# Patient Record
Sex: Male | Born: 1965
Health system: Southern US, Community
[De-identification: ages and names within clinical notes are randomized; demographics above are authoritative.]

## PROBLEM LIST (undated history)

## (undated) DIAGNOSIS — E119 Type 2 diabetes mellitus without complications: Secondary | ICD-10-CM

## (undated) DIAGNOSIS — Z0000 Encounter for general adult medical examination without abnormal findings: Secondary | ICD-10-CM

## (undated) DIAGNOSIS — T7840XA Allergy, unspecified, initial encounter: Secondary | ICD-10-CM

## (undated) DIAGNOSIS — Z840 Family history of diseases of the skin and subcutaneous tissue: Secondary | ICD-10-CM

## (undated) DIAGNOSIS — E785 Hyperlipidemia, unspecified: Secondary | ICD-10-CM

## (undated) DIAGNOSIS — M545 Low back pain: Secondary | ICD-10-CM

## (undated) DIAGNOSIS — I10 Essential (primary) hypertension: Secondary | ICD-10-CM

## (undated) DIAGNOSIS — K5190 Ulcerative colitis, unspecified, without complications: Secondary | ICD-10-CM

## (undated) DIAGNOSIS — J329 Chronic sinusitis, unspecified: Secondary | ICD-10-CM

## (undated) DIAGNOSIS — G4700 Insomnia, unspecified: Secondary | ICD-10-CM

## (undated) DIAGNOSIS — M25511 Pain in right shoulder: Secondary | ICD-10-CM

## (undated) DIAGNOSIS — N529 Male erectile dysfunction, unspecified: Secondary | ICD-10-CM

## (undated) DIAGNOSIS — D649 Anemia, unspecified: Secondary | ICD-10-CM

## (undated) HISTORY — DX: Essential (primary) hypertension: I10

## (undated) HISTORY — DX: Anemia, unspecified: D64.9

## (undated) HISTORY — DX: Low back pain: M54.5

## (undated) HISTORY — DX: Pain in right shoulder: M25.511

## (undated) HISTORY — DX: Ulcerative colitis, unspecified, without complications: K51.90

## (undated) HISTORY — DX: Chronic sinusitis, unspecified: J32.9

## (undated) HISTORY — DX: Insomnia, unspecified: G47.00

## (undated) HISTORY — DX: Male erectile dysfunction, unspecified: N52.9

## (undated) HISTORY — DX: Type 2 diabetes mellitus without complications: E11.9

## (undated) HISTORY — DX: Encounter for general adult medical examination without abnormal findings: Z00.00

## (undated) HISTORY — DX: Allergy, unspecified, initial encounter: T78.40XA

## (undated) HISTORY — DX: Hyperlipidemia, unspecified: E78.5

## (undated) HISTORY — DX: Family history of diseases of the skin and subcutaneous tissue: Z84.0

---

## 2006-06-21 LAB — HM COLONOSCOPY

## 2009-09-12 LAB — HM COLONOSCOPY

## 2009-10-15 HISTORY — PX: TRIGGER FINGER RELEASE: SHX641

## 2010-06-30 LAB — HM SIGMOIDOSCOPY

## 2011-11-30 ENCOUNTER — Ambulatory Visit: Payer: Self-pay | Admitting: Family Medicine

## 2012-07-25 ENCOUNTER — Encounter: Payer: Self-pay | Admitting: Family Medicine

## 2012-07-25 ENCOUNTER — Ambulatory Visit (INDEPENDENT_AMBULATORY_CARE_PROVIDER_SITE_OTHER): Payer: Managed Care, Other (non HMO) | Admitting: Family Medicine

## 2012-07-25 VITALS — BP 133/90 | HR 84 | Temp 97.6°F | Ht 66.0 in | Wt 200.8 lb

## 2012-07-25 DIAGNOSIS — E119 Type 2 diabetes mellitus without complications: Secondary | ICD-10-CM

## 2012-07-25 DIAGNOSIS — B354 Tinea corporis: Secondary | ICD-10-CM | POA: Insufficient documentation

## 2012-07-25 DIAGNOSIS — K5190 Ulcerative colitis, unspecified, without complications: Secondary | ICD-10-CM

## 2012-07-25 DIAGNOSIS — N529 Male erectile dysfunction, unspecified: Secondary | ICD-10-CM | POA: Insufficient documentation

## 2012-07-25 DIAGNOSIS — Z840 Family history of diseases of the skin and subcutaneous tissue: Secondary | ICD-10-CM

## 2012-07-25 DIAGNOSIS — E785 Hyperlipidemia, unspecified: Secondary | ICD-10-CM | POA: Insufficient documentation

## 2012-07-25 DIAGNOSIS — I10 Essential (primary) hypertension: Secondary | ICD-10-CM

## 2012-07-25 DIAGNOSIS — Z831 Family history of other infectious and parasitic diseases: Secondary | ICD-10-CM

## 2012-07-25 HISTORY — DX: Family history of diseases of the skin and subcutaneous tissue: Z84.0

## 2012-07-25 HISTORY — DX: Family history of other infectious and parasitic diseases: Z83.1

## 2012-07-25 MED ORDER — KETOCONAZOLE 2 % EX SHAM: MEDICATED_SHAMPOO | CUTANEOUS | Status: DC

## 2012-07-25 MED ORDER — MICONAZOLE NITRATE 2 % EX CREA: TOPICAL_CREAM | Freq: Two times a day (BID) | CUTANEOUS | Status: DC

## 2012-07-25 NOTE — Assessment & Plan Note (Signed)
Numbers are up slightly this week in the 130 and 140s, he is encouraged to avoid simple carbs this week and lean on proteins more til numbers start declining

## 2012-07-25 NOTE — Assessment & Plan Note (Signed)
Wash with Nizoral shampoo twice weekly, Miconazole derm cream apply bid til gone, may increase Fexofenadine to bid and use Benadryl qhs for the itching. May use Witch Hazel Astringent prn call if worse

## 2012-07-25 NOTE — Assessment & Plan Note (Signed)
Adequately controlled, no concerns

## 2012-07-25 NOTE — Progress Notes (Signed)
Patient ID: Curtis Kennedy, male   DOB: 30-May-1966, 46 y.o.   MRN: [id number] Curtis Kennedy [id number] [birth year] 07/25/2012      Progress Note-Follow Up  Subjective  Chief Complaint  Chief Complaint  Patient presents with  . Rash    on chest and shoulders X 2 weeks- slight itch    HPI  Patient is a 46 year old Caucasian male who is in today for urgent new patient appointment. He was scheduled to start with this practice next week but he has had a rash which continues to worsen. It again about 2 weeks ago. It started on his upper chest and back of his neck. Now it has spread onto his abdominal wall his arms he does note it occurred in any new products or change in medications and he notes when he gets hot or takes a shower it gets redder and more itchy. He has tried some antihistamines and it is not improving. Never had a similar rash before. No one in his home has a similar rash. He notes that his blood sugars are and typically he he runs 110-120 and running in the 130s and 140s.  No polyuria or polydipsia. He does have ulcerative colitis and is on Imuran without flare since this started  Past Medical History  Diagnosis Date  . Erectile dysfunction   . Diabetes mellitus without complication 46 yrs old    type 2  . Hypertension   . Hyperlipidemia   . Colitis, ulcerative   . Family history of tinea corporis 07/25/2012    Past Surgical History  Procedure Date  . Trigger finger release 2011    History reviewed. No pertinent family history.  History   Social History  . Marital Status: Married    Spouse Name: N/A    Number of Children: N/A  . Years of Education: N/A   Occupational History  . Not on file.   Social History Main Topics  . Smoking status: Never Smoker   . Smokeless tobacco: Never Used  . Alcohol Use: No  . Drug Use: No  . Sexually Active: Yes -- Male partner(s)   Other Topics Concern  . Not on file   Social History Narrative  . No narrative on  file    Current Outpatient Prescriptions on File Prior to Visit  Medication Sig Dispense Refill  . balsalazide (COLAZAL) 750 MG capsule Take 2,250 mg by mouth 3 (three) times daily.      . diphenhydrAMINE (BENADRYL) 12.5 MG/5ML elixir Take by mouth as needed.      . fenofibrate 160 MG tablet Take 160 mg by mouth daily.      Marland Kitchen glimepiride (AMARYL) 4 MG tablet Take 4 mg by mouth daily before breakfast. Take 1.5 tab      . losartan (COZAAR) 50 MG tablet Take 50 mg by mouth daily.      . metFORMIN (GLUCOPHAGE) 500 MG tablet Take 1,000 mg by mouth 2 (two) times daily with a meal.      . metoprolol succinate (TOPROL-XL) 50 MG 24 hr tablet Take 50 mg by mouth daily. Take with or immediately following a meal.      . rosuvastatin (CRESTOR) 10 MG tablet Take 10 mg by mouth daily.      . saxagliptin HCl (ONGLYZA) 5 MG TABS tablet Take 5 mg by mouth daily.      . tadalafil (CIALIS) 5 MG tablet Take 5 mg by mouth daily as needed.      . zaleplon (  SONATA) 10 MG capsule Take 10 mg by mouth at bedtime.        No Known Allergies  Review of Systems  Review of Systems  Constitutional: Positive for malaise/fatigue. Negative for fever.  HENT: Negative for congestion.   Eyes: Negative for discharge.  Respiratory: Negative for shortness of breath.   Cardiovascular: Negative for chest pain, palpitations and leg swelling.  Gastrointestinal: Negative for nausea, abdominal pain and diarrhea.  Genitourinary: Negative for dysuria.  Musculoskeletal: Negative for falls.  Skin: Positive for itching and rash.       Chest and back, itching and spreading  Neurological: Negative for loss of consciousness and headaches.  Endo/Heme/Allergies: Negative for polydipsia.  Psychiatric/Behavioral: Negative for depression and suicidal ideas. The patient is not nervous/anxious and does not have insomnia.     Objective  BP 133/90  Pulse 84  Temp 97.6 F (36.4 C) (Temporal)  Ht 5' 6"  (1.676 m)  Wt 200 lb 12.8 oz  (91.082 kg)  BMI 32.41 kg/m2  SpO2 99%  Physical Exam  Physical Exam  Constitutional: He is oriented to person, place, and time and well-developed, well-nourished, and in no distress. No distress.  HENT:  Head: Normocephalic and atraumatic.  Eyes: Conjunctivae normal are normal.  Neck: Neck supple. No thyromegaly present.  Cardiovascular: Normal rate, regular rhythm and normal heart sounds.   No murmur heard. Pulmonary/Chest: Effort normal and breath sounds normal. No respiratory distress.  Abdominal: He exhibits no distension and no mass. There is no tenderness.  Musculoskeletal: He exhibits no edema.  Neurological: He is alert and oriented to person, place, and time.  Skin: Skin is warm. Rash noted. There is erythema.          Scaly, raised rash on shoulders, chest and back,  Psychiatric: Memory, affect and judgment normal.      Assessment & Plan  Tinea corporis Wash with Nizoral shampoo twice weekly, Miconazole derm cream apply bid til gone, may increase Fexofenadine to bid and use Benadryl qhs for the itching. May use Witch Hazel Astringent prn call if worse  Hypertension Adequately controlled, no concerns  Diabetes mellitus without complication Numbers are up slightly this week in the 130 and 140s, he is encouraged to avoid simple carbs this week and lean on proteins more til numbers start declining  Colitis, ulcerative Is on Imuran for immune suppression

## 2012-07-25 NOTE — Patient Instructions (Addendum)
Ringworm, Body [Tinea Corporis] Ringworm is a fungal infection of the skin and hair. Another name for this problem is Tinea Corporis. It has nothing to do with worms. A fungus is an organism that lives on dead cells (the outer layer of skin). It can involve the entire body. It can spread from infected pets. Tinea corporis can be a problem in wrestlers who may get the infection form other players/opponents, equipment and mats. DIAGNOSIS  A skin scraping can be obtained from the affected area and by looking for fungus under the microscope. This is called a KOH examination.  HOME CARE INSTRUCTIONS   Ringworm may be treated with a topical antifungal cream, ointment, or oral medications.  If you are using a cream or ointment, wash infected skin. Dry it completely before application.  Scrub the skin with a buff puff or abrasive sponge using a shampoo with ketoconazole to remove dead skin and help treat the ringworm.  Have your pet treated by your veterinarian if it has the same infection. SEEK MEDICAL CARE IF:   Your ringworm patch (fungus) continues to spread after 7 days of treatment.  Your rash is not gone in 4 weeks. Fungal infections are slow to respond to treatment. Some redness (erythema) may remain for several weeks after the fungus is gone.  The area becomes red, warm, tender, and swollen beyond the patch. This may be a secondary bacterial (germ) infection.  You have a fever. Document Released: 09/28/2000 Document Revised: 12/24/2011 Document Reviewed: 03/11/2009 [facility] Patient Information 2013 [place].

## 2012-07-27 NOTE — Assessment & Plan Note (Signed)
Is on Imuran for immune suppression

## 2012-08-08 ENCOUNTER — Ambulatory Visit: Payer: Managed Care, Other (non HMO) | Admitting: Family Medicine

## 2012-08-21 ENCOUNTER — Encounter: Payer: Self-pay | Admitting: Family Medicine

## 2012-08-21 ENCOUNTER — Ambulatory Visit (INDEPENDENT_AMBULATORY_CARE_PROVIDER_SITE_OTHER): Payer: Managed Care, Other (non HMO) | Admitting: Family Medicine

## 2012-08-21 VITALS — BP 116/75 | HR 75 | Ht 66.0 in | Wt 200.0 lb

## 2012-08-21 DIAGNOSIS — E785 Hyperlipidemia, unspecified: Secondary | ICD-10-CM

## 2012-08-21 DIAGNOSIS — I10 Essential (primary) hypertension: Secondary | ICD-10-CM

## 2012-08-21 DIAGNOSIS — K5190 Ulcerative colitis, unspecified, without complications: Secondary | ICD-10-CM

## 2012-08-21 DIAGNOSIS — E782 Mixed hyperlipidemia: Secondary | ICD-10-CM

## 2012-08-21 DIAGNOSIS — Z23 Encounter for immunization: Secondary | ICD-10-CM

## 2012-08-21 DIAGNOSIS — E119 Type 2 diabetes mellitus without complications: Secondary | ICD-10-CM

## 2012-08-21 DIAGNOSIS — B354 Tinea corporis: Secondary | ICD-10-CM

## 2012-08-21 LAB — CBC WITH DIFFERENTIAL/PLATELET
Basophils Absolute: 0 10*3/uL (ref 0.0–0.1)
Eosinophils Absolute: 0.1 10*3/uL (ref 0.0–0.7)
Eosinophils Relative: 1.5 % (ref 0.0–5.0)
Lymphs Abs: 0.9 10*3/uL (ref 0.7–4.0)
MCHC: 33.8 g/dL (ref 30.0–36.0)
MCV: 95.6 fl (ref 78.0–100.0)
Monocytes Absolute: 0.3 10*3/uL (ref 0.1–1.0)
Neutrophils Relative %: 68.7 % (ref 43.0–77.0)
Platelets: 250 10*3/uL (ref 150.0–400.0)
RDW: 15.1 % — ABNORMAL HIGH (ref 11.5–14.6)
WBC: 4.2 10*3/uL — ABNORMAL LOW (ref 4.5–10.5)

## 2012-08-21 LAB — COMPREHENSIVE METABOLIC PANEL
ALT: 50 U/L (ref 0–53)
Albumin: 4.2 g/dL (ref 3.5–5.2)
CO2: 23 mEq/L (ref 19–32)
Chloride: 105 mEq/L (ref 96–112)
GFR: 94.57 mL/min (ref >60.00)
Glucose, Bld: 234 mg/dL — ABNORMAL HIGH (ref 70–99)
Potassium: 4.4 mEq/L (ref 3.5–5.1)
Sodium: 137 mEq/L (ref 135–145)
Total Protein: 6.9 g/dL (ref 6.0–8.3)

## 2012-08-21 LAB — LIPID PANEL: Total CHOL/HDL Ratio: 4

## 2012-08-21 LAB — TSH: TSH: 1.17 u[IU]/mL (ref 0.35–5.50)

## 2012-08-21 LAB — HEMOGLOBIN A1C: Hgb A1c MFr Bld: 9.4 % — ABNORMAL HIGH (ref 4.6–6.5)

## 2012-08-21 NOTE — Progress Notes (Signed)
Office Note 08/24/2012  CC:  Chief Complaint  Patient presents with  . Establish Care    HPI:  Curtis Kennedy is a 46 y.o. White male who is here to establish care.  He was seen for an acute visit by Dr. Charlett Blake for rash, so this is not exactly his initial visit. Patient's most recent primary MD: Dr. Melford Aase. Old records were not reviewed prior to or during today's visit.  Patient reports feeling well, no acute complaints.  Fasting gluc's 130s-140s, 2 H PP 160s.  He is vague on how often he checks his sugar.   BP checks outside of the office have been normal per his report.   Compliant with all meds.    Pt says last labs were about 6 mo ago.  He is fasting today.   Past Medical History  Diagnosis Date  . Erectile dysfunction   . Diabetes mellitus without complication 46 yrs old    type 2 (dx'd after long courses of prednisone for his UC)  . Hypertension   . Hyperlipidemia   . Colitis, ulcerative Dx'd 2001    Dr. Erlene Quan; [facility]--has been in remission since about 2009  . Family history of tinea corporis 07/25/2012    Past Surgical History  Procedure Date  . Trigger finger release 2011    No family history on file.  History   Social History  . Marital Status: Married    Spouse Name: N/A    Number of Children: N/A  . Years of Education: N/A   Occupational History  . Not on file.   Social History Main Topics  . Smoking status: Never Smoker   . Smokeless tobacco: Never Used  . Alcohol Use: No  . Drug Use: No  . Sexually Active: Yes -- Male partner(s)   Other Topics Concern  . Not on file   Social History Narrative   Married, 48 y/o girl.  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    Outpatient Encounter Prescriptions as of 08/21/2012  Medication Sig Dispense Refill  . azaTHIOprine (IMURAN) 50 MG tablet Take 50 mg by mouth  daily. Take 4.5 tabs daily      . balsalazide (COLAZAL) 750 MG capsule Take 2,250 mg by mouth 3 (three) times daily.      . fenofibrate 160 MG tablet Take 160 mg by mouth daily.      Marland Kitchen glimepiride (AMARYL) 4 MG tablet Take 4 mg by mouth daily before breakfast. Take 1.5 tab      . losartan (COZAAR) 50 MG tablet Take 50 mg by mouth daily.      . meloxicam (MOBIC) 15 MG tablet Take 15 mg by mouth daily.      . metFORMIN (GLUCOPHAGE) 500 MG tablet Take 1,000 mg by mouth 2 (two) times daily with a meal.      . metoprolol succinate (TOPROL-XL) 50 MG 24 hr tablet Take 50 mg by mouth daily. Take with or immediately following a meal.      . rosuvastatin (CRESTOR) 10 MG tablet Take 10 mg by mouth daily.      . saxagliptin HCl (ONGLYZA) 5 MG TABS tablet Take 5 mg by mouth daily.      . tadalafil (CIALIS) 5 MG tablet Take 5 mg by mouth daily as needed.      . zaleplon (SONATA) 10 MG capsule Take 10 mg by  mouth at bedtime.      . [DISCONTINUED] diphenhydrAMINE (BENADRYL) 12.5 MG/5ML elixir Take by mouth as needed.      . [DISCONTINUED] ketoconazole (NIZORAL) 2 % shampoo Apply topically 2 (two) times a week.  120 mL  0  . [DISCONTINUED] miconazole (MICOTIN) 2 % cream Apply topically 2 (two) times daily.  28.35 g  1    No Known Allergies  ROS Review of Systems  Constitutional: Negative for weight loss.  HENT: Negative for congestion and sore throat.   Eyes: Negative for pain.  Respiratory: Negative for cough and hemoptysis.   Cardiovascular: Negative for chest pain, palpitations and PND.  Gastrointestinal: Negative for nausea, vomiting and diarrhea.  Genitourinary: Negative for urgency and hematuria.  Musculoskeletal: Negative for myalgias and back pain.  Skin: Negative for rash.  Neurological: Negative for dizziness, tremors, seizures, weakness and headaches.  Endo/Heme/Allergies: Negative for polydipsia. Does not bruise/bleed easily.  Psychiatric/Behavioral: Negative for depression. The patient is  not nervous/anxious.     PE; Blood pressure 116/75, pulse 75, height 5' 6"  (1.676 m), weight 200 lb (90.719 kg), SpO2 98.00%. Gen: Alert, well appearing.  Patient is oriented to person, place, time, and situation. RRR, no m/r/g Lungs: CTA bilat, nonlabored. SKIN: upper chest wall with diffuse superficial desquamation/peeling.  NO significant erythema.  NO excoriations. FEET: DP and PT pulses 2+, no c/c/e.  Nails normal.  No significant calluses, no erythema or skin breakdown.  No sensory deficit with monofilament testing bilaterally.  Pertinent labs:  None today  ASSESSMENT AND PLAN:   New Pt: obtain old records.  Diabetes mellitus without complication Control not ideal, particularly fastings.  Check HbA1c today, normal diabetic foot exam today. Continue current med for now.  Hypertension Problem stable.  Continue current medications and diet appropriate for this condition.  We have reviewed our general long term plan for this problem and also reviewed symptoms and signs that should prompt the patient to call or return to the office. Lytes//Cr today.  Hyperlipidemia Problem stable.  Continue current medications and diet appropriate for this condition.  We have reviewed our general long term plan for this problem and also reviewed symptoms and signs that should prompt the patient to call or return to the office. Check FLP and AST/ALT.  Continue current meds.  Tinea corporis Improving with topical antifungal. Continue topical antifungal OTC bid until rash is gone x 3d.  Colitis, ulcerative Quiescent. Continue current meds and appropriate GI follow up.   Flu vaccine IM today.  An After Visit Summary was printed and given to the patient.  Return in about 4 months (around 12/19/2012) for f/u DM 2, HTN, Hyperlip.

## 2012-08-21 NOTE — Patient Instructions (Signed)
Buy OTC generic lamisil cream to apply to your rash twice daily until rash is gone for 3 days.

## 2012-08-24 NOTE — Assessment & Plan Note (Addendum)
Control not ideal, particularly fastings.  Check HbA1c today, normal diabetic foot exam today. Continue current med for now.

## 2012-08-24 NOTE — Assessment & Plan Note (Signed)
Quiescent. Continue current meds and appropriate GI follow up.

## 2012-08-24 NOTE — Assessment & Plan Note (Signed)
Problem stable.  Continue current medications and diet appropriate for this condition.  We have reviewed our general long term plan for this problem and also reviewed symptoms and signs that should prompt the patient to call or return to the office. Lytes//Cr today.

## 2012-08-24 NOTE — Assessment & Plan Note (Signed)
Problem stable.  Continue current medications and diet appropriate for this condition.  We have reviewed our general long term plan for this problem and also reviewed symptoms and signs that should prompt the patient to call or return to the office. Check FLP and AST/ALT.  Continue current meds.

## 2012-08-24 NOTE — Assessment & Plan Note (Signed)
Improving with topical antifungal. Continue topical antifungal OTC bid until rash is gone x 3d.

## 2012-08-26 ENCOUNTER — Other Ambulatory Visit: Payer: Self-pay | Admitting: *Deleted

## 2012-08-26 DIAGNOSIS — B354 Tinea corporis: Secondary | ICD-10-CM

## 2012-08-26 MED ORDER — MICONAZOLE NITRATE 2 % EX CREA: TOPICAL_CREAM | Freq: Two times a day (BID) | CUTANEOUS | Status: DC

## 2012-08-26 NOTE — Telephone Encounter (Signed)
Faxed refill request received from pharmacy for [place] filled by MD on 07/25/12 Last seen on 08/21/12 Tinea corporis  Improving with topical antifungal.  Continue topical antifungal OTC bid until rash is gone x 3d.  Message left with wife to return call to check status of rash.  RX sent.

## 2012-09-02 NOTE — Telephone Encounter (Signed)
No return call from patient.  Ending follow up.  I will enquire about rash when patient returns call regarding labs.

## 2012-09-16 ENCOUNTER — Encounter: Payer: Self-pay | Admitting: *Deleted

## 2012-12-03 ENCOUNTER — Ambulatory Visit (INDEPENDENT_AMBULATORY_CARE_PROVIDER_SITE_OTHER): Payer: Managed Care, Other (non HMO) | Admitting: Ophthalmology

## 2012-12-03 DIAGNOSIS — E1139 Type 2 diabetes mellitus with other diabetic ophthalmic complication: Secondary | ICD-10-CM

## 2012-12-03 DIAGNOSIS — H35039 Hypertensive retinopathy, unspecified eye: Secondary | ICD-10-CM

## 2012-12-03 DIAGNOSIS — H2510 Age-related nuclear cataract, unspecified eye: Secondary | ICD-10-CM

## 2012-12-03 DIAGNOSIS — E1165 Type 2 diabetes mellitus with hyperglycemia: Secondary | ICD-10-CM

## 2012-12-03 DIAGNOSIS — I10 Essential (primary) hypertension: Secondary | ICD-10-CM

## 2012-12-03 DIAGNOSIS — E11319 Type 2 diabetes mellitus with unspecified diabetic retinopathy without macular edema: Secondary | ICD-10-CM

## 2012-12-19 ENCOUNTER — Ambulatory Visit: Payer: Managed Care, Other (non HMO) | Admitting: Family Medicine

## 2013-12-07 ENCOUNTER — Ambulatory Visit (INDEPENDENT_AMBULATORY_CARE_PROVIDER_SITE_OTHER): Payer: Self-pay | Admitting: Ophthalmology

## 2013-12-07 ENCOUNTER — Ambulatory Visit (INDEPENDENT_AMBULATORY_CARE_PROVIDER_SITE_OTHER): Payer: Managed Care, Other (non HMO) | Admitting: Ophthalmology

## 2014-12-24 ENCOUNTER — Ambulatory Visit: Payer: Managed Care, Other (non HMO) | Admitting: Family Medicine

## 2015-04-14 ENCOUNTER — Ambulatory Visit: Payer: Managed Care, Other (non HMO) | Admitting: Family Medicine

## 2015-04-21 ENCOUNTER — Telehealth: Payer: Self-pay

## 2015-04-21 NOTE — Telephone Encounter (Signed)
Attempted pre visit call, pt unavailable. Will be at appointment tomorrow

## 2015-04-22 ENCOUNTER — Encounter: Payer: Self-pay | Admitting: Family Medicine

## 2015-04-22 ENCOUNTER — Ambulatory Visit (INDEPENDENT_AMBULATORY_CARE_PROVIDER_SITE_OTHER): Payer: Managed Care, Other (non HMO) | Admitting: Family Medicine

## 2015-04-22 ENCOUNTER — Other Ambulatory Visit: Payer: Self-pay | Admitting: Family Medicine

## 2015-04-22 VITALS — BP 110/62 | HR 83 | Temp 98.6°F | Ht 66.0 in | Wt 178.1 lb

## 2015-04-22 DIAGNOSIS — E119 Type 2 diabetes mellitus without complications: Secondary | ICD-10-CM

## 2015-04-22 DIAGNOSIS — Z1211 Encounter for screening for malignant neoplasm of colon: Secondary | ICD-10-CM

## 2015-04-22 DIAGNOSIS — E785 Hyperlipidemia, unspecified: Secondary | ICD-10-CM

## 2015-04-22 DIAGNOSIS — I10 Essential (primary) hypertension: Secondary | ICD-10-CM

## 2015-04-22 DIAGNOSIS — R350 Frequency of micturition: Secondary | ICD-10-CM

## 2015-04-22 DIAGNOSIS — G4700 Insomnia, unspecified: Secondary | ICD-10-CM | POA: Insufficient documentation

## 2015-04-22 DIAGNOSIS — E118 Type 2 diabetes mellitus with unspecified complications: Secondary | ICD-10-CM

## 2015-04-22 LAB — LIPID PANEL
CHOL/HDL RATIO: 4
CHOLESTEROL: 146 mg/dL (ref 0–200)
HDL: 38.5 mg/dL — ABNORMAL LOW (ref >39.00)
LDL Cholesterol: 82 mg/dL (ref 0–99)
NonHDL: 107.5
TRIGLYCERIDES: 128 mg/dL (ref 0.0–149.0)
VLDL: 25.6 mg/dL (ref 0.0–40.0)

## 2015-04-22 LAB — CBC
HCT: 42 % (ref 39.0–52.0)
HEMOGLOBIN: 14.4 g/dL (ref 13.0–17.0)
MCHC: 34.4 g/dL (ref 30.0–36.0)
MCV: 93.5 fl (ref 78.0–100.0)
Platelets: 288 10*3/uL (ref 150.0–400.0)
RBC: 4.5 Mil/uL (ref 4.22–5.81)
RDW: 13.3 % (ref 11.5–15.5)
WBC: 4.9 10*3/uL (ref 4.0–10.5)

## 2015-04-22 LAB — COMPREHENSIVE METABOLIC PANEL
ALK PHOS: 79 U/L (ref 39–117)
ALT: 46 U/L (ref 0–53)
AST: 39 U/L — ABNORMAL HIGH (ref 0–37)
Albumin: 4.2 g/dL (ref 3.5–5.2)
BILIRUBIN TOTAL: 0.6 mg/dL (ref 0.2–1.2)
BUN: 11 mg/dL (ref 6–23)
CO2: 24 mEq/L (ref 19–32)
Calcium: 9.7 mg/dL (ref 8.4–10.5)
Chloride: 104 mEq/L (ref 96–112)
Creatinine, Ser: 0.82 mg/dL (ref 0.40–1.50)
GFR: 105.48 mL/min (ref >60.00)
Glucose, Bld: 359 mg/dL — ABNORMAL HIGH (ref 70–99)
Potassium: 4.3 mEq/L (ref 3.5–5.1)
SODIUM: 137 meq/L (ref 135–145)
TOTAL PROTEIN: 7.2 g/dL (ref 6.0–8.3)

## 2015-04-22 LAB — HEMOGLOBIN A1C: Hgb A1c MFr Bld: 10.2 % — ABNORMAL HIGH (ref 4.6–6.5)

## 2015-04-22 LAB — MICROALBUMIN / CREATININE URINE RATIO
CREATININE, U: 47.3 mg/dL
MICROALB UR: 3.7 mg/dL — AB (ref 0.0–1.9)
MICROALB/CREAT RATIO: 7.8 mg/g (ref 0.0–30.0)

## 2015-04-22 LAB — PSA: PSA: 0.87 ng/mL (ref 0.10–4.00)

## 2015-04-22 LAB — TSH: TSH: 1.24 u[IU]/mL (ref 0.35–4.50)

## 2015-04-22 MED ORDER — GLIMEPIRIDE 4 MG PO TABS: 4.0000 mg | ORAL_TABLET | Freq: Two times a day (BID) | ORAL | Status: DC

## 2015-04-22 MED ORDER — ALPRAZOLAM 0.5 MG PO TABS: 0.5000 mg | ORAL_TABLET | Freq: Two times a day (BID) | ORAL | Status: DC | PRN

## 2015-04-22 NOTE — Assessment & Plan Note (Signed)
Tolerating statin, encouraged heart healthy diet, avoid trans fats, minimize simple carbs and saturated fats. Increase exercise as tolerated 

## 2015-04-22 NOTE — Assessment & Plan Note (Signed)
recheck hgba1c today.  Request old records. Referred to Dr Cruzita Lederer, endocrinology

## 2015-04-22 NOTE — Assessment & Plan Note (Signed)
No flares for past 4 1/2 years but has also not seen gastroenterology for years. Has previously seen [facility] in [place] but would like to switch to [facility], will place referral today.

## 2015-04-22 NOTE — Assessment & Plan Note (Signed)
Well controlled, no changes to meds. Encouraged heart healthy diet such as the DASH diet and exercise as tolerated.  °

## 2015-04-22 NOTE — Patient Instructions (Signed)

## 2015-04-22 NOTE — Assessment & Plan Note (Signed)
Uses Sonata on a typical night and Xanax only when he is overly stressed at work and his mind will not quiet down. Encouraged good sleep hygiene such as dark, quiet room. No blue/green glowing lights such as computer screens in bedroom. No alcohol or stimulants in evening. Cut down on caffeine as able. Regular exercise is helpful but not just prior to bed time.

## 2015-04-22 NOTE — Progress Notes (Signed)
Pre visit review using our clinic review tool, if applicable. No additional management support is needed unless otherwise documented below in the visit note. 

## 2015-04-25 ENCOUNTER — Encounter: Payer: Self-pay | Admitting: Family Medicine

## 2015-04-26 ENCOUNTER — Ambulatory Visit: Payer: Managed Care, Other (non HMO) | Admitting: Physician Assistant

## 2015-04-28 ENCOUNTER — Ambulatory Visit (INDEPENDENT_AMBULATORY_CARE_PROVIDER_SITE_OTHER): Payer: Managed Care, Other (non HMO) | Admitting: Medical

## 2015-04-28 ENCOUNTER — Encounter: Payer: Self-pay | Admitting: Medical

## 2015-04-28 ENCOUNTER — Other Ambulatory Visit: Payer: Self-pay | Admitting: Medical

## 2015-04-28 VITALS — BP 132/81 | HR 88 | Temp 97.8°F | Ht 66.0 in | Wt 180.8 lb

## 2015-04-28 DIAGNOSIS — R2230 Localized swelling, mass and lump, unspecified upper limb: Secondary | ICD-10-CM | POA: Insufficient documentation

## 2015-04-28 DIAGNOSIS — R2231 Localized swelling, mass and lump, right upper limb: Secondary | ICD-10-CM

## 2015-04-28 NOTE — Patient Instructions (Addendum)
Axillary mass I called our radiology service and we can't do that studies here. Will need to be at breast center. Both mammogram and rt axillary [place]. This is the protocol per scheduling.  Our referral staff will contact you on procedure date.  Cbc may be done later again in event lymph node found and mammogram negative.     Follow up here as needed before if any changing signs or symptoms.

## 2015-04-28 NOTE — Assessment & Plan Note (Addendum)
I called our radiology service and we can't do that studies here. Will need to be at breast center. Both mammogram and rt axillary [place]. This is the protocol per scheduling.  Our referral staff will contact you on procedure date.  Cbc may be done later again in event lymph node found and mammogram negative.

## 2015-04-28 NOTE — Progress Notes (Signed)
Pre visit review using our clinic review tool, if applicable. No additional management support is needed unless otherwise documented below in the visit note. 

## 2015-04-28 NOTE — Progress Notes (Signed)
Subjective:    Patient ID: Curtis Kennedy, male    DOB: [birth year], 49 y.o.   MRN: [id number]  HPI  Pt states just this past Friday he was showering and felt lump/mass under rt axillary area. Hurts just little bit. No fever, no chills, no sweats. No rash. No regional source of infection reported.  Pt states/suspects this is new since never felt before.  Purposeful weight loss about 60 pounds in 2 yrs. Last year maybe 40 lb purposeful weight loss. So pt think maybe fat in area prevented him from feeling this area in question.      Review of Systems  Constitutional: Negative for fever, chills and fatigue.  HENT: Negative for congestion, dental problem, postnasal drip and rhinorrhea.   Respiratory: Negative for cough, choking, chest tightness, shortness of breath and wheezing.   Cardiovascular: Negative for chest pain and palpitations.  Gastrointestinal: Negative for abdominal pain.  Neurological: Negative for dizziness, light-headedness and numbness.  Hematological: Negative for adenopathy. Does not bruise/bleed easily.       Maybe lymph node rt axillary area.   Past Medical History  Diagnosis Date  . Erectile dysfunction   . Diabetes mellitus without complication 49 yrs old    type 2 (dx'd after long courses of prednisone for his UC)  . Hypertension   . Hyperlipidemia   . Colitis, ulcerative Dx'd 2001    Dr. Erlene Quan; [facility]--has been in remission since about 2009  . Family history of tinea corporis 07/25/2012  . Insomnia     History   Social History  . Marital Status: Married    Spouse Name: N/A  . Number of Children: N/A  . Years of Education: N/A   Occupational History  . Not on file.   Social History Main Topics  . Smoking status: Never Smoker   . Smokeless tobacco: Never Used  . Alcohol Use: No  . Drug Use: No  . 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 Not on file    Social History Narrative   Married, 51 y/o girl.     Lives in [place], grew up in [place]--relocated to [facility] 2007.   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          Past Surgical History  Procedure Laterality Date  . Trigger finger release  2011    b/l middle fingers    Family History  Problem Relation Age of Onset  . Diabetes Father   . Hyperlipidemia Father   . Hypertension Father   . Kidney disease Father     Kidney Failure  . Heart disease Father 92    MI  . Hypertension Sister   . Hypertension Brother   . Diabetes Paternal Grandfather   . Hypertension Brother   . Hyperlipidemia Brother   . Heart disease Brother     Valve Surgery ?  . Diabetes Brother   . Hypertension Brother   . ADD / ADHD Brother     Allergies  Allergen Reactions  . Simvastatin     Myalgias    Current Outpatient Prescriptions on File Prior to Visit  Medication Sig Dispense Refill  . ALPRAZolam (XANAX) 0.5 MG tablet Take 1 tablet (0.5 mg total) by mouth 2 (two) times daily as needed for anxiety. 60 tablet 0  .  azaTHIOprine (IMURAN) 50 MG tablet Take 50 mg by mouth daily. Take 4.5 tabs daily    . balsalazide (COLAZAL) 750 MG capsule Take 2,250 mg by mouth 3 (three) times daily.    . busPIRone (BUSPAR) 5 MG tablet Take 5 mg by mouth 2 (two) times daily.    . Dulaglutide (TRULICITY) 1.5 BW/6.2MB SOPN Inject 1.5 mLs into the skin once a week.    . fenofibrate 160 MG tablet Take 160 mg by mouth daily.    Marland Kitchen glimepiride (AMARYL) 4 MG tablet Take 1 tablet (4 mg total) by mouth 2 (two) times daily. 60 tablet 3  . losartan (COZAAR) 50 MG tablet Take 50 mg by mouth daily.    . meloxicam (MOBIC) 15 MG tablet Take 15 mg by mouth daily.    . metFORMIN (GLUCOPHAGE) 500 MG tablet Take 1,000 mg by mouth 2 (two) times daily with a meal.    . metoprolol succinate (TOPROL-XL) 50 MG 24 hr tablet Take 50 mg by mouth daily. Take with or  immediately following a meal.    . Pitavastatin Calcium (LIVALO) 4 MG TABS Take by mouth daily.    . tadalafil (CIALIS) 5 MG tablet Take 5 mg by mouth daily as needed.    . traMADol (ULTRAM) 50 MG tablet Take by mouth as needed.    . zaleplon (SONATA) 10 MG capsule Take 10 mg by mouth at bedtime.     No current facility-administered medications on file prior to visit.    BP 132/81 mmHg  Pulse 88  Temp(Src) 97.8 F (36.6 C) (Oral)  Ht 5' 6"  (1.676 m)  Wt 180 lb 12.8 oz (82.01 kg)  BMI 29.20 kg/m2  SpO2 100%       Objective:   Physical Exam  General- No acute distress. Pleasant patient. Neck- Full range of motion, no jvd Lungs- Clear, even and unlabored. Heart- regular rate and rhythm. Neurologic- CNII- XII grossly intact.  Rt axillary area- 2.5 cm x 1cm approximate mass. Lipoma vs lymph node. Lymphatic exam- on palpation could not appreciate any other potential lymphadnenopathy. Skin- no rash.  Chest/breast- on palpation of his pecs/breast no lumps. No a lot of flat(typical male chest).      Assessment & Plan:

## 2015-05-01 NOTE — Progress Notes (Signed)
Curtis Kennedy  [id number] 05/29/1966 05/01/2015      Progress Note-Follow Up  Subjective  Chief Complaint  Chief Complaint  Patient presents with  . Establish Care    HPI  Patient is a 49 y.o. male in today for routine medical care. Patient is in today to establish care. He had an episode of food poisoning last week with nausea and diarrhea. He feels much better today. Denies any fevers or persistent abdominal discomfort. He notes that he previously has been seen by endocrinology but is in need of a new endocrinologist at this time. Reports his last A1c was 10.2 and his best has been and 8. Acknowledges some urinary frequency. Other than the food poisoning no recent illness. Was diagnosed with ulcerative colitis at age 33 but has been very well controlled. Besides diabetes he also struggles with hypertension and high cholesterol which she reports of also been well-controlled. Denies CP/palp/SOB/HA/congestion/fevers or GU c/o. Taking meds as prescribed  Past Medical History  Diagnosis Date  . Erectile dysfunction   . Diabetes mellitus without complication 49 yrs old    type 2 (dx'd after long courses of prednisone for his UC)  . Hypertension   . Hyperlipidemia   . Colitis, ulcerative Dx'd 2001    Dr. Erlene Quan; [facility]--has been in remission since about 2009  . Family history of tinea corporis 07/25/2012  . Insomnia     Past Surgical History  Procedure Laterality Date  . Trigger finger release  2011    b/l middle fingers    Family History  Problem Relation Age of Onset  . Diabetes Father   . Hyperlipidemia Father   . Hypertension Father   . Kidney disease Father     Kidney Failure  . Heart disease Father 71    MI  . Hypertension Sister   . Hypertension Brother   . Diabetes Paternal Grandfather   . Hypertension Brother   . Hyperlipidemia Brother   . Heart disease Brother     Valve Surgery ?  . Diabetes Brother   . Hypertension Brother   . ADD / ADHD Brother      History   Social History  . Marital Status: Married    Spouse Name: N/A  . Number of Children: N/A  . Years of Education: N/A   Occupational History  . Not on file.   Social History Main Topics  . Smoking status: Never Smoker   . Smokeless tobacco: Never Used  . Alcohol Use: No  . Drug Use: No  . 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 Not on file   Social History Narrative   Married, 51 y/o girl.     Lives in [place], grew up in [place]--relocated to [facility] 2007.   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          Current Outpatient Prescriptions on File Prior to Visit  Medication Sig Dispense Refill  . azaTHIOprine (IMURAN) 50 MG tablet Take 50 mg by mouth daily. Take 4.5 tabs daily    . balsalazide (COLAZAL) 750 MG capsule Take 2,250 mg by mouth 3 (three) times daily.    . fenofibrate 160 MG tablet Take 160 mg by mouth daily.    Marland Kitchen losartan (COZAAR) 50 MG tablet Take 50 mg by mouth daily.    Marland Kitchen  meloxicam (MOBIC) 15 MG tablet Take 15 mg by mouth daily.    . metFORMIN (GLUCOPHAGE) 500 MG tablet Take 1,000 mg by mouth 2 (two) times daily with a meal.    . metoprolol succinate (TOPROL-XL) 50 MG 24 hr tablet Take 50 mg by mouth daily. Take with or immediately following a meal.    . tadalafil (CIALIS) 5 MG tablet Take 5 mg by mouth daily as needed.    . zaleplon (SONATA) 10 MG capsule Take 10 mg by mouth at bedtime.     No current facility-administered medications on file prior to visit.    Allergies  Allergen Reactions  . Simvastatin     Myalgias    Review of Systems  Review of Systems  Constitutional: Negative for fever, chills and malaise/fatigue.  HENT: Negative for congestion, hearing loss and nosebleeds.   Eyes: Negative for discharge.  Respiratory: Negative for cough, sputum production, shortness of  breath and wheezing.   Cardiovascular: Negative for chest pain, palpitations and leg swelling.  Gastrointestinal: Positive for nausea and diarrhea. Negative for heartburn, vomiting, abdominal pain, constipation and blood in stool.       Food poisoning last week with n/v/d now resolved  Genitourinary: Positive for frequency. Negative for dysuria, urgency and hematuria.  Musculoskeletal: Negative for myalgias, back pain and falls.  Skin: Negative for rash.  Neurological: Negative for dizziness, tremors, sensory change, focal weakness, loss of consciousness, weakness and headaches.  Endo/Heme/Allergies: Negative for polydipsia. Does not bruise/bleed easily.  Psychiatric/Behavioral: Negative for depression and suicidal ideas. The patient is not nervous/anxious and does not have insomnia.     Objective  BP 110/62 mmHg  Pulse 83  Temp(Src) 98.6 F (37 C) (Oral)  Ht 5' 6"  (1.676 m)  Wt 178 lb 2 oz (80.797 kg)  BMI 28.76 kg/m2  SpO2 97%  Physical Exam  Physical Exam  Constitutional: He is oriented to person, place, and time and well-developed, well-nourished, and in no distress. No distress.  HENT:  Head: Normocephalic and atraumatic.  Eyes: Conjunctivae are normal.  Neck: Neck supple. No thyromegaly present.  Cardiovascular: Normal rate, regular rhythm and normal heart sounds.   No murmur heard. Pulmonary/Chest: Effort normal and breath sounds normal. No respiratory distress.  Abdominal: He exhibits no distension and no mass. There is no tenderness.  Musculoskeletal: He exhibits no edema.  Neurological: He is alert and oriented to person, place, and time.  Skin: Skin is warm.  Psychiatric: Memory, affect and judgment normal.    Lab Results  Component Value Date   TSH 1.24 04/22/2015   Lab Results  Component Value Date   WBC 4.9 04/22/2015   HGB 14.4 04/22/2015   HCT 42.0 04/22/2015   MCV 93.5 04/22/2015   PLT 288.0 04/22/2015   Lab Results  Component Value Date    CREATININE 0.82 04/22/2015   BUN 11 04/22/2015   NA 137 04/22/2015   K 4.3 04/22/2015   CL 104 04/22/2015   CO2 24 04/22/2015   Lab Results  Component Value Date   ALT 46 04/22/2015   AST 39* 04/22/2015   ALKPHOS 79 04/22/2015   BILITOT 0.6 04/22/2015   Lab Results  Component Value Date   CHOL 146 04/22/2015   Lab Results  Component Value Date   HDL 38.50* 04/22/2015   Lab Results  Component Value Date   LDLCALC 82 04/22/2015   Lab Results  Component Value Date   TRIG 128.0 04/22/2015   Lab Results  Component Value  Date   CHOLHDL 4 04/22/2015     Assessment & Plan  Diabetes mellitus without complication recheck [id number] today.  Request old records. Referred to Dr Cruzita Lederer, endocrinology  Insomnia Uses Sonata on a typical night and Xanax only when he is overly stressed at work and his mind will not quiet down. Encouraged good sleep hygiene such as dark, quiet room. No blue/green glowing lights such as computer screens in bedroom. No alcohol or stimulants in evening. Cut down on caffeine as able. Regular exercise is helpful but not just prior to bed time.   Colitis, ulcerative No flares for past 4 1/2 years but has also not seen gastroenterology for years. Has previously seen [facility] [place] but would like to switch to [facility], will place referral today.   Hyperlipidemia Tolerating statin, encouraged heart healthy diet, avoid trans fats, minimize simple carbs and saturated fats. Increase exercise as tolerated  Hypertension Well controlled, no changes to meds. Encouraged heart healthy diet such as the DASH diet and exercise as tolerated.

## 2015-05-02 ENCOUNTER — Ambulatory Visit
Admission: RE | Admit: 2015-05-02 | Discharge: 2015-05-02 | Disposition: A | Discharge status: Home / Self Care | Payer: Managed Care, Other (non HMO) | Source: Ambulatory Visit | Attending: Medical | Admitting: Medical

## 2015-05-02 DIAGNOSIS — R2231 Localized swelling, mass and lump, right upper limb: Secondary | ICD-10-CM

## 2015-05-10 ENCOUNTER — Ambulatory Visit: Payer: Self-pay | Admitting: Family Medicine

## 2015-05-10 ENCOUNTER — Ambulatory Visit (INDEPENDENT_AMBULATORY_CARE_PROVIDER_SITE_OTHER): Payer: Managed Care, Other (non HMO) | Admitting: Family Medicine

## 2015-05-10 ENCOUNTER — Encounter: Payer: Self-pay | Admitting: Family Medicine

## 2015-05-10 VITALS — BP 110/78 | HR 81 | Temp 98.5°F | Ht 66.0 in | Wt 179.0 lb

## 2015-05-10 DIAGNOSIS — N649 Disorder of breast, unspecified: Secondary | ICD-10-CM

## 2015-05-10 DIAGNOSIS — E119 Type 2 diabetes mellitus without complications: Secondary | ICD-10-CM | POA: Diagnosis not present

## 2015-05-10 DIAGNOSIS — G4700 Insomnia, unspecified: Secondary | ICD-10-CM

## 2015-05-10 DIAGNOSIS — R2231 Localized swelling, mass and lump, right upper limb: Secondary | ICD-10-CM

## 2015-05-10 DIAGNOSIS — E785 Hyperlipidemia, unspecified: Secondary | ICD-10-CM | POA: Diagnosis not present

## 2015-05-10 MED ORDER — INSULIN REGULAR HUMAN 100 UNIT/ML IJ SOLN: 5.0000 [IU] | Freq: Three times a day (TID) | INTRAMUSCULAR | Status: DC

## 2015-05-10 NOTE — Progress Notes (Signed)
Pre visit review using our clinic review tool, if applicable. No additional management support is needed unless otherwise documented below in the visit note. 

## 2015-05-10 NOTE — Patient Instructions (Signed)

## 2015-05-10 NOTE — Progress Notes (Signed)
Curtis Kennedy  [id number] 03-21-1966 05/10/2015      Progress Note-Follow Up  Subjective  Chief Complaint  Chief Complaint  Patient presents with  . Results    HPI  Patient is a 49 y.o. male in today for routine medical care. Patient is in today accompanied by h They acknowledge a low blood sugar and a high of 336. They have had financial difficulty, trouble getting his medication. Acknowledges polyuria and polydipsia. Follows with podiatry. No recent illness. Denies CP/palp/SOB/HA/congestion/fevers/GI or GU c/o. Taking meds as prescribed  Past Medical History  Diagnosis Date  . Erectile dysfunction   . Diabetes mellitus without complication 49 yrs old    type 2 (dx'd after long courses of prednisone for his UC)  . Hypertension   . Hyperlipidemia   . Colitis, ulcerative Dx'd 2001    Dr. Erlene Quan; [facility]--has been in remission since about 2009  . Family history of tinea corporis 07/25/2012  . Insomnia     Past Surgical History  Procedure Laterality Date  . Trigger finger release  2011    b/l middle fingers    Family History  Problem Relation Age of Onset  . Diabetes Father   . Hyperlipidemia Father   . Hypertension Father   . Kidney disease Father     Kidney Failure  . Heart disease Father 80    MI  . Hypertension Sister   . Hypertension Brother   . Diabetes Paternal Grandfather   . Hypertension Brother   . Hyperlipidemia Brother   . Heart disease Brother     Valve Surgery ?  . Diabetes Brother   . Hypertension Brother   . ADD / ADHD Brother     History   Social History  . Marital Status: Married    Spouse Name: N/A  . Number of Children: N/A  . Years of Education: N/A   Occupational History  . Not on file.   Social History Main Topics  . Smoking status: Never Smoker   . Smokeless tobacco: Never Used  . Alcohol Use: No  . Drug Use: No  . 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 Not on file   Social History Narrative   Married, 51 y/o girl.     Lives in [place], grew up in [place]--relocated to [facility] 2007.   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          Current Outpatient Prescriptions on File Prior to Visit  Medication Sig Dispense Refill  . ALPRAZolam (XANAX) 0.5 MG tablet Take 1 tablet (0.5 mg total) by mouth 2 (two) times daily as needed for anxiety. 60 tablet 0  . azaTHIOprine (IMURAN) 50 MG tablet Take 50 mg by mouth daily. Take 4.5 tabs daily    . balsalazide (COLAZAL) 750 MG capsule Take 2,250 mg by mouth 3 (three) times daily.    . busPIRone (BUSPAR) 5 MG tablet Take 5 mg by mouth 2 (two) times daily.    . Dulaglutide (TRULICITY) 1.5 HA/1.9FX SOPN Inject 1.5 mLs into the skin once a week.    . fenofibrate 160 MG tablet Take 160 mg by mouth daily.    Marland Kitchen glimepiride (AMARYL) 4 MG tablet Take 1 tablet (4 mg total) by mouth 2 (two) times daily. 60 tablet 3  .  losartan (COZAAR) 50 MG tablet Take 50 mg by mouth daily.    . meloxicam (MOBIC) 15 MG tablet Take 15 mg by mouth daily.    . metFORMIN (GLUCOPHAGE) 500 MG tablet Take 1,000 mg by mouth 2 (two) times daily with a meal.    . metoprolol succinate (TOPROL-XL) 50 MG 24 hr tablet Take 50 mg by mouth daily. Take with or immediately following a meal.    . Pitavastatin Calcium (LIVALO) 4 MG TABS Take by mouth daily.    . tadalafil (CIALIS) 5 MG tablet Take 5 mg by mouth daily as needed.    . traMADol (ULTRAM) 50 MG tablet Take by mouth as needed.    . zaleplon (SONATA) 10 MG capsule Take 10 mg by mouth at bedtime.     No current facility-administered medications on file prior to visit.    Allergies  Allergen Reactions  . Simvastatin     Myalgias    Review of Systems  Review of Systems  Constitutional: Negative for fever and malaise/fatigue.  HENT: Negative for congestion.   Eyes: Negative for  discharge.  Respiratory: Negative for shortness of breath.   Cardiovascular: Negative for chest pain, palpitations and leg swelling.  Gastrointestinal: Negative for nausea, abdominal pain and diarrhea.  Genitourinary: Positive for frequency. Negative for dysuria.  Musculoskeletal: Positive for joint pain. Negative for falls.  Skin: Negative for rash.  Neurological: Negative for loss of consciousness and headaches.  Endo/Heme/Allergies: Negative for polydipsia.  Psychiatric/Behavioral: Negative for depression and suicidal ideas. The patient is not nervous/anxious and does not have insomnia.     Objective  BP 110/78 mmHg  Pulse 81  Temp(Src) 98.5 F (36.9 C) (Oral)  Ht 5' 6"  (1.676 m)  Wt 179 lb (81.194 kg)  BMI 28.91 kg/m2  SpO2 96%  Physical Exam  Physical Exam  Constitutional: He is oriented to person, place, and time and well-developed, well-nourished, and in no distress. No distress.  HENT:  Head: Normocephalic and atraumatic.  Eyes: Conjunctivae are normal.  Neck: Neck supple. No thyromegaly present.  Cardiovascular: Normal rate, regular rhythm and normal heart sounds.   No murmur heard. Pulmonary/Chest: Effort normal and breath sounds normal. No respiratory distress.  Abdominal: He exhibits no distension and no mass. There is no tenderness.  Musculoskeletal: He exhibits no edema.  Neurological: He is alert and oriented to person, place, and time.  Skin: Skin is warm.  Psychiatric: Memory, affect and judgment normal.    Lab Results  Component Value Date   TSH 1.24 04/22/2015   Lab Results  Component Value Date   WBC 4.9 04/22/2015   HGB 14.4 04/22/2015   HCT 42.0 04/22/2015   MCV 93.5 04/22/2015   PLT 288.0 04/22/2015   Lab Results  Component Value Date   CREATININE 0.82 04/22/2015   BUN 11 04/22/2015   NA 137 04/22/2015   K 4.3 04/22/2015   CL 104 04/22/2015   CO2 24 04/22/2015   Lab Results  Component Value Date   ALT 46 04/22/2015   AST 39*  04/22/2015   ALKPHOS 79 04/22/2015   BILITOT 0.6 04/22/2015   Lab Results  Component Value Date   CHOL 146 04/22/2015   Lab Results  Component Value Date   HDL 38.50* 04/22/2015   Lab Results  Component Value Date   LDLCALC 82 04/22/2015   Lab Results  Component Value Date   TRIG 128.0 04/22/2015   Lab Results  Component Value Date   CHOLHDL 4 04/22/2015  Assessment & Plan  Hypertension Well controlled, no changes to meds. Encouraged heart healthy diet such as the DASH diet and exercise as tolerated.   Hyperlipidemia Tolerating statin, encouraged heart healthy diet, avoid trans fats, minimize simple carbs and saturated fats. Increase exercise as tolerated  Diabetes mellitus without complication [id number] unacceptable, minimize simple carbs. Increase exercise as tolerated. Continue current meds but he has been out due to financial concerns, follows with endocrinology, will try and help him find an insulin he can afford til he sees endocrinology  Insomnia Encouraged good sleep hygiene such as dark, quiet room. No blue/green glowing lights such as computer screens in bedroom. No alcohol or stimulants in evening. Cut down on caffeine as able. Regular exercise is helpful but not just prior to bed time.   Axillary mass Imaging with benign results, no discomfort will continue to monitor

## 2015-05-10 NOTE — Assessment & Plan Note (Signed)
Well controlled, no changes to meds. Encouraged heart healthy diet such as the DASH diet and exercise as tolerated.  °

## 2015-05-22 DIAGNOSIS — N649 Disorder of breast, unspecified: Secondary | ICD-10-CM | POA: Insufficient documentation

## 2015-05-22 NOTE — Assessment & Plan Note (Signed)
Imaging with benign results, no discomfort will continue to monitor

## 2015-05-22 NOTE — Assessment & Plan Note (Addendum)
hgba1c unacceptable, minimize simple carbs. Increase exercise as tolerated. Continue current meds but he has been out due to financial concerns, follows with endocrinology, will try and help him find an insulin he can afford til he sees endocrinology

## 2015-05-22 NOTE — Assessment & Plan Note (Signed)
Tolerating statin, encouraged heart healthy diet, avoid trans fats, minimize simple carbs and saturated fats. Increase exercise as tolerated 

## 2015-05-22 NOTE — Assessment & Plan Note (Signed)
Encouraged good sleep hygiene such as dark, quiet room. No blue/green glowing lights such as computer screens in bedroom. No alcohol or stimulants in evening. Cut down on caffeine as able. Regular exercise is helpful but not just prior to bed time.  

## 2015-05-23 ENCOUNTER — Telehealth: Payer: Self-pay | Admitting: Family Medicine

## 2015-05-23 NOTE — Telephone Encounter (Signed)
I did not call them please see if you can sort out who called themr

## 2015-05-23 NOTE — Telephone Encounter (Signed)
Caller name: Orlando Art Wolden  Relationship to patient: Spouse  Can be reached: [phone]  Pharmacy:  Reason for call: pt's wife is returning your call.

## 2015-05-24 NOTE — Telephone Encounter (Signed)
Received fax from [facility] in [place], stating that Relion insulin does not come as a pen.  Stated Humulin does, and does patient prefer a relion vial or humulin pen? PCP responded to check with the patient his preference as both insulin good. The patient(spoke to his wife Curtis Kennedy) preferred a pen, but when I called the pharmacy to inform that stated humulin R does not come in a pen and patient would have to use a vial. I then called the patient back to inform, had to leave a detailed message.

## 2015-06-27 ENCOUNTER — Encounter: Payer: Self-pay | Admitting: Internal Medicine

## 2015-06-27 ENCOUNTER — Ambulatory Visit: Payer: Managed Care, Other (non HMO) | Admitting: Internal Medicine

## 2015-07-18 ENCOUNTER — Other Ambulatory Visit: Payer: Managed Care, Other (non HMO)

## 2015-07-25 ENCOUNTER — Ambulatory Visit: Payer: Managed Care, Other (non HMO) | Admitting: Family Medicine

## 2015-08-16 ENCOUNTER — Other Ambulatory Visit: Payer: Managed Care, Other (non HMO)

## 2015-08-18 ENCOUNTER — Other Ambulatory Visit (INDEPENDENT_AMBULATORY_CARE_PROVIDER_SITE_OTHER): Payer: Managed Care, Other (non HMO)

## 2015-08-18 DIAGNOSIS — E785 Hyperlipidemia, unspecified: Secondary | ICD-10-CM

## 2015-08-18 DIAGNOSIS — I10 Essential (primary) hypertension: Secondary | ICD-10-CM

## 2015-08-18 DIAGNOSIS — E118 Type 2 diabetes mellitus with unspecified complications: Secondary | ICD-10-CM | POA: Diagnosis not present

## 2015-08-18 LAB — COMPREHENSIVE METABOLIC PANEL
ALT: 38 U/L (ref 0–53)
AST: 23 U/L (ref 0–37)
Albumin: 4.2 g/dL (ref 3.5–5.2)
Alkaline Phosphatase: 72 U/L (ref 39–117)
BUN: 9 mg/dL (ref 6–23)
CO2: 27 meq/L (ref 19–32)
Calcium: 9.6 mg/dL (ref 8.4–10.5)
Chloride: 101 mEq/L (ref 96–112)
Creatinine, Ser: 0.89 mg/dL (ref 0.40–1.50)
GFR: 95.84 mL/min (ref >60.00)
Glucose, Bld: 416 mg/dL — ABNORMAL HIGH (ref 70–99)
Potassium: 4.2 mEq/L (ref 3.5–5.1)
Sodium: 137 mEq/L (ref 135–145)
Total Bilirubin: 1.3 mg/dL — ABNORMAL HIGH (ref 0.2–1.2)
Total Protein: 7.3 g/dL (ref 6.0–8.3)

## 2015-08-18 LAB — LIPID PANEL
CHOL/HDL RATIO: 4
Cholesterol: 146 mg/dL (ref 0–200)
HDL: 40.3 mg/dL (ref >39.00)
LDL Cholesterol: 87 mg/dL (ref 0–99)
NONHDL: 105.83
Triglycerides: 95 mg/dL (ref 0.0–149.0)
VLDL: 19 mg/dL (ref 0.0–40.0)

## 2015-08-18 LAB — CBC
HCT: 41 % (ref 39.0–52.0)
Hemoglobin: 14.1 g/dL (ref 13.0–17.0)
MCHC: 34.3 g/dL (ref 30.0–36.0)
MCV: 94.9 fl (ref 78.0–100.0)
PLATELETS: 236 10*3/uL (ref 150.0–400.0)
RBC: 4.32 Mil/uL (ref 4.22–5.81)
RDW: 15.6 % — AB (ref 11.5–15.5)
WBC: 9.4 10*3/uL (ref 4.0–10.5)

## 2015-08-18 LAB — TSH: TSH: 2.17 u[IU]/mL (ref 0.35–4.50)

## 2015-08-18 LAB — HEMOGLOBIN A1C: Hgb A1c MFr Bld: 10.3 % — ABNORMAL HIGH (ref 4.6–6.5)

## 2015-08-22 ENCOUNTER — Ambulatory Visit (INDEPENDENT_AMBULATORY_CARE_PROVIDER_SITE_OTHER): Payer: Managed Care, Other (non HMO) | Admitting: Family Medicine

## 2015-08-22 ENCOUNTER — Encounter: Payer: Self-pay | Admitting: Family Medicine

## 2015-08-22 VITALS — BP 122/84 | HR 90 | Temp 98.4°F | Ht 66.0 in | Wt 179.5 lb

## 2015-08-22 DIAGNOSIS — K5180 Other ulcerative colitis without complications: Secondary | ICD-10-CM

## 2015-08-22 DIAGNOSIS — E119 Type 2 diabetes mellitus without complications: Secondary | ICD-10-CM

## 2015-08-22 DIAGNOSIS — I10 Essential (primary) hypertension: Secondary | ICD-10-CM

## 2015-08-22 DIAGNOSIS — B001 Herpesviral vesicular dermatitis: Secondary | ICD-10-CM

## 2015-08-22 DIAGNOSIS — J329 Chronic sinusitis, unspecified: Secondary | ICD-10-CM

## 2015-08-22 DIAGNOSIS — E785 Hyperlipidemia, unspecified: Secondary | ICD-10-CM

## 2015-08-22 MED ORDER — VALACYCLOVIR HCL 1 G PO TABS: 1000.0000 mg | ORAL_TABLET | Freq: Two times a day (BID) | ORAL | Status: DC | PRN

## 2015-08-22 MED ORDER — CEFDINIR 300 MG PO CAPS: 300.0000 mg | ORAL_CAPSULE | Freq: Two times a day (BID) | ORAL | Status: AC

## 2015-08-22 MED ORDER — HYDROCODONE-HOMATROPINE 5-1.5 MG/5ML PO SYRP
5.0000 mL | ORAL_SOLUTION | Freq: Three times a day (TID) | ORAL | Status: DC | PRN
Start: 2015-08-22 — End: 2016-06-28

## 2015-08-22 NOTE — Progress Notes (Signed)
Pre visit review using our clinic review tool, if applicable. No additional management support is needed unless otherwise documented below in the visit note. 

## 2015-08-22 NOTE — Patient Instructions (Signed)
[place]es, 10 strain probiotic 1 cap daily or OTC Digestive [employer] or [employer] (online Luckyvitamins.com)   Sinusitis, Adult Sinusitis is redness, soreness, and inflammation of the paranasal sinuses. Paranasal sinuses are air pockets within the bones of your face. They are located beneath your eyes, in the middle of your forehead, and above your eyes. In healthy paranasal sinuses, mucus is able to drain out, and air is able to circulate through them by way of your nose. However, when your paranasal sinuses are inflamed, mucus and air can become trapped. This can allow bacteria and other germs to grow and cause infection. Sinusitis can develop quickly and last only a short time (acute) or continue over a long period (chronic). Sinusitis that lasts for more than 12 weeks is considered chronic. CAUSES Causes of sinusitis include:  Allergies.  Structural abnormalities, such as displacement of the cartilage that separates your nostrils (deviated septum), which can decrease the air flow through your nose and sinuses and affect sinus drainage.  Functional abnormalities, such as when the small hairs (cilia) that line your sinuses and help remove mucus do not work properly or are not present. SIGNS AND SYMPTOMS Symptoms of acute and chronic sinusitis are the same. The primary symptoms are pain and pressure around the affected sinuses. Other symptoms include:  Upper toothache.  Earache.  Headache.  Bad breath.  Decreased sense of smell and taste.  A cough, which worsens when you are lying flat.  Fatigue.  Fever.  Thick drainage from your nose, which often is green and may contain pus (purulent).  Swelling and warmth over the affected sinuses. DIAGNOSIS Your health care provider will perform a physical exam. During your exam, your health care provider may perform any of the following to help determine if you have acute sinusitis or chronic sinusitis:  Look in your nose  for signs of abnormal growths in your nostrils (nasal polyps).  Tap over the affected sinus to check for signs of infection.  View the inside of your sinuses using an imaging device that has a light attached (endoscope). If your health care provider suspects that you have chronic sinusitis, one or more of the following tests may be recommended:  Allergy tests.  Nasal culture. A sample of mucus is taken from your nose, sent to a lab, and screened for bacteria.  Nasal cytology. A sample of mucus is taken from your nose and examined by your health care provider to determine if your sinusitis is related to an allergy. TREATMENT Most cases of acute sinusitis are related to a viral infection and will resolve on their own within 10 days. Sometimes, medicines are prescribed to help relieve symptoms of both acute and chronic sinusitis. These may include pain medicines, decongestants, nasal steroid sprays, or saline sprays. However, for sinusitis related to a bacterial infection, your health care provider will prescribe antibiotic medicines. These are medicines that will help kill the bacteria causing the infection. Rarely, sinusitis is caused by a fungal infection. In these cases, your health care provider will prescribe antifungal medicine. For some cases of chronic sinusitis, surgery is needed. Generally, these are cases in which sinusitis recurs more than 3 times per year, despite other treatments. HOME CARE INSTRUCTIONS  Drink plenty of water. Water helps thin the mucus so your sinuses can drain more easily.  Use a humidifier.  Inhale steam 3-4 times a day (for example, sit in the bathroom with the shower running).  Apply a warm, moist washcloth to your  face 3-4 times a day, or as directed by your health care provider.  Use saline nasal sprays to help moisten and clean your sinuses.  Take medicines only as directed by your health care provider.  If you were prescribed either an antibiotic  or antifungal medicine, finish it all even if you start to feel better. SEEK IMMEDIATE MEDICAL CARE IF:  You have increasing pain or severe headaches.  You have nausea, vomiting, or drowsiness.  You have swelling around your face.  You have vision problems.  You have a stiff neck.  You have difficulty breathing.   This information is not intended to replace advice given to you by your health care provider. Make sure you discuss any questions you have with your health care provider.   Document Released: 10/01/2005 Document Revised: 10/22/2014 Document Reviewed: 10/16/2011 Elsevier Interactive Patient Education 2016 [place].  Mucinex twice Encouraged increased rest and hydration 64 oz, robiotics, zinc such as Coldeze or Xicam. Treat fevers as needed. Vitamin C 500 to 1000 mg daily Aged or black Garlic

## 2015-08-28 ENCOUNTER — Encounter: Payer: Self-pay | Admitting: Family Medicine

## 2015-08-28 DIAGNOSIS — J329 Chronic sinusitis, unspecified: Secondary | ICD-10-CM | POA: Insufficient documentation

## 2015-08-28 HISTORY — DX: Chronic sinusitis, unspecified: J32.9

## 2015-08-28 NOTE — Assessment & Plan Note (Signed)
Well controlled, no changes to meds. Encouraged heart healthy diet such as the DASH diet and exercise as tolerated.  °

## 2015-08-28 NOTE — Assessment & Plan Note (Addendum)
hgba1c unacceptable, minimize simple carbs. Increase exercise as tolerated. Continue current meds

## 2015-08-28 NOTE — Assessment & Plan Note (Signed)
Stable on current meds. No changes

## 2015-08-28 NOTE — Assessment & Plan Note (Signed)
Tolerating statin, encouraged heart healthy diet, avoid trans fats, minimize simple carbs and saturated fats. Increase exercise as tolerated 

## 2015-08-28 NOTE — Assessment & Plan Note (Signed)
Started on Cefdinir, Mucinex and probiotics

## 2015-08-28 NOTE — Progress Notes (Signed)
Subjective:    Patient ID: Curtis Kennedy, male    DOB: 02-25-1966, 49 y.o.   MRN: [id number]  Chief Complaint  Patient presents with  . Follow-up    HPI Patient is in today for follow-up. Is struggling with sinus symptoms for about 2 weeks now. Reports headache and facial pressure.  Reports postnasal drip and green phlegm. Has some ear pressure and pain as well as malaise and myalgias. Symptoms are worse at night. Denies polyuria or polydipsia. Is requesting a refill on Valtrex in case this viral lesions flare. Denies CP/palp/SOB/GI or GU c/o. Taking meds as prescribed  Past Medical History  Diagnosis Date  . Erectile dysfunction   . Diabetes mellitus without complication ([place]) 49 yrs old    type 2 (dx'd after long courses of prednisone for his UC)  . Hypertension   . Hyperlipidemia   . Colitis, ulcerative ([place]) Dx'd 2001    Dr. Erlene Quan; [facility]--has been in remission since about 2009  . Family history of tinea corporis 07/25/2012  . Insomnia   . Sinusitis 08/28/2015    Past Surgical History  Procedure Laterality Date  . Trigger finger release  2011    b/l middle fingers    Family History  Problem Relation Age of Onset  . Diabetes Father   . Hyperlipidemia Father   . Hypertension Father   . Kidney disease Father     Kidney Failure  . Heart disease Father 51    MI  . Hypertension Sister   . Hypertension Brother   . Diabetes Paternal Grandfather   . Hypertension Brother   . Hyperlipidemia Brother   . Heart disease Brother     Valve Surgery ?  . Diabetes Brother   . Hypertension Brother   . ADD / ADHD Brother     Social History   Social History  . Marital Status: Married    Spouse Name: N/A  . Number of Children: N/A  . Years of Education: N/A   Occupational History  . Not on file.   Social History Main Topics  . Smoking status: Never Smoker   . Smokeless tobacco: Never Used  . Alcohol Use: No  . Drug Use: No  . Sexual Activity:    Partners:  Female     Comment: lives with wife, does office work follows with   Other Topics Concern  . Not on file   Social History Narrative   Married, 51 y/o girl.     Lives in [place], grew up in [place]--relocated to [facility] 2007.   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          Outpatient Prescriptions Prior to Visit  Medication Sig Dispense Refill  . ALPRAZolam (XANAX) 0.5 MG tablet Take 1 tablet (0.5 mg total) by mouth 2 (two) times daily as needed for anxiety. 60 tablet 0  . azaTHIOprine (IMURAN) 50 MG tablet Take 50 mg by mouth daily. Take 4.5 tabs daily    . balsalazide (COLAZAL) 750 MG capsule Take 2,250 mg by mouth 3 (three) times daily.    . busPIRone (BUSPAR) 5 MG tablet Take 5 mg by mouth 2 (two) times daily.    . Dulaglutide (TRULICITY) 1.5 FX/9.0WI SOPN Inject 1.5 mLs into the skin once a week.    . fenofibrate 160 MG tablet Take 160 mg by mouth daily.    Marland Kitchen glimepiride (AMARYL) 4 MG tablet Take  1 tablet (4 mg total) by mouth 2 (two) times daily. 60 tablet 3  . insulin regular (NOVOLIN R,HUMULIN R) 100 units/mL injection Inject 0.05-0.1 mLs (5-10 Units total) into the skin 3 (three) times daily before meals. 10 mL 11  . losartan (COZAAR) 50 MG tablet Take 50 mg by mouth daily.    . meloxicam (MOBIC) 15 MG tablet Take 15 mg by mouth daily.    . metFORMIN (GLUCOPHAGE) 500 MG tablet Take 1,000 mg by mouth 2 (two) times daily with a meal.    . metoprolol succinate (TOPROL-XL) 50 MG 24 hr tablet Take 50 mg by mouth daily. Take with or immediately following a meal.    . Pitavastatin Calcium (LIVALO) 4 MG TABS Take by mouth daily.    . tadalafil (CIALIS) 5 MG tablet Take 5 mg by mouth daily as needed.    . traMADol (ULTRAM) 50 MG tablet Take by mouth as needed.    . zaleplon (SONATA) 10 MG capsule Take 10 mg by mouth at bedtime.     No facility-administered medications prior to visit.    Allergies  Allergen  Reactions  . Simvastatin     Myalgias    Review of Systems  Constitutional: Positive for malaise/fatigue. Negative for fever.  HENT: Positive for congestion and ear pain.   Eyes: Negative for photophobia and discharge.  Respiratory: Negative for shortness of breath.   Cardiovascular: Positive for chest pain. Negative for palpitations and leg swelling.  Gastrointestinal: Negative for nausea and abdominal pain.  Genitourinary: Negative for dysuria.  Musculoskeletal: Negative for falls.  Skin: Negative for rash.  Neurological: Positive for headaches. Negative for loss of consciousness.  Endo/Heme/Allergies: Negative for environmental allergies.  Psychiatric/Behavioral: Negative for depression. The patient is not nervous/anxious.        Objective:    Physical Exam  Constitutional: He is oriented to person, place, and time. He appears well-developed and well-nourished. No distress.  HENT:  Head: Normocephalic and atraumatic.  Nose: Nose normal.  Eyes: Right eye exhibits no discharge. Left eye exhibits no discharge.  Neck: Normal range of motion. Neck supple.  Cardiovascular: Normal rate and regular rhythm.   No murmur heard. Pulmonary/Chest: Effort normal and breath sounds normal.  Abdominal: Soft. Bowel sounds are normal. There is no tenderness.  Musculoskeletal: He exhibits no edema.  Neurological: He is alert and oriented to person, place, and time.  Skin: Skin is warm and dry.  Psychiatric: He has a normal mood and affect.  Nursing note and vitals reviewed.   BP 122/84 mmHg  Pulse 90  Temp(Src) 98.4 F (36.9 C) (Oral)  Ht 5' 6"  (1.676 m)  Wt 179 lb 8 oz (81.421 kg)  BMI 28.99 kg/m2  SpO2 98% Wt Readings from Last 3 Encounters:  08/22/15 179 lb 8 oz (81.421 kg)  05/10/15 179 lb (81.194 kg)  04/28/15 180 lb 12.8 oz (82.01 kg)     Lab Results  Component Value Date   WBC 9.4 08/18/2015   HGB 14.1 08/18/2015   HCT 41.0 08/18/2015   PLT 236.0 08/18/2015    GLUCOSE 416* 08/18/2015   CHOL 146 08/18/2015   TRIG 95.0 08/18/2015   HDL 40.30 08/18/2015   LDLCALC 87 08/18/2015   ALT 38 08/18/2015   AST 23 08/18/2015   NA 137 08/18/2015   K 4.2 08/18/2015   CL 101 08/18/2015   CREATININE 0.89 08/18/2015   BUN 9 08/18/2015   CO2 27 08/18/2015   TSH 2.17 08/18/2015  PSA 0.87 04/22/2015   HGBA1C 10.3* 08/18/2015   MICROALBUR 3.7* 04/22/2015    Lab Results  Component Value Date   TSH 2.17 08/18/2015   Lab Results  Component Value Date   WBC 9.4 08/18/2015   HGB 14.1 08/18/2015   HCT 41.0 08/18/2015   MCV 94.9 08/18/2015   PLT 236.0 08/18/2015   Lab Results  Component Value Date   NA 137 08/18/2015   K 4.2 08/18/2015   CO2 27 08/18/2015   GLUCOSE 416* 08/18/2015   BUN 9 08/18/2015   CREATININE 0.89 08/18/2015   BILITOT 1.3* 08/18/2015   ALKPHOS 72 08/18/2015   AST 23 08/18/2015   ALT 38 08/18/2015   PROT 7.3 08/18/2015   ALBUMIN 4.2 08/18/2015   CALCIUM 9.6 08/18/2015   GFR 95.84 08/18/2015   Lab Results  Component Value Date   CHOL 146 08/18/2015   Lab Results  Component Value Date   HDL 40.30 08/18/2015   Lab Results  Component Value Date   LDLCALC 87 08/18/2015   Lab Results  Component Value Date   TRIG 95.0 08/18/2015   Lab Results  Component Value Date   CHOLHDL 4 08/18/2015   Lab Results  Component Value Date   HGBA1C 10.3* 08/18/2015       Assessment & Plan:   Problem List Items Addressed This Visit    Colitis, ulcerative ([place])    Stable on current meds. No changes      Diabetes mellitus without complication ([facility])    FUXN2T unacceptable, minimize simple carbs. Increase exercise as tolerated. Continue current meds      Hyperlipidemia    Tolerating statin, encouraged heart healthy diet, avoid trans fats, minimize simple carbs and saturated fats. Increase exercise as tolerated      Hypertension    Well controlled, no changes to meds. Encouraged heart healthy diet such as the DASH diet  and exercise as tolerated.       Sinusitis    Started on Cefdinir, Mucinex and probiotics      Relevant Medications   HYDROcodone-homatropine (HYCODAN) 5-1.5 MG/5ML syrup   cefdinir (OMNICEF) 300 MG capsule   valACYclovir (VALTREX) 1000 MG tablet    Other Visit Diagnoses    Cold sore    -  Primary    Relevant Medications    HYDROcodone-homatropine (HYCODAN) 5-1.5 MG/5ML syrup    cefdinir (OMNICEF) 300 MG capsule    valACYclovir (VALTREX) 1000 MG tablet       I am having Mr. Gaxiola start on HYDROcodone-homatropine, cefdinir, and valACYclovir. I am also having him maintain his azaTHIOprine, balsalazide, fenofibrate, losartan, meloxicam, metFORMIN, metoprolol succinate, tadalafil, zaleplon, Pitavastatin Calcium, busPIRone, Dulaglutide, traMADol, ALPRAZolam, glimepiride, and insulin regular.  Meds ordered this encounter  Medications  . HYDROcodone-homatropine (HYCODAN) 5-1.5 MG/5ML syrup    Sig: Take 5 mLs by mouth every 8 (eight) hours as needed for cough.    Dispense:  120 mL    Refill:  0  . cefdinir (OMNICEF) 300 MG capsule    Sig: Take 1 capsule (300 mg total) by mouth 2 (two) times daily.    Dispense:  20 capsule    Refill:  0  . valACYclovir (VALTREX) 1000 MG tablet    Sig: Take 1 tablet (1,000 mg total) by mouth 2 (two) times daily as needed.    Dispense:  30 tablet    Refill:  1     Penni Homans, MD

## 2015-08-31 ENCOUNTER — Encounter: Payer: Self-pay | Admitting: Internal Medicine

## 2015-08-31 ENCOUNTER — Ambulatory Visit (INDEPENDENT_AMBULATORY_CARE_PROVIDER_SITE_OTHER): Payer: Managed Care, Other (non HMO) | Admitting: Internal Medicine

## 2015-08-31 VITALS — BP 118/62 | HR 85 | Temp 98.1°F | Resp 12 | Ht 66.0 in | Wt 179.6 lb

## 2015-08-31 DIAGNOSIS — E1165 Type 2 diabetes mellitus with hyperglycemia: Secondary | ICD-10-CM

## 2015-08-31 DIAGNOSIS — IMO0001 Reserved for inherently not codable concepts without codable children: Secondary | ICD-10-CM

## 2015-08-31 MED ORDER — INSULIN PEN NEEDLE 32G X 4 MM MISC: Status: DC

## 2015-08-31 MED ORDER — INSULIN DETEMIR 100 UNIT/ML FLEXPEN: 15.0000 [IU] | PEN_INJECTOR | Freq: Every day | SUBCUTANEOUS | Status: DC

## 2015-08-31 MED ORDER — DULAGLUTIDE 1.5 MG/0.5ML ~~LOC~~ SOAJ: 1.5000 mL | SUBCUTANEOUS | Status: DC

## 2015-08-31 NOTE — Progress Notes (Signed)
Patient ID: Curtis Kennedy, male   DOB: April 07, 1966, 49 y.o.   MRN: [id number]  HPI: Curtis Kennedy is a 49 y.o.-year-old male, referred by his PCP, Dr. Charlett Blake, for management of DM2, dx in 2001, non-insulin-dependent, uncontrolled, without complications. Previous endocrinologist: Dr Susette Racer.  Last hemoglobin A1c was: Lab Results  Component Value Date   HGBA1C 10.3* 08/18/2015   HGBA1C 10.2* 04/22/2015   HGBA1C 9.4* 08/21/2012  5 years ago: 6.5%. He used steroids for ulcerative colitis in the past, which she believes triggered his diabetes.  Pt is on a regimen of: - Metformin 1000 mg 2x a day, with meals - Amaryl 4 x2 mg in am - Trulicity 1.5 mg weekly >> started 09/2014 - wife injects it. He has needle phobia. He tried Invokana >> burning with urination >> could not tolerate it.   Pt is not checking sugars, only rarely: - am: 280-300s - 2h after b'fast: n/c - before lunch: n/c - 2h after lunch: 175-210 - before dinner: n/c - 2h after dinner: n/c - bedtime: n/c - nighttime: n/c No lows. Lowest sugar was 150; he has hypoglycemia awareness at 60.  Highest sugar was 416.  Glucometer: ? No pbs with fingersticks  Pt's meals are: - Breakfast: Oatmeal, English muffin, cup of coffee - Lunch: Sandwich, chips, diet drink or water - Dinner: Tacos, casseroles, salad, chicken - Snacks: 1-3 a day  No regular exercise.  - no CKD, last BUN/creatinine:  Lab Results  Component Value Date   BUN 9 08/18/2015   CREATININE 0.89 08/18/2015  On losartan. - last set of lipids: Lab Results  Component Value Date   CHOL 146 08/18/2015   HDL 40.30 08/18/2015   LDLCALC 87 08/18/2015   TRIG 95.0 08/18/2015   CHOLHDL 4 08/18/2015  On Pitavastatin. - last eye exam was in 2015. No DR.  - no numbness and tingling in his feet. Foot exam: 08/22/2015. Has a podiatrist.  Pt has FH of DM in brother, father, PGF.  Has HL, HTN, UC - no recent steroids.  ROS: Constitutional: no weight  gain/loss, no fatigue, no subjective hyperthermia/hypothermia, + nocturia Eyes: + blurry vision, no xerophthalmia ENT: no sore throat, no nodules palpated in throat, no dysphagia/odynophagia, no hoarseness Cardiovascular: no CP/SOB/palpitations/leg swelling Respiratory: no cough/SOB Gastrointestinal: no N/V/D/C Musculoskeletal: no muscle/joint aches Skin: no rashes Neurological: no tremors/numbness/tingling/dizziness Psychiatric: no depression/anxiety  Past Medical History  Diagnosis Date  . Erectile dysfunction   . Diabetes mellitus without complication ([place]) 49 yrs old    type 2 (dx'd after long courses of prednisone for his UC)  . Hypertension   . Hyperlipidemia   . Colitis, ulcerative ([place]) Dx'd 2001    Dr. Erlene Quan; [facility]--has been in remission since about 2009  . Family history of tinea corporis 07/25/2012  . Insomnia   . Sinusitis 08/28/2015   Past Surgical History  Procedure Laterality Date  . Trigger finger release  2011    b/l middle fingers   Social History   Social History  . Marital Status: Married    Spouse Name: N/A  . Number of Children: 1   Occupational History  . 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 Smoking status: Never Smoker   . Smokeless tobacco: Never Used  . Alcohol Use: No  . Drug Use: No   Social History Narrative   Married, 1 girl     Lives in [place], grew up in [place]--relocated to [facility] 2007.   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    Current Outpatient Prescriptions on File Prior to Visit  Medication Sig Dispense Refill  . ALPRAZolam (XANAX) 0.5 MG tablet Take 1 tablet (0.5 mg total) by mouth 2 (two) times daily as needed for anxiety. 60 tablet 0  . azaTHIOprine (IMURAN) 50 MG tablet Take 50 mg by mouth daily. Take 4.5 tabs daily    . balsalazide (COLAZAL) 750 MG capsule Take 2,250 mg by mouth 3 (three) times daily.    . busPIRone (BUSPAR) 5 MG tablet  Take 5 mg by mouth 2 (two) times daily.    . cefdinir (OMNICEF) 300 MG capsule Take 1 capsule (300 mg total) by mouth 2 (two) times daily. 20 capsule 0  . Dulaglutide (TRULICITY) 1.5 QJ/1.9ER SOPN Inject 1.5 mLs into the skin once a week.    . fenofibrate 160 MG tablet Take 160 mg by mouth daily.    Marland Kitchen glimepiride (AMARYL) 4 MG tablet Take 1 tablet (4 mg total) by mouth 2 (two) times daily. 60 tablet 3  . HYDROcodone-homatropine (HYCODAN) 5-1.5 MG/5ML syrup Take 5 mLs by mouth every 8 (eight) hours as needed for cough. 120 mL 0  . insulin regular (NOVOLIN R,HUMULIN R) 100 units/mL injection Inject 0.05-0.1 mLs (5-10 Units total) into the skin 3 (three) times daily before meals. 10 mL 11  . losartan (COZAAR) 50 MG tablet Take 50 mg by mouth daily.    . meloxicam (MOBIC) 15 MG tablet Take 15 mg by mouth daily.    . metFORMIN (GLUCOPHAGE) 500 MG tablet Take 1,000 mg by mouth 2 (two) times daily with a meal.    . metoprolol succinate (TOPROL-XL) 50 MG 24 hr tablet Take 50 mg by mouth daily. Take with or immediately following a meal.    . Pitavastatin Calcium (LIVALO) 4 MG TABS Take by mouth daily.    . tadalafil (CIALIS) 5 MG tablet Take 5 mg by mouth daily as needed.    . traMADol (ULTRAM) 50 MG tablet Take by mouth as needed.    . valACYclovir (VALTREX) 1000 MG tablet Take 1 tablet (1,000 mg total) by mouth 2 (two) times daily as needed. 30 tablet 1  . zaleplon (SONATA) 10 MG capsule Take 10 mg by mouth at bedtime.     No current facility-administered medications on file prior to visit.   Allergies  Allergen Reactions  . Simvastatin     Myalgias   Family History  Problem Relation Age of Onset  . Diabetes Father   . Hyperlipidemia Father   . Hypertension Father   . Kidney disease Father     Kidney Failure  . Heart disease Father 80    MI  . Hypertension Sister   . Hypertension Brother   . Diabetes Paternal Grandfather   . Hypertension Brother   . Hyperlipidemia Brother   . Heart  disease Brother     Valve Surgery ?  . Diabetes Brother   . Hypertension Brother   . ADD / ADHD Brother     PE: BP 118/62 mmHg  Pulse 85  Temp(Src) 98.1 F (36.7 C) (Oral)  Resp 12  Ht 5' 6"  (1.676 m)  Wt 179 lb 9.6 oz (81.466 kg)  BMI 29.00 kg/m2  SpO2 97% Wt Readings from Last 3 Encounters:  08/31/15 179 lb 9.6 oz (81.466 kg)  08/22/15 179 lb 8 oz (81.421 kg)  05/10/15 179 lb (81.194 kg)  Constitutional: overweight, in NAD Eyes: PERRLA, EOMI, no exophthalmos ENT: moist mucous membranes, no thyromegaly, no cervical lymphadenopathy Cardiovascular: RRR, No MRG Respiratory: CTA B Gastrointestinal: abdomen soft, NT, ND, BS+ Musculoskeletal: no deformities, strength intact in all 4 Skin: moist, warm, no rashes Neurological: no tremor with outstretched hands, DTR normal in all 4  ASSESSMENT: 1. DM2, insulin-dependent, uncontrolled, without complications  PLAN:  1. Patient with long-standing, uncontrolled diabetes, on oral antidiabetic regimen + GLP R agonist, which became insufficient. Sugars in the 300s. We discussed about splitting Amaryl in 2 doses: before b'fast and before dinner, but this will not be enough >> We discussed to add long acting insulin. He has a needle phobia but thinks he can manage the inj's if he has insulin pens and if his wife gives him the inj's. Will use Levemir as his insurance approved this. We discussed proper injection techniques:  Preference for the abdominal sq tissue  Rotation of sites  Change needle for each injection  Keep needle in for 10 sec after last unit of insulin in - we also discussed about proper diet and gave him specific examples about how he can improve his meals. - I suggested to:  Patient Instructions  Please continue: - Metformin 1000 mg 2x a day with meals. - Trulicity 1.5 mg once a week   Split Amaryl in 4 mg 2x a day, before b'fast and before dinner.  Start Levemir 15 units at bedtime. If sugars in am are not <150  in 3 days, please increase the dose to 18 units.  When injecting insulin:  Inject in the abdomen  Rotate the injection sites around the belly button  Change needle for each injection  Keep needle in for 10 sec after last unit of insulin in  Keep the insulin in use out of the fridge  Please return in 1.5 months with your sugar log.   - Strongly advised him to start checking sugars at different times of the day - check 2 times a day, rotating checks - given sugar log and advised how to fill it and to bring it at next appt  - given foot care handout and explained the principles  - given instructions for hypoglycemia management "15-15 rule"  - advised for yearly eye exams  - Return to clinic in 1.5 mo with sugar log

## 2015-08-31 NOTE — Patient Instructions (Addendum)
Please continue: - Metformin 1000 mg 2x a day with meals. - Trulicity 1.5 mg once a week   Split Amaryl in 4 mg 2x a day, before b'fast and before dinner.  Start Levemir 15 units at bedtime. If sugars in am are not <150 in 3 days, please increase the dose to 18 units.  When injecting insulin:  Inject in the abdomen  Rotate the injection sites around the belly button  Change needle for each injection  Keep needle in for 10 sec after last unit of insulin in  Keep the insulin in use out of the fridge  Please return in 1.5 months with your sugar log.   PATIENT INSTRUCTIONS FOR TYPE 2 DIABETES:  **Please join MyChart!** - see attached instructions about how to join if you have not done so already.  DIET AND EXERCISE Diet and exercise is an important part of diabetic treatment.  We recommended aerobic exercise in the form of brisk walking (working between 40-60% of maximal aerobic capacity, similar to brisk walking) for 150 minutes per week (such as 30 minutes five days per week) along with 3 times per week performing 'resistance' training (using various gauge rubber tubes with handles) 5-10 exercises involving the major muscle groups (upper body, lower body and core) performing 10-15 repetitions (or near fatigue) each exercise. Start at half the above goal but build slowly to reach the above goals. If limited by weight, joint pain, or disability, we recommend daily walking in a swimming pool with water up to waist to reduce pressure from joints while allow for adequate exercise.    BLOOD GLUCOSES Monitoring your blood glucoses is important for continued management of your diabetes. Please check your blood glucoses 2-4 times a day: fasting, before meals and at bedtime (you can rotate these measurements - e.g. one day check before the 3 meals, the next day check before 2 of the meals and before bedtime, etc.).   HYPOGLYCEMIA (low blood sugar) Hypoglycemia is usually a reaction to not  eating, exercising, or taking too much insulin/ other diabetes drugs.  Symptoms include tremors, sweating, hunger, confusion, headache, etc. Treat IMMEDIATELY with 15 grams of Carbs: . 4 glucose tablets .  cup regular juice/soda . 2 tablespoons raisins . 4 teaspoons sugar . 1 tablespoon honey Recheck blood glucose in 15 mins and repeat above if still symptomatic/blood glucose <100.  RECOMMENDATIONS TO REDUCE YOUR RISK OF DIABETIC COMPLICATIONS: * Take your prescribed MEDICATION(S) * Follow a DIABETIC diet: Complex carbs, fiber rich foods, (monounsaturated and polyunsaturated) fats * AVOID saturated/trans fats, high fat foods, >2,300 mg salt per day. * EXERCISE at least 5 times a week for 30 minutes or preferably daily.  * DO NOT SMOKE OR DRINK more than 1 drink a day. * Check your FEET every day. Do not wear tightfitting shoes. Contact us if you develop an ulcer * See your EYE doctor once a year or more if needed * Get a FLU shot once a year * Get a PNEUMONIA vaccine once before and once after age 63 years  GOALS:  * Your Hemoglobin A1c of <7%  * fasting sugars need to be <130 * after meals sugars need to be <180 (2h after you start eating) * Your Systolic BP should be 629 or lower  * Your Diastolic BP should be 80 or lower  * Your HDL (Good Cholesterol) should be 40 or higher  * Your LDL (Bad Cholesterol) should be 100 or lower. * Your Triglycerides should be  150 or lower  * Your Urine microalbumin (kidney function) should be <30 * Your Body Mass Index should be 25 or lower    Please consider the following ways to cut down carbs and fat and increase fiber and micronutrients in your diet: - substitute whole grain for white bread or pasta - substitute brown rice for white rice - substitute 90-calorie flat bread pieces for slices of bread when possible - substitute sweet potatoes or yams for white potatoes - substitute humus for margarine - substitute tofu for cheese when  possible - substitute almond or rice milk for regular milk (would not drink soy milk daily due to concern for soy estrogen influence on breast cancer risk) - substitute dark chocolate for other sweets when possible - substitute water - can add lemon or orange slices for taste - for diet sodas (artificial sweeteners will trick your body that you can eat sweets without getting calories and will lead you to overeating and weight gain in the long run) - do not skip breakfast or other meals (this will slow down the metabolism and will result in more weight gain over time)  - can try smoothies made from fruit and almond/rice milk in am instead of regular breakfast - can also try old-fashioned (not instant) oatmeal made with almond/rice milk in am - order the dressing on the side when eating salad at a restaurant (pour less than half of the dressing on the salad) - eat as little meat as possible - can try juicing, but should not forget that juicing will get rid of the fiber, so would alternate with eating raw veg./fruits or drinking smoothies - use as little oil as possible, even when using olive oil - can dress a salad with a mix of balsamic vinegar and lemon juice, for e.g. - use agave nectar, stevia sugar, or regular sugar rather than artificial sweateners - steam or broil/roast veggies  - snack on veggies/fruit/nuts (unsalted, preferably) when possible, rather than processed foods - reduce or eliminate aspartame in diet (it is in diet sodas, chewing gum, etc) Read the labels!  Try to read Dr. Janene Harvey book: "Program for Reversing Diabetes" for other ideas for healthy eating.

## 2015-09-18 ENCOUNTER — Encounter: Payer: Self-pay | Admitting: Internal Medicine

## 2015-09-19 ENCOUNTER — Other Ambulatory Visit: Payer: Self-pay | Admitting: *Deleted

## 2015-09-19 MED ORDER — DULAGLUTIDE 1.5 MG/0.5ML ~~LOC~~ SOAJ
1.5000 mL | SUBCUTANEOUS | Status: DC
Start: 2015-09-19 — End: 2016-06-28

## 2015-09-19 NOTE — Telephone Encounter (Signed)
Refill request via MyChart.

## 2015-11-01 ENCOUNTER — Telehealth: Payer: Self-pay | Admitting: Family Medicine

## 2015-11-01 MED ORDER — GLIMEPIRIDE 4 MG PO TABS
4.0000 mg | ORAL_TABLET | Freq: Two times a day (BID) | ORAL | Status: DC
Start: 2015-11-01 — End: 2015-11-24

## 2015-11-01 NOTE — Telephone Encounter (Signed)
Relation to PE:LGKB Call back number:[phone] Pharmacy: [id number] - [place], [place] [phone] ([phone] (Fax)         Reason for call:  Patient requesting a refill glimepiride (AMARYL) 4 MG tablet

## 2015-11-02 ENCOUNTER — Ambulatory Visit: Payer: Managed Care, Other (non HMO) | Admitting: Internal Medicine

## 2015-11-24 ENCOUNTER — Ambulatory Visit (INDEPENDENT_AMBULATORY_CARE_PROVIDER_SITE_OTHER): Payer: Managed Care, Other (non HMO) | Admitting: Family Medicine

## 2015-11-24 ENCOUNTER — Encounter: Payer: Self-pay | Admitting: Family Medicine

## 2015-11-24 VITALS — BP 120/82 | HR 85 | Temp 97.9°F | Ht 66.0 in | Wt 181.2 lb

## 2015-11-24 DIAGNOSIS — I10 Essential (primary) hypertension: Secondary | ICD-10-CM

## 2015-11-24 DIAGNOSIS — E785 Hyperlipidemia, unspecified: Secondary | ICD-10-CM

## 2015-11-24 DIAGNOSIS — J329 Chronic sinusitis, unspecified: Secondary | ICD-10-CM

## 2015-11-24 DIAGNOSIS — E1165 Type 2 diabetes mellitus with hyperglycemia: Secondary | ICD-10-CM | POA: Diagnosis not present

## 2015-11-24 DIAGNOSIS — IMO0001 Reserved for inherently not codable concepts without codable children: Secondary | ICD-10-CM

## 2015-11-24 LAB — COMPREHENSIVE METABOLIC PANEL
ALK PHOS: 60 U/L (ref 39–117)
ALT: 56 U/L — AB (ref 0–53)
AST: 47 U/L — ABNORMAL HIGH (ref 0–37)
Albumin: 4.3 g/dL (ref 3.5–5.2)
BILIRUBIN TOTAL: 1 mg/dL (ref 0.2–1.2)
BUN: 10 mg/dL (ref 6–23)
CALCIUM: 9.7 mg/dL (ref 8.4–10.5)
CO2: 27 meq/L (ref 19–32)
Chloride: 102 mEq/L (ref 96–112)
Creatinine, Ser: 0.82 mg/dL (ref 0.40–1.50)
GFR: 105.23 mL/min (ref >60.00)
GLUCOSE: 364 mg/dL — AB (ref 70–99)
Potassium: 4.6 mEq/L (ref 3.5–5.1)
Sodium: 135 mEq/L (ref 135–145)
Total Protein: 7.4 g/dL (ref 6.0–8.3)

## 2015-11-24 LAB — TSH: TSH: 1.53 u[IU]/mL (ref 0.35–4.50)

## 2015-11-24 LAB — LIPID PANEL
CHOLESTEROL: 130 mg/dL (ref 0–200)
HDL: 41.1 mg/dL (ref >39.00)
LDL CALC: 69 mg/dL (ref 0–99)
NonHDL: 89.13
Total CHOL/HDL Ratio: 3
Triglycerides: 103 mg/dL (ref 0.0–149.0)
VLDL: 20.6 mg/dL (ref 0.0–40.0)

## 2015-11-24 LAB — CBC
HEMATOCRIT: 41.6 % (ref 39.0–52.0)
HEMOGLOBIN: 14.3 g/dL (ref 13.0–17.0)
MCHC: 34.5 g/dL (ref 30.0–36.0)
MCV: 96.6 fl (ref 78.0–100.0)
PLATELETS: 218 10*3/uL (ref 150.0–400.0)
RBC: 4.31 Mil/uL (ref 4.22–5.81)
RDW: 15.2 % (ref 11.5–15.5)
WBC: 4.3 10*3/uL (ref 4.0–10.5)

## 2015-11-24 LAB — HEMOGLOBIN A1C: Hgb A1c MFr Bld: 10.4 % — ABNORMAL HIGH (ref 4.6–6.5)

## 2015-11-24 LAB — HIV ANTIBODY (ROUTINE TESTING W REFLEX): HIV: NONREACTIVE

## 2015-11-24 LAB — MICROALBUMIN / CREATININE URINE RATIO
CREATININE, U: 50.6 mg/dL
MICROALB/CREAT RATIO: 14.4 mg/g (ref 0.0–30.0)
Microalb, Ur: 7.3 mg/dL — ABNORMAL HIGH (ref 0.0–1.9)

## 2015-11-24 LAB — HEPATITIS C ANTIBODY: HCV Ab: NEGATIVE

## 2015-11-24 MED ORDER — GLIMEPIRIDE 4 MG PO TABS: 4.0000 mg | ORAL_TABLET | Freq: Two times a day (BID) | ORAL | Status: DC

## 2015-11-24 MED ORDER — LOSARTAN POTASSIUM 50 MG PO TABS: 50.0000 mg | ORAL_TABLET | Freq: Every day | ORAL | Status: DC

## 2015-11-24 MED ORDER — BALSALAZIDE DISODIUM 750 MG PO CAPS: 2250.0000 mg | ORAL_CAPSULE | Freq: Three times a day (TID) | ORAL | Status: DC

## 2015-11-24 MED ORDER — FENOFIBRATE 160 MG PO TABS: 160.0000 mg | ORAL_TABLET | Freq: Every day | ORAL | Status: DC

## 2015-11-24 MED ORDER — AZATHIOPRINE 50 MG PO TABS: 50.0000 mg | ORAL_TABLET | Freq: Every day | ORAL | Status: DC

## 2015-11-24 MED ORDER — METFORMIN HCL 500 MG PO TABS: 1000.0000 mg | ORAL_TABLET | Freq: Two times a day (BID) | ORAL | Status: DC

## 2015-11-24 MED ORDER — PITAVASTATIN CALCIUM 4 MG PO TABS: 1.0000 | ORAL_TABLET | Freq: Every day | ORAL | Status: DC

## 2015-11-24 MED ORDER — CEFDINIR 300 MG PO CAPS: 300.0000 mg | ORAL_CAPSULE | Freq: Two times a day (BID) | ORAL | Status: DC

## 2015-11-24 MED ORDER — METOPROLOL SUCCINATE ER 50 MG PO TB24: 50.0000 mg | ORAL_TABLET | Freq: Every day | ORAL | Status: DC

## 2015-11-24 MED ORDER — ALPRAZOLAM 0.5 MG PO TABS: 0.5000 mg | ORAL_TABLET | Freq: Two times a day (BID) | ORAL | Status: DC | PRN

## 2015-11-24 NOTE — Patient Instructions (Signed)
Encouraged increased rest and hydration, add probiotics NOW probiotics 10 strain, zinc such as Coldeze or Xicam, Cap. Treat fevers as needed. Plain Mucinex twice daily x 10 days. Nasal saline flushes twice daily. Vit c 500 mg daily, aged or black garlic caps daily, elderberry daily.   Sinusitis, Adult Sinusitis is redness, soreness, and inflammation of the paranasal sinuses. Paranasal sinuses are air pockets within the bones of your face. They are located beneath your eyes, in the middle of your forehead, and above your eyes. In healthy paranasal sinuses, mucus is able to drain out, and air is able to circulate through them by way of your nose. However, when your paranasal sinuses are inflamed, mucus and air can become trapped. This can allow bacteria and other germs to grow and cause infection. Sinusitis can develop quickly and last only a short time (acute) or continue over a long period (chronic). Sinusitis that lasts for more than 12 weeks is considered chronic. CAUSES Causes of sinusitis include:  Allergies.  Structural abnormalities, such as displacement of the cartilage that separates your nostrils (deviated septum), which can decrease the air flow through your nose and sinuses and affect sinus drainage.  Functional abnormalities, such as when the small hairs (cilia) that line your sinuses and help remove mucus do not work properly or are not present. SIGNS AND SYMPTOMS Symptoms of acute and chronic sinusitis are the same. The primary symptoms are pain and pressure around the affected sinuses. Other symptoms include:  Upper toothache.  Earache.  Headache.  Bad breath.  Decreased sense of smell and taste.  A cough, which worsens when you are lying flat.  Fatigue.  Fever.  Thick drainage from your nose, which often is green and may contain pus (purulent).  Swelling and warmth over the affected sinuses. DIAGNOSIS Your health care provider will perform a physical exam. During  your exam, your health care provider may perform any of the following to help determine if you have acute sinusitis or chronic sinusitis:  Look in your nose for signs of abnormal growths in your nostrils (nasal polyps).  Tap over the affected sinus to check for signs of infection.  View the inside of your sinuses using an imaging device that has a light attached (endoscope). If your health care provider suspects that you have chronic sinusitis, one or more of the following tests may be recommended:  Allergy tests.  Nasal culture. A sample of mucus is taken from your nose, sent to a lab, and screened for bacteria.  Nasal cytology. A sample of mucus is taken from your nose and examined by your health care provider to determine if your sinusitis is related to an allergy. TREATMENT Most cases of acute sinusitis are related to a viral infection and will resolve on their own within 10 days. Sometimes, medicines are prescribed to help relieve symptoms of both acute and chronic sinusitis. These may include pain medicines, decongestants, nasal steroid sprays, or saline sprays. However, for sinusitis related to a bacterial infection, your health care provider will prescribe antibiotic medicines. These are medicines that will help kill the bacteria causing the infection. Rarely, sinusitis is caused by a fungal infection. In these cases, your health care provider will prescribe antifungal medicine. For some cases of chronic sinusitis, surgery is needed. Generally, these are cases in which sinusitis recurs more than 3 times per year, despite other treatments. HOME CARE INSTRUCTIONS  Drink plenty of water. Water helps thin the mucus so your sinuses can drain more easily.  Use a humidifier.  Inhale steam 3-4 times a day (for example, sit in the bathroom with the shower running).  Apply a warm, moist washcloth to your face 3-4 times a day, or as directed by your health care provider.  Use saline nasal  sprays to help moisten and clean your sinuses.  Take medicines only as directed by your health care provider.  If you were prescribed either an antibiotic or antifungal medicine, finish it all even if you start to feel better. SEEK IMMEDIATE MEDICAL CARE IF:  You have increasing pain or severe headaches.  You have nausea, vomiting, or drowsiness.  You have swelling around your face.  You have vision problems.  You have a stiff neck.  You have difficulty breathing.   This information is not intended to replace advice given to you by your health care provider. Make sure you discuss any questions you have with your health care provider.   Document Released: 10/01/2005 Document Revised: 10/22/2014 Document Reviewed: 10/16/2011 Elsevier Interactive Patient Education [employer].

## 2015-11-24 NOTE — Progress Notes (Signed)
Patient ID: Curtis Kennedy, male   DOB: Feb 21, 1966, 50 y.o.   MRN: [id number]    Subjective:    Patient ID: Curtis Kennedy, male    DOB: 1966-07-10, 50 y.o.   MRN: [id number]  Chief Complaint  Patient presents with  . Follow-up    HPI Patient is in today for follow-up. He acknowledges he has not started his Levemir. They have had significant family crises and he has been helping to care for his in-laws as well as working full-time and more. He agrees to start the Levemir now. Has been struggling with some head congestion the last few days. Has a cough that is nonproductive. No fevers or chills. Has not tried any over-the-counter medications so far. Denies CP/palp/SOB/HA/fevers/GI or GU c/o. Taking meds as prescribed  Past Medical History  Diagnosis Date  . Erectile dysfunction   . Diabetes mellitus without complication ([place]) 50 yrs old    type 2 (dx'd after long courses of prednisone for his UC)  . Hypertension   . Hyperlipidemia   . 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 Erlene Quan; [facility]--has been in remission since about 2009  . Family history of tinea corporis 07/25/2012  . Insomnia   . Sinusitis 08/28/2015    Past Surgical History  Procedure Laterality Date  . Trigger finger release  2011    b/l middle fingers    Family History  Problem Relation Age of Onset  . Diabetes Father   . Hyperlipidemia Father   . Hypertension Father   . Kidney disease Father     Kidney Failure  . Heart disease Father 65    MI  . Hypertension Sister   . Hypertension Brother   . Diabetes Paternal Grandfather   . Hypertension Brother   . Hyperlipidemia Brother   . Heart disease Brother     Valve Surgery ?  . Diabetes Brother   . Hypertension Brother   . ADD / ADHD Brother     Social History   Social History  . Marital Status: Married    Spouse Name: N/A  . Number of Children: N/A  . Years of Education: N/A   Occupational History  . Not on file.   Social History Main  Topics  . Smoking status: Never Smoker   . Smokeless tobacco: Never Used  . Alcohol Use: No  . Drug Use: No  . 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 Not on file   Social History Narrative   Married, 52 y/o girl.     Lives in [place], grew up in [place]--relocated to [facility] 2007.   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          Outpatient Prescriptions Prior to Visit  Medication Sig Dispense Refill  . busPIRone (BUSPAR) 5 MG tablet Take 5 mg by mouth 2 (two) times daily.    . Dulaglutide (TRULICITY) 1.5 GU/4.4IH SOPN Inject 1.5 mLs into the skin once a week. 12 pen 1  . HYDROcodone-homatropine (HYCODAN) 5-1.5 MG/5ML syrup Take 5 mLs by mouth every 8 (eight) hours as needed for cough. 120 mL 0  . Insulin Detemir (LEVEMIR) 100 UNIT/ML Pen Inject 15 Units into the skin daily at 10 pm. 15 mL 2  . Insulin Pen Needle (DROPLET PEN NEEDLES) 32G X 4  MM MISC Use 1x a day 100 each 11  . insulin regular (NOVOLIN R,HUMULIN R) 100 units/mL injection Inject 0.05-0.1 mLs (5-10 Units total) into the skin 3 (three) times daily before meals. 10 mL 11  . meloxicam (MOBIC) 15 MG tablet Take 15 mg by mouth daily.    . tadalafil (CIALIS) 5 MG tablet Take 5 mg by mouth daily as needed.    . traMADol (ULTRAM) 50 MG tablet Take by mouth as needed.    . valACYclovir (VALTREX) 1000 MG tablet Take 1 tablet (1,000 mg total) by mouth 2 (two) times daily as needed. 30 tablet 1  . zaleplon (SONATA) 10 MG capsule Take 10 mg by mouth at bedtime.    . ALPRAZolam (XANAX) 0.5 MG tablet Take 1 tablet (0.5 mg total) by mouth 2 (two) times daily as needed for anxiety. 60 tablet 0  . azaTHIOprine (IMURAN) 50 MG tablet Take 50 mg by mouth daily. Take 4.5 tabs daily    . balsalazide (COLAZAL) 750 MG capsule Take 2,250 mg by mouth 3 (three) times daily.    . fenofibrate  160 MG tablet Take 160 mg by mouth daily.    Marland Kitchen glimepiride (AMARYL) 4 MG tablet Take 1 tablet (4 mg total) by mouth 2 (two) times daily. 60 tablet 3  . losartan (COZAAR) 50 MG tablet Take 50 mg by mouth daily.    . metFORMIN (GLUCOPHAGE) 500 MG tablet Take 1,000 mg by mouth 2 (two) times daily with a meal.    . metoprolol succinate (TOPROL-XL) 50 MG 24 hr tablet Take 50 mg by mouth daily. Take with or immediately following a meal.    . Pitavastatin Calcium (LIVALO) 4 MG TABS Take by mouth daily.     No facility-administered medications prior to visit.    Allergies  Allergen Reactions  . Simvastatin     Myalgias    Review of Systems  Constitutional: Positive for malaise/fatigue. Negative for fever.  HENT: Positive for congestion.   Eyes: Negative for discharge.  Respiratory: Negative for shortness of breath.   Cardiovascular: Negative for chest pain, palpitations and leg swelling.  Gastrointestinal: Negative for nausea and abdominal pain.  Genitourinary: Negative for dysuria.  Musculoskeletal: Negative for falls.  Skin: Negative for rash.  Neurological: Negative for loss of consciousness and headaches.  Endo/Heme/Allergies: Negative for environmental allergies.  Psychiatric/Behavioral: Negative for depression. The patient is not nervous/anxious.        Objective:    Physical Exam  Constitutional: He is oriented to person, place, and time. He appears well-developed and well-nourished. No distress.  HENT:  Head: Normocephalic and atraumatic.  Eyes: Conjunctivae are normal.  Neck: Neck supple. No thyromegaly present.  Cardiovascular: Normal rate, regular rhythm and normal heart sounds.   No murmur heard. Pulmonary/Chest: Effort normal and breath sounds normal. No respiratory distress. He has no wheezes.  Abdominal: Soft. Bowel sounds are normal. He exhibits no mass. There is no tenderness.  Musculoskeletal: He exhibits no edema.  Lymphadenopathy:    He has no cervical  adenopathy.  Neurological: He is alert and oriented to person, place, and time.  Skin: Skin is warm and dry.  Psychiatric: He has a normal mood and affect. His behavior is normal.    BP 120/82 mmHg  Pulse 85  Temp(Src) 97.9 F (36.6 C) (Oral)  Ht 5' 6"  (1.676 m)  Wt 181 lb 4 oz (82.214 kg)  BMI 29.27 kg/m2  SpO2 97% Wt Readings from Last 3 Encounters:  11/24/15 181  lb 4 oz (82.214 kg)  08/31/15 179 lb 9.6 oz (81.466 kg)  08/22/15 179 lb 8 oz (81.421 kg)     Lab Results  Component Value Date   WBC 4.3 11/24/2015   HGB 14.3 11/24/2015   HCT 41.6 11/24/2015   PLT 218.0 11/24/2015   GLUCOSE 364* 11/24/2015   CHOL 130 11/24/2015   TRIG 103.0 11/24/2015   HDL 41.10 11/24/2015   LDLCALC 69 11/24/2015   ALT 56* 11/24/2015   AST 47* 11/24/2015   NA 135 11/24/2015   K 4.6 11/24/2015   CL 102 11/24/2015   CREATININE 0.82 11/24/2015   BUN 10 11/24/2015   CO2 27 11/24/2015   TSH 1.53 11/24/2015   PSA 0.87 04/22/2015   HGBA1C 10.4* 11/24/2015   MICROALBUR 7.3* 11/24/2015    Lab Results  Component Value Date   TSH 1.53 11/24/2015   Lab Results  Component Value Date   WBC 4.3 11/24/2015   HGB 14.3 11/24/2015   HCT 41.6 11/24/2015   MCV 96.6 11/24/2015   PLT 218.0 11/24/2015   Lab Results  Component Value Date   NA 135 11/24/2015   K 4.6 11/24/2015   CO2 27 11/24/2015   GLUCOSE 364* 11/24/2015   BUN 10 11/24/2015   CREATININE 0.82 11/24/2015   BILITOT 1.0 11/24/2015   ALKPHOS 60 11/24/2015   AST 47* 11/24/2015   ALT 56* 11/24/2015   PROT 7.4 11/24/2015   ALBUMIN 4.3 11/24/2015   CALCIUM 9.7 11/24/2015   GFR 105.23 11/24/2015   Lab Results  Component Value Date   CHOL 130 11/24/2015   Lab Results  Component Value Date   HDL 41.10 11/24/2015   Lab Results  Component Value Date   LDLCALC 69 11/24/2015   Lab Results  Component Value Date   TRIG 103.0 11/24/2015   Lab Results  Component Value Date   CHOLHDL 3 11/24/2015   Lab Results    Component Value Date   HGBA1C 10.4* 11/24/2015       Assessment & Plan:   Problem List Items Addressed This Visit    Hyperlipidemia    Tolerating statin, encouraged heart healthy diet, avoid trans fats, minimize simple carbs and saturated fats. Increase exercise as tolerated      Relevant Medications   Pitavastatin Calcium (LIVALO) 4 MG TABS   metoprolol succinate (TOPROL-XL) 50 MG 24 hr tablet   metFORMIN (GLUCOPHAGE) 500 MG tablet   losartan (COZAAR) 50 MG tablet   glimepiride (AMARYL) 4 MG tablet   fenofibrate 160 MG tablet   balsalazide (COLAZAL) 750 MG capsule   ALPRAZolam (XANAX) 0.5 MG tablet   cefdinir (OMNICEF) 300 MG capsule   azaTHIOprine (IMURAN) 50 MG tablet   Other Relevant Orders   TSH (Completed)   CBC (Completed)   Comprehensive metabolic panel (Completed)   Hemoglobin A1c (Completed)   Lipid panel (Completed)   Microalbumin / creatinine urine ratio (Completed)   Hepatitis C antibody (Completed)   HIV antibody (with reflex) (Completed)   Hypertension - Primary    Well controlled, no changes to meds. Encouraged heart healthy diet such as the DASH diet and exercise as tolerated.       Relevant Medications   Pitavastatin Calcium (LIVALO) 4 MG TABS   metoprolol succinate (TOPROL-XL) 50 MG 24 hr tablet   metFORMIN (GLUCOPHAGE) 500 MG tablet   losartan (COZAAR) 50 MG tablet   glimepiride (AMARYL) 4 MG tablet   fenofibrate 160 MG tablet   balsalazide (COLAZAL) 750 MG  capsule   ALPRAZolam (XANAX) 0.5 MG tablet   cefdinir (OMNICEF) 300 MG capsule   azaTHIOprine (IMURAN) 50 MG tablet   Other Relevant Orders   TSH (Completed)   CBC (Completed)   Comprehensive metabolic panel (Completed)   Hemoglobin A1c (Completed)   Lipid panel (Completed)   Microalbumin / creatinine urine ratio (Completed)   Hepatitis C antibody (Completed)   HIV antibody (with reflex) (Completed)   Sinusitis    Encouraged increased rest and hydration, add probiotics, zinc such as  Coldeze or Xicam. Treat fevers as needed. Add vitamin C and elderberry, as well as mucinex call if symptoms worsen for further treatment      Relevant Medications   cefdinir (OMNICEF) 300 MG capsule   Uncontrolled type 2 diabetes mellitus without complication, without long-term current use of insulin (HCC)    Agrees to return to endocrinology for further treatment. Minimize simple carbohydrates. He has had numerous family crises so he has not started his Levemir but agrees to do so now.       Relevant Medications   Pitavastatin Calcium (LIVALO) 4 MG TABS   metoprolol succinate (TOPROL-XL) 50 MG 24 hr tablet   metFORMIN (GLUCOPHAGE) 500 MG tablet   losartan (COZAAR) 50 MG tablet   glimepiride (AMARYL) 4 MG tablet   fenofibrate 160 MG tablet   balsalazide (COLAZAL) 750 MG capsule   ALPRAZolam (XANAX) 0.5 MG tablet   cefdinir (OMNICEF) 300 MG capsule   azaTHIOprine (IMURAN) 50 MG tablet   Other Relevant Orders   TSH (Completed)   CBC (Completed)   Comprehensive metabolic panel (Completed)   Hemoglobin A1c (Completed)   Lipid panel (Completed)   Microalbumin / creatinine urine ratio (Completed)   Hepatitis C antibody (Completed)   HIV antibody (with reflex) (Completed)      I have changed Mr. Yarbro's Pitavastatin Calcium, metoprolol succinate, metFORMIN, losartan, fenofibrate, and balsalazide. I am also having him start on cefdinir. Additionally, I am having him maintain his meloxicam, tadalafil, zaleplon, busPIRone, traMADol, insulin regular, HYDROcodone-homatropine, valACYclovir, Insulin Detemir, Insulin Pen Needle, Dulaglutide, glimepiride, ALPRAZolam, and azaTHIOprine.  Meds ordered this encounter  Medications  . Pitavastatin Calcium (LIVALO) 4 MG TABS    Sig: Take 1 tablet (4 mg total) by mouth daily.    Dispense:  30 tablet    Refill:  6  . metoprolol succinate (TOPROL-XL) 50 MG 24 hr tablet    Sig: Take 1 tablet (50 mg total) by mouth daily. Take with or immediately  following a meal.    Dispense:  30 tablet    Refill:  6  . metFORMIN (GLUCOPHAGE) 500 MG tablet    Sig: Take 2 tablets (1,000 mg total) by mouth 2 (two) times daily with a meal.    Dispense:  60 tablet    Refill:  6  . losartan (COZAAR) 50 MG tablet    Sig: Take 1 tablet (50 mg total) by mouth daily.    Dispense:  30 tablet    Refill:  6  . glimepiride (AMARYL) 4 MG tablet    Sig: Take 1 tablet (4 mg total) by mouth 2 (two) times daily.    Dispense:  60 tablet    Refill:  6  . fenofibrate 160 MG tablet    Sig: Take 1 tablet (160 mg total) by mouth daily.    Dispense:  30 tablet    Refill:  6  . balsalazide (COLAZAL) 750 MG capsule    Sig: Take 3 capsules (2,250 mg  total) by mouth 3 (three) times daily.    Dispense:  90 capsule    Refill:  5  . DISCONTD: azaTHIOprine (IMURAN) 50 MG tablet    Sig: Take 1 tablet (50 mg total) by mouth daily. Take 4.5 tabs daily    Dispense:  30 tablet    Refill:  5  . ALPRAZolam (XANAX) 0.5 MG tablet    Sig: Take 1 tablet (0.5 mg total) by mouth 2 (two) times daily as needed for anxiety.    Dispense:  60 tablet    Refill:  0  . cefdinir (OMNICEF) 300 MG capsule    Sig: Take 1 capsule (300 mg total) by mouth 2 (two) times daily.    Dispense:  28 capsule    Refill:  0  . azaTHIOprine (IMURAN) 50 MG tablet    Sig: Take 1 tablet (50 mg total) by mouth daily. Take 4.5 tabs daily    Dispense:  135 tablet    Refill:  5     Penni Homans, MD

## 2015-11-24 NOTE — Progress Notes (Signed)
Pre visit review using our clinic review tool, if applicable. No additional management support is needed unless otherwise documented below in the visit note. 

## 2015-11-28 ENCOUNTER — Encounter: Payer: Self-pay | Admitting: Family Medicine

## 2015-11-28 NOTE — Assessment & Plan Note (Signed)
Well controlled, no changes to meds. Encouraged heart healthy diet such as the DASH diet and exercise as tolerated.  °

## 2015-11-28 NOTE — Assessment & Plan Note (Signed)
Tolerating statin, encouraged heart healthy diet, avoid trans fats, minimize simple carbs and saturated fats. Increase exercise as tolerated 

## 2015-11-28 NOTE — Assessment & Plan Note (Addendum)
Agrees to return to endocrinology for further treatment. Minimize simple carbohydrates. He has had numerous family crises so he has not started his Levemir but agrees to do so now.

## 2015-11-28 NOTE — Assessment & Plan Note (Signed)
Encouraged increased rest and hydration, add probiotics, zinc such as Coldeze or Xicam. Treat fevers as needed. Add vitamin C and elderberry, as well as mucinex call if symptoms worsen for further treatment

## 2016-01-09 ENCOUNTER — Ambulatory Visit: Payer: Managed Care, Other (non HMO) | Admitting: Internal Medicine

## 2016-01-26 ENCOUNTER — Ambulatory Visit (INDEPENDENT_AMBULATORY_CARE_PROVIDER_SITE_OTHER): Payer: Managed Care, Other (non HMO) | Admitting: Medical

## 2016-01-26 ENCOUNTER — Encounter: Payer: Self-pay | Admitting: Medical

## 2016-01-26 VITALS — BP 110/72 | HR 74 | Temp 98.0°F | Ht 66.0 in | Wt 177.4 lb

## 2016-01-26 DIAGNOSIS — R05 Cough: Secondary | ICD-10-CM | POA: Diagnosis not present

## 2016-01-26 DIAGNOSIS — R059 Cough, unspecified: Secondary | ICD-10-CM

## 2016-01-26 DIAGNOSIS — J0100 Acute maxillary sinusitis, unspecified: Secondary | ICD-10-CM | POA: Diagnosis not present

## 2016-01-26 DIAGNOSIS — J209 Acute bronchitis, unspecified: Secondary | ICD-10-CM | POA: Diagnosis not present

## 2016-01-26 DIAGNOSIS — R062 Wheezing: Secondary | ICD-10-CM | POA: Diagnosis not present

## 2016-01-26 MED ORDER — ALBUTEROL SULFATE HFA 108 (90 BASE) MCG/ACT IN AERS: 2.0000 | INHALATION_SPRAY | Freq: Four times a day (QID) | RESPIRATORY_TRACT | Status: DC | PRN

## 2016-01-26 MED ORDER — FLUTICASONE PROPIONATE 50 MCG/ACT NA SUSP: 2.0000 | Freq: Every day | NASAL | Status: DC

## 2016-01-26 MED ORDER — HYDROCODONE-HOMATROPINE 5-1.5 MG/5ML PO SYRP: 5.0000 mL | ORAL_SOLUTION | Freq: Three times a day (TID) | ORAL | Status: DC | PRN

## 2016-01-26 MED ORDER — AZITHROMYCIN 250 MG PO TABS: ORAL_TABLET | ORAL | Status: DC

## 2016-01-26 NOTE — Progress Notes (Signed)
Subjective:    Patient ID: Curtis Kennedy, male    DOB: 1966-02-29, 50 y.o.   MRN: [id number]  HPI   Pt in states sick for about 10 days. Pt states taking robitussin for illness. Wife has bronchitis. Daughter has sinus infection.  Pt has nasal congestion, cough at night and in am. Some faint wheezing in the evening. Pt is coughing up some mucous. Pt was sneezing early on.   No fever, no chills or sweats. Then states maybe first day. No bodyaches reported.  Pt is on allegra.   Review of Systems  Constitutional: Negative for fever, chills and fatigue.  HENT: Positive for congestion, sinus pressure and sneezing. Negative for drooling, facial swelling and postnasal drip.   Respiratory: Positive for cough. Negative for chest tightness, shortness of breath and wheezing.   Cardiovascular: Negative for chest pain and palpitations.  Gastrointestinal: Negative for abdominal pain.  Musculoskeletal: Negative for back pain, joint swelling and neck pain.  Neurological: Negative for dizziness and light-headedness.  Hematological: Negative for adenopathy. Does not bruise/bleed easily.  Psychiatric/Behavioral: Negative for behavioral problems, confusion and dysphoric mood. The patient is not nervous/anxious.     Past Medical History  Diagnosis Date  . Erectile dysfunction   . Diabetes mellitus without complication ([place]) 50 yrs old    type 2 (dx'd after long courses of prednisone for his UC)  . Hypertension   . Hyperlipidemia   . Colitis, ulcerative ([place]) Dx'd 2001    Dr. Erlene Quan; [facility]--has been in remission since about 2009  . Family history of tinea corporis 07/25/2012  . Insomnia   . Sinusitis 08/28/2015    Social History   Social History  . Marital Status: Married    Spouse Name: N/A  . Number of Children: N/A  . Years of Education: N/A   Occupational History  . Not on file.   Social History Main Topics  . Smoking status: Never Smoker   . Smokeless tobacco: Never Used    . Alcohol Use: No  . Drug Use: No  . 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 Not on file   Social History Narrative   Married, 52 y/o girl.     Lives in [place], grew up in [place]--relocated to [facility] 2007.   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          Past Surgical History  Procedure Laterality Date  . Trigger finger release  2011    b/l middle fingers    Family History  Problem Relation Age of Onset  . Diabetes Father   . Hyperlipidemia Father   . Hypertension Father   . Kidney disease Father     Kidney Failure  . Heart disease Father 47    MI  . Hypertension Sister   . Hypertension Brother   . Diabetes Paternal Grandfather   . Hypertension Brother   . Hyperlipidemia Brother   . Heart disease Brother     Valve Surgery ?  . Diabetes Brother   . Hypertension Brother   . ADD / ADHD Brother     Allergies  Allergen Reactions  . Simvastatin     Myalgias    Current Outpatient Prescriptions on File Prior to Visit  Medication Sig Dispense Refill  . ALPRAZolam (XANAX) 0.5 MG tablet  Take 1 tablet (0.5 mg total) by mouth 2 (two) times daily as needed for anxiety. 60 tablet 0  . azaTHIOprine (IMURAN) 50 MG tablet Take 1 tablet (50 mg total) by mouth daily. Take 4.5 tabs daily 135 tablet 5  . balsalazide (COLAZAL) 750 MG capsule Take 3 capsules (2,250 mg total) by mouth 3 (three) times daily. 90 capsule 5  . busPIRone (BUSPAR) 5 MG tablet Take 5 mg by mouth 2 (two) times daily.    . cefdinir (OMNICEF) 300 MG capsule Take 1 capsule (300 mg total) by mouth 2 (two) times daily. 28 capsule 0  . Dulaglutide (TRULICITY) 1.5 DX/8.3JA SOPN Inject 1.5 mLs into the skin once a week. 12 pen 1  . fenofibrate 160 MG tablet Take 1 tablet (160 mg total) by mouth daily. 30 tablet 6  . glimepiride (AMARYL) 4 MG tablet Take 1  tablet (4 mg total) by mouth 2 (two) times daily. 60 tablet 6  . HYDROcodone-homatropine (HYCODAN) 5-1.5 MG/5ML syrup Take 5 mLs by mouth every 8 (eight) hours as needed for cough. 120 mL 0  . Insulin Detemir (LEVEMIR) 100 UNIT/ML Pen Inject 15 Units into the skin daily at 10 pm. 15 mL 2  . Insulin Pen Needle (DROPLET PEN NEEDLES) 32G X 4 MM MISC Use 1x a day 100 each 11  . insulin regular (NOVOLIN R,HUMULIN R) 100 units/mL injection Inject 0.05-0.1 mLs (5-10 Units total) into the skin 3 (three) times daily before meals. 10 mL 11  . losartan (COZAAR) 50 MG tablet Take 1 tablet (50 mg total) by mouth daily. 30 tablet 6  . meloxicam (MOBIC) 15 MG tablet Take 15 mg by mouth daily.    . metFORMIN (GLUCOPHAGE) 500 MG tablet Take 2 tablets (1,000 mg total) by mouth 2 (two) times daily with a meal. 60 tablet 6  . metoprolol succinate (TOPROL-XL) 50 MG 24 hr tablet Take 1 tablet (50 mg total) by mouth daily. Take with or immediately following a meal. 30 tablet 6  . Pitavastatin Calcium (LIVALO) 4 MG TABS Take 1 tablet (4 mg total) by mouth daily. 30 tablet 6  . tadalafil (CIALIS) 5 MG tablet Take 5 mg by mouth daily as needed.    . traMADol (ULTRAM) 50 MG tablet Take by mouth as needed.    . valACYclovir (VALTREX) 1000 MG tablet Take 1 tablet (1,000 mg total) by mouth 2 (two) times daily as needed. 30 tablet 1  . zaleplon (SONATA) 10 MG capsule Take 10 mg by mouth at bedtime.     No current facility-administered medications on file prior to visit.    BP 110/72 mmHg  Pulse 74  Temp(Src) 98 F (36.7 C) (Oral)  Ht 5' 6"  (1.676 m)  Wt 177 lb 6.4 oz (80.468 kg)  BMI 28.65 kg/m2  SpO2 98%       Objective:   Physical Exam  General  Mental Status - Alert. General Appearance - Well groomed. Not in acute distress.  Skin Rashes- No Rashes.  HEENT Head- Normal. Ear Auditory Canal - Left- Normal. Right - Normal.Tympanic Membrane- Left- Normal. Right- Normal. Eye Sclera/Conjunctiva- Left-  Normal. Right- Normal. Nose & Sinuses Nasal Mucosa- Left-  Boggy and Congested. Right-  Boggy and  Congested.Bilateral maxillary and frontal sinus pressure. Mouth & Throat Lips: Upper Lip- Normal: no dryness, cracking, pallor, cyanosis, or vesicular eruption. Lower Lip-Normal: no dryness, cracking, pallor, cyanosis or vesicular eruption. Buccal Mucosa- Bilateral- No Aphthous ulcers. Oropharynx- No Discharge or Erythema. +pnd Tonsils: Characteristics-  Bilateral- No Erythema or Congestion. Size/Enlargement- Bilateral- No enlargement. Discharge- bilateral-None.  Neck Neck- Supple. No Masses.   Chest and Lung Exam Auscultation: Breath Sounds:-even and unlabored. Faint rhonchi and expiratory wheezing mild.  Cardiovascular Auscultation:Rythm- Regular, rate and rhythm. Murmurs & Other Heart Sounds:Ausculatation of the heart reveal- No Murmurs.  Lymphatic Head & Neck General Head & Neck Lymphatics: Bilateral: Description- No Localized lymphadenopathy.       Assessment & Plan:   You appear to have bronchitis and sinusitis. Rest hydrate and tylenol for fever. I am prescribing cough medicine hycodan, and azithromycin antibiotic. For your nasal congestion you flonase.  For wheezing rx albuterol  Some allergy flair as well. So continue allegra  You should gradually get better. If not then notify us and would recommend a chest xray.  Follow up in 7-10 days or as needed

## 2016-01-26 NOTE — Patient Instructions (Addendum)
You appear to have bronchitis and sinusitis. Rest hydrate and tylenol for fever. I am prescribing cough medicine hycodan, and azithromycin antibiotic. For your nasal congestion you flonase.  Some allergy flair as well. So continue allegra  For wheezing rx albuterol  You should gradually get better. If not then notify us and would recommend a chest xray.  Follow up in 7-10 days or as needed

## 2016-01-26 NOTE — Progress Notes (Signed)
Pre visit review using our clinic review tool, if applicable. No additional management support is needed unless otherwise documented below in the visit note. 

## 2016-02-20 LAB — HM DIABETES EYE EXAM

## 2016-02-21 ENCOUNTER — Ambulatory Visit: Payer: Managed Care, Other (non HMO) | Admitting: Family Medicine

## 2016-02-23 ENCOUNTER — Encounter: Payer: Self-pay | Admitting: Family Medicine

## 2016-02-28 ENCOUNTER — Ambulatory Visit: Payer: Managed Care, Other (non HMO) | Admitting: Family Medicine

## 2016-04-09 ENCOUNTER — Ambulatory Visit: Payer: Self-pay | Admitting: Family Medicine

## 2016-04-11 ENCOUNTER — Encounter: Payer: Self-pay | Admitting: Family Medicine

## 2016-05-07 ENCOUNTER — Other Ambulatory Visit: Payer: Self-pay | Admitting: Family Medicine

## 2016-05-07 DIAGNOSIS — E785 Hyperlipidemia, unspecified: Secondary | ICD-10-CM

## 2016-05-07 DIAGNOSIS — E1165 Type 2 diabetes mellitus with hyperglycemia: Secondary | ICD-10-CM

## 2016-05-07 DIAGNOSIS — I10 Essential (primary) hypertension: Secondary | ICD-10-CM

## 2016-05-07 DIAGNOSIS — IMO0001 Reserved for inherently not codable concepts without codable children: Secondary | ICD-10-CM

## 2016-05-07 MED ORDER — METFORMIN HCL 500 MG PO TABS: 1000.0000 mg | ORAL_TABLET | Freq: Two times a day (BID) | ORAL | 1 refills | Status: DC

## 2016-05-08 ENCOUNTER — Telehealth: Payer: Self-pay

## 2016-05-08 NOTE — Telephone Encounter (Signed)
PA initiated for Livalo 4 mg tabs. KEY: [id number]. Awaiting determination.

## 2016-05-11 ENCOUNTER — Telehealth: Payer: Self-pay

## 2016-05-11 NOTE — Telephone Encounter (Signed)
[place] Form filled out and faxed back.

## 2016-05-11 NOTE — Telephone Encounter (Signed)
Your request has been approved  Effective from 05/08/2016 through 10/14/2038.

## 2016-05-18 ENCOUNTER — Other Ambulatory Visit: Payer: Self-pay

## 2016-05-18 MED ORDER — EXENATIDE ER 2 MG ~~LOC~~ PEN: PEN_INJECTOR | SUBCUTANEOUS | 3 refills | Status: DC

## 2016-05-18 NOTE — Telephone Encounter (Signed)
Left message for patient to notify him of medication change.

## 2016-05-24 ENCOUNTER — Telehealth: Payer: Self-pay | Admitting: Internal Medicine

## 2016-05-24 NOTE — Telephone Encounter (Signed)
There is no exact substitute. We could switch him to an SSRI such as lexapro to take daily but will take awhile to start working. Disp #30 with 2 rf and needs an appt for follow up in 8-12 weeks. If he is feeling well he coulod also try just dropping to daily for a week and then stopping and see how he does

## 2016-05-24 NOTE — Telephone Encounter (Signed)
Will route to Dr. Charlett Blake.

## 2016-05-24 NOTE — Telephone Encounter (Signed)
To Clarify this request. Trulicity is not covered by his insurance (not buspar), but they will cover Bydureon.  He is almost out of insulin and need the Bydureon sent in asap.  Advise please,

## 2016-05-24 NOTE — Telephone Encounter (Signed)
Patient stated that his insurance will not cover busPIRone (BUSPAR) 5 MG tablet, please advise

## 2016-05-24 NOTE — Telephone Encounter (Signed)
Wife Eulice Art) ask for a call back prior to refilling medication. States that patient is taking Trulicity and not Buspar.  She does not know who she spoke with last. Please call wife @ [phone]

## 2016-05-24 NOTE — Telephone Encounter (Signed)
Can try Bydureon 2 mg sq weekly, disp 30 day supply with 3 rf.

## 2016-05-24 NOTE — Telephone Encounter (Signed)
Patient wife informed of change and ok to try. Need to know what strength lexapro to send in ([facility] [place])

## 2016-05-25 ENCOUNTER — Telehealth: Payer: Self-pay | Admitting: Family Medicine

## 2016-05-25 ENCOUNTER — Other Ambulatory Visit: Payer: Self-pay

## 2016-05-25 MED ORDER — EXENATIDE ER 2 MG ~~LOC~~ PEN: PEN_INJECTOR | SUBCUTANEOUS | 3 refills | Status: DC

## 2016-05-25 MED ORDER — ALBIGLUTIDE 30 MG ~~LOC~~ PEN: PEN_INJECTOR | SUBCUTANEOUS | 3 refills | Status: DC

## 2016-05-25 NOTE — Telephone Encounter (Signed)
Pt's spouse Raif Art called in to speak with CMA.   CB: [phone]

## 2016-05-25 NOTE — Telephone Encounter (Signed)
Patient's wife wanted you to know there is no rx at the pharmacy for the Trulicity  She did not know if you needed the rx to be at the pharmacy in order to get the authorization

## 2016-05-25 NOTE — Telephone Encounter (Signed)
Patient needs a PA on Trulicity.  He is coming to the office today to update his insurance card in his chart, because as of 04/14/2016 it changed to [facility].  [place] office if to notify us once its updated so PA can be generated for Trulicity. Thanks,

## 2016-05-25 NOTE — Telephone Encounter (Signed)
PA initiated on [email or web address].   [id number] Awaiting response.//AB/CMA

## 2016-05-25 NOTE — Telephone Encounter (Signed)
Refill done and patient informed

## 2016-05-30 NOTE — Telephone Encounter (Signed)
Pt called back in to follow up. Per CMA she will call and check status and call pt back.    CB: [phone]

## 2016-05-31 NOTE — Telephone Encounter (Signed)
Received denial of trulicity.  Called the patient left a message to call back.

## 2016-06-01 MED ORDER — LIRAGLUTIDE 18 MG/3ML ~~LOC~~ SOPN: PEN_INJECTOR | SUBCUTANEOUS | 2 refills | Status: DC

## 2016-06-01 NOTE — Addendum Note (Signed)
Addended by: Sharon Seller B on: 06/01/2016 01:27 PM   Modules accepted: Orders

## 2016-06-01 NOTE — Telephone Encounter (Signed)
OK to d/c Trulicity and start Victoza. Have to titrate it up weekly. Start at 0.6 mg [place] daily x 7 days then 1.2 mg [place] daily then 1.8 mg [place] daily disp 30 day supply with 2 rf or 90 day supply with no refills per patient preference and make sure he is in for labs and visit before that runs out. Also make sure it has been a full seven days since his last Trulicity dose before he takes a Victoza dose

## 2016-06-01 NOTE — Telephone Encounter (Signed)
Sent in [place] as instructed. Patient informed. Patients next appt. Is 06/28/2016, if you want him to do labs before let me know what to order and I will coordinate with him

## 2016-06-01 NOTE — Telephone Encounter (Signed)
I spoke to the patient this morning to inform of denial. The letter of denial states Victoza if the only alternative medication that must be tried.  He is ok with trying Victoza.  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  Ok to send in [place] for him to try to [facility] in [place] as soon as possible, sugars are beginning to spike as he has been without anything for a week. Cell number to call to the patient please today [phone]

## 2016-06-03 NOTE — Telephone Encounter (Signed)
Hgba1c, lipid, cmp, cbc, tsh. For Diabetes 2, htn, hyperlipidemia

## 2016-06-04 ENCOUNTER — Other Ambulatory Visit: Payer: Self-pay | Admitting: Family Medicine

## 2016-06-04 DIAGNOSIS — E119 Type 2 diabetes mellitus without complications: Secondary | ICD-10-CM

## 2016-06-04 DIAGNOSIS — E785 Hyperlipidemia, unspecified: Secondary | ICD-10-CM

## 2016-06-04 DIAGNOSIS — I10 Essential (primary) hypertension: Secondary | ICD-10-CM

## 2016-06-04 NOTE — Telephone Encounter (Signed)
appt done

## 2016-06-04 NOTE — Telephone Encounter (Signed)
Placed lab orders. Called cell/home number to have pt. Call to schedule lab appt.

## 2016-06-21 ENCOUNTER — Other Ambulatory Visit (INDEPENDENT_AMBULATORY_CARE_PROVIDER_SITE_OTHER): Payer: BLUE CROSS/BLUE SHIELD

## 2016-06-21 DIAGNOSIS — E785 Hyperlipidemia, unspecified: Secondary | ICD-10-CM | POA: Diagnosis not present

## 2016-06-21 DIAGNOSIS — E119 Type 2 diabetes mellitus without complications: Secondary | ICD-10-CM

## 2016-06-21 DIAGNOSIS — I10 Essential (primary) hypertension: Secondary | ICD-10-CM | POA: Diagnosis not present

## 2016-06-21 LAB — COMPREHENSIVE METABOLIC PANEL
ALBUMIN: 4.4 g/dL (ref 3.5–5.2)
ALK PHOS: 55 U/L (ref 39–117)
ALT: 24 U/L (ref 0–53)
AST: 21 U/L (ref 0–37)
BUN: 15 mg/dL (ref 6–23)
CALCIUM: 9.2 mg/dL (ref 8.4–10.5)
CO2: 27 mEq/L (ref 19–32)
Chloride: 100 mEq/L (ref 96–112)
Creatinine, Ser: 0.9 mg/dL (ref 0.40–1.50)
GFR: 94.3 mL/min (ref >60.00)
Glucose, Bld: 427 mg/dL — ABNORMAL HIGH (ref 70–99)
POTASSIUM: 4.1 meq/L (ref 3.5–5.1)
Sodium: 134 mEq/L — ABNORMAL LOW (ref 135–145)
TOTAL PROTEIN: 7.4 g/dL (ref 6.0–8.3)
Total Bilirubin: 1 mg/dL (ref 0.2–1.2)

## 2016-06-21 LAB — LIPID PANEL
CHOLESTEROL: 157 mg/dL (ref 0–200)
HDL: 41.9 mg/dL (ref >39.00)
LDL Cholesterol: 84 mg/dL (ref 0–99)
NONHDL: 115.59
TRIGLYCERIDES: 159 mg/dL — AB (ref 0.0–149.0)
Total CHOL/HDL Ratio: 4
VLDL: 31.8 mg/dL (ref 0.0–40.0)

## 2016-06-21 LAB — CBC WITH DIFFERENTIAL/PLATELET
Basophils Absolute: 0 10*3/uL (ref 0.0–0.1)
Basophils Relative: 0.4 % (ref 0.0–3.0)
EOS PCT: 1.7 % (ref 0.0–5.0)
Eosinophils Absolute: 0.1 10*3/uL (ref 0.0–0.7)
HCT: 41.9 % (ref 39.0–52.0)
HEMOGLOBIN: 14.6 g/dL (ref 13.0–17.0)
LYMPHS PCT: 21.9 % (ref 12.0–46.0)
Lymphs Abs: 1.1 10*3/uL (ref 0.7–4.0)
MCHC: 34.9 g/dL (ref 30.0–36.0)
MCV: 92.9 fl (ref 78.0–100.0)
MONOS PCT: 6.1 % (ref 3.0–12.0)
Monocytes Absolute: 0.3 10*3/uL (ref 0.1–1.0)
Neutro Abs: 3.4 10*3/uL (ref 1.4–7.7)
Neutrophils Relative %: 69.9 % (ref 43.0–77.0)
Platelets: 225 10*3/uL (ref 150.0–400.0)
RBC: 4.51 Mil/uL (ref 4.22–5.81)
RDW: 14.2 % (ref 11.5–15.5)
WBC: 4.8 10*3/uL (ref 4.0–10.5)

## 2016-06-21 LAB — TSH: TSH: 2.59 u[IU]/mL (ref 0.35–4.50)

## 2016-06-21 LAB — HEMOGLOBIN A1C: HEMOGLOBIN A1C: 10.7 % — AB (ref 4.6–6.5)

## 2016-06-25 ENCOUNTER — Encounter: Payer: Self-pay | Admitting: Family Medicine

## 2016-06-25 ENCOUNTER — Other Ambulatory Visit: Payer: Self-pay | Admitting: Family Medicine

## 2016-06-25 ENCOUNTER — Telehealth: Payer: Self-pay

## 2016-06-25 DIAGNOSIS — E1165 Type 2 diabetes mellitus with hyperglycemia: Secondary | ICD-10-CM

## 2016-06-25 DIAGNOSIS — IMO0001 Reserved for inherently not codable concepts without codable children: Secondary | ICD-10-CM

## 2016-06-25 DIAGNOSIS — I10 Essential (primary) hypertension: Secondary | ICD-10-CM

## 2016-06-25 DIAGNOSIS — E785 Hyperlipidemia, unspecified: Secondary | ICD-10-CM

## 2016-06-25 MED ORDER — AZATHIOPRINE 50 MG PO TABS: ORAL_TABLET | ORAL | 5 refills | Status: DC

## 2016-06-25 NOTE — Telephone Encounter (Signed)
Called and left message, advising patient that medication [place] was denied, advised patient we could try Victozia, and advised it was an everyday injection and not weekly. Gave call back number when he wanted to discuss changing.

## 2016-06-28 ENCOUNTER — Encounter: Payer: Self-pay | Admitting: Family Medicine

## 2016-06-28 ENCOUNTER — Ambulatory Visit (INDEPENDENT_AMBULATORY_CARE_PROVIDER_SITE_OTHER): Payer: BLUE CROSS/BLUE SHIELD | Admitting: Family Medicine

## 2016-06-28 DIAGNOSIS — G4700 Insomnia, unspecified: Secondary | ICD-10-CM

## 2016-06-28 DIAGNOSIS — I10 Essential (primary) hypertension: Secondary | ICD-10-CM | POA: Diagnosis not present

## 2016-06-28 DIAGNOSIS — IMO0001 Reserved for inherently not codable concepts without codable children: Secondary | ICD-10-CM

## 2016-06-28 DIAGNOSIS — E785 Hyperlipidemia, unspecified: Secondary | ICD-10-CM | POA: Diagnosis not present

## 2016-06-28 DIAGNOSIS — E1165 Type 2 diabetes mellitus with hyperglycemia: Secondary | ICD-10-CM

## 2016-06-28 MED ORDER — TRAMADOL HCL 50 MG PO TABS: 50.0000 mg | ORAL_TABLET | Freq: Three times a day (TID) | ORAL | 0 refills | Status: DC | PRN

## 2016-06-28 MED ORDER — DULAGLUTIDE 1.5 MG/0.5ML ~~LOC~~ SOAJ: 1.5000 mg | Freq: Every day | SUBCUTANEOUS | 5 refills | Status: DC

## 2016-06-28 MED ORDER — ALPRAZOLAM 0.5 MG PO TABS: 0.5000 mg | ORAL_TABLET | Freq: Two times a day (BID) | ORAL | 1 refills | Status: DC | PRN

## 2016-06-28 MED ORDER — INSULIN DETEMIR 100 UNIT/ML FLEXPEN: 15.0000 [IU] | PEN_INJECTOR | Freq: Every day | SUBCUTANEOUS | 2 refills | Status: DC

## 2016-06-28 MED FILL — LEVEMIR FLEXTOUCH 100 UNITS: 100 | 80 days supply | Qty: 12 | Fill #0

## 2016-06-28 NOTE — Patient Instructions (Signed)

## 2016-06-28 NOTE — Progress Notes (Signed)
Pre visit review using our clinic review tool, if applicable. No additional management support is needed unless otherwise documented below in the visit note. 

## 2016-07-03 ENCOUNTER — Telehealth: Payer: Self-pay

## 2016-07-03 ENCOUNTER — Ambulatory Visit (INDEPENDENT_AMBULATORY_CARE_PROVIDER_SITE_OTHER): Payer: BLUE CROSS/BLUE SHIELD | Admitting: Medical

## 2016-07-03 ENCOUNTER — Other Ambulatory Visit: Payer: Self-pay

## 2016-07-03 ENCOUNTER — Encounter: Payer: Self-pay | Admitting: Medical

## 2016-07-03 VITALS — BP 132/80 | HR 79 | Temp 98.0°F | Ht 66.0 in | Wt 179.4 lb

## 2016-07-03 DIAGNOSIS — N529 Male erectile dysfunction, unspecified: Secondary | ICD-10-CM

## 2016-07-03 DIAGNOSIS — M545 Low back pain, unspecified: Secondary | ICD-10-CM

## 2016-07-03 DIAGNOSIS — E1169 Type 2 diabetes mellitus with other specified complication: Secondary | ICD-10-CM

## 2016-07-03 LAB — POCT URINALYSIS DIPSTICK
BILIRUBIN UA: NEGATIVE
Ketones, UA: NEGATIVE
Leukocytes, UA: NEGATIVE
NITRITE UA: NEGATIVE
Protein, UA: NEGATIVE
RBC UA: NEGATIVE
Spec Grav, UA: 1.015
Urobilinogen, UA: 4
pH, UA: 6

## 2016-07-03 MED ORDER — HYDROCODONE-ACETAMINOPHEN 5-325 MG PO TABS: 1.0000 | ORAL_TABLET | Freq: Four times a day (QID) | ORAL | 0 refills | Status: DC | PRN

## 2016-07-03 MED ORDER — DULAGLUTIDE 1.5 MG/0.5ML ~~LOC~~ SOAJ: 1.5000 mg | Freq: Every day | SUBCUTANEOUS | 5 refills | Status: DC

## 2016-07-03 MED ORDER — SILDENAFIL CITRATE 20 MG PO TABS: ORAL_TABLET | ORAL | 0 refills | Status: DC

## 2016-07-03 MED FILL — HYDROCODON-APAP 5-325: 5-325 | 5 days supply | Qty: 20 | Fill #0

## 2016-07-03 MED FILL — TRULICITY 1.5 MG/0.5 ML PEN: 1.5 | 28 days supply | Qty: 2 | Fill #0

## 2016-07-03 NOTE — Progress Notes (Addendum)
Subjective:    Patient ID: Curtis Kennedy, male    DOB: 11/28/1965, 50 y.o.   MRN: [id number]  HPI  Pt in for actual ED. Not prostate issues. He had only mentioned prostate issues to front staff since they were male. This has been going on for at least 3-4 years.  Pt also has some low back pain. Pt states he hurt back lifting something  initially. Then hurt again messing with his new bed. Now in total pain for 3-4wks. Chiropracter treated him various times. Pt had print prescription of tramadol. He did not fill that rx has paper copy at home. Pt had old rx of tramadol which was left over tabs(years ago). He only took 1 tablet. He states real dizzy. No know reaction to hydrocodone. He thinks has used cough meds with hydrocodone.  No radicular pain, no foot drop and no saddle anesthesia.    Review of Systems  Constitutional: Negative for chills, fatigue and fever.  Respiratory: Negative for cough, shortness of breath and wheezing.   Gastrointestinal: Negative for abdominal pain, constipation, diarrhea and nausea.  Genitourinary: Negative for difficulty urinating, flank pain, frequency and testicular pain.       Erectile dysfunction.  Musculoskeletal: Positive for back pain.  Skin: Negative for rash.  Neurological: Negative for dizziness, seizures, syncope, weakness and light-headedness.  Hematological: Negative for adenopathy. Does not bruise/bleed easily.  Psychiatric/Behavioral: Negative for behavioral problems and confusion. The patient is not nervous/anxious.     Past Medical History:  Diagnosis Date  . Colitis, ulcerative ([place]) Dx'd 2001   Dr. Erlene Quan; [facility]--has been in remission since about 2009  . Diabetes mellitus without complication ([place]) 50 yrs old   type 2 (dx'd after long courses of prednisone for his UC)  . Erectile dysfunction   . Family history of tinea corporis 07/25/2012  . Hyperlipidemia   . Hypertension   . Insomnia   . Sinusitis 08/28/2015       Social History   Social History  . Marital status: Married    Spouse name: N/A  . Number of children: N/A  . Years of education: N/A   Occupational History  . Not on file.   Social History Main Topics  . Smoking status: Never Smoker  . Smokeless tobacco: Never Used  . Alcohol use No  . Drug use: No  . 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 Not on file   Social History Narrative   Married, 52 y/o girl.     Lives in [place], grew up in [place]--relocated to [facility] [place].   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          Past Surgical History:  Procedure Laterality Date  . TRIGGER FINGER RELEASE  2011   b/l middle fingers    Family History  Problem Relation Age of Onset  . Diabetes Father   . Hyperlipidemia Father   . Hypertension Father   . Kidney disease Father     Kidney Failure  . Heart disease Father 67    MI  . Hypertension Sister   . Hypertension Brother   . Diabetes Paternal Grandfather   . Hypertension Brother   . Hyperlipidemia Brother   . Heart disease Brother     Valve Surgery ?  Marland Kitchen  Diabetes Brother   . Hypertension Brother   . ADD / ADHD Brother     Allergies  Allergen Reactions  . Simvastatin     Myalgias    Current Outpatient Prescriptions on File Prior to Visit  Medication Sig Dispense Refill  . albuterol (PROVENTIL HFA;VENTOLIN HFA) 108 (90 Base) MCG/ACT inhaler Inhale 2 puffs into the lungs every 6 (six) hours as needed for wheezing or shortness of breath. 1 Inhaler 0  . ALPRAZolam (XANAX) 0.5 MG tablet Take 1 tablet (0.5 mg total) by mouth 2 (two) times daily as needed for anxiety. 60 tablet 1  . azaTHIOprine (IMURAN) 50 MG tablet Take 4.5 tabs daily 135 tablet 5  . balsalazide (COLAZAL) 750 MG capsule Take 3 capsules (2,250 mg total) by mouth 3 (three) times daily. 90 capsule  5  . busPIRone (BUSPAR) 5 MG tablet Take 5 mg by mouth 2 (two) times daily.    . Dulaglutide (TRULICITY) 1.5 BB/0.4UG SOPN Inject 1.5 mg into the skin daily. Failed Victoza 30 pen 5  . fenofibrate 160 MG tablet Take 1 tablet (160 mg total) by mouth daily. 30 tablet 6  . fluticasone (FLONASE) 50 MCG/ACT nasal spray Place 2 sprays into both nostrils daily. 16 g 1  . glimepiride (AMARYL) 4 MG tablet Take 1 tablet (4 mg total) by mouth 2 (two) times daily. 60 tablet 6  . Insulin Detemir (LEVEMIR) 100 UNIT/ML Pen Inject 15 Units into the skin daily at 10 pm. 15 mL 2  . Insulin Pen Needle (DROPLET PEN NEEDLES) 32G X 4 MM MISC Use 1x a day 100 each 11  . insulin regular (NOVOLIN R,HUMULIN R) 100 units/mL injection Inject 0.05-0.1 mLs (5-10 Units total) into the skin 3 (three) times daily before meals. 10 mL 11  . losartan (COZAAR) 50 MG tablet Take 1 tablet (50 mg total) by mouth daily. 30 tablet 6  . meloxicam (MOBIC) 15 MG tablet Take 15 mg by mouth daily.    . metFORMIN (GLUCOPHAGE) 500 MG tablet Take 2 tablets (1,000 mg total) by mouth 2 (two) times daily with a meal. 180 tablet 1  . metoprolol succinate (TOPROL-XL) 50 MG 24 hr tablet Take 1 tablet (50 mg total) by mouth daily. Take with or immediately following a meal. 30 tablet 6  . Pitavastatin Calcium (LIVALO) 4 MG TABS Take 1 tablet (4 mg total) by mouth daily. 30 tablet 6  . tadalafil (CIALIS) 5 MG tablet Take 5 mg by mouth daily as needed.    . traMADol (ULTRAM) 50 MG tablet Take 1 tablet (50 mg total) by mouth every 8 (eight) hours as needed. 40 tablet 0  . valACYclovir (VALTREX) 1000 MG tablet Take 1 tablet (1,000 mg total) by mouth 2 (two) times daily as needed. 30 tablet 1  . zaleplon (SONATA) 10 MG capsule Take 10 mg by mouth at bedtime.     No current facility-administered medications on file prior to visit.     BP 132/80   Pulse 79   Temp 98 F (36.7 C) (Oral)   Ht 5' 6"  (1.676 m)   Wt 179 lb 6.4 oz (81.4 kg)   SpO2 99%   BMI  28.96 kg/m      Objective:   Physical Exam  General Appearance- Not in acute distress.    Chest and Lung Exam Auscultation: Breath sounds:-Normal. Clear even and unlabored. Adventitious sounds:- No Adventitious sounds.  Cardiovascular Auscultation:Rythm - Regular, rate and rythm. Heart Sounds -Normal heart sounds.  Abdomen  Inspection:-Inspection Normal.  Palpation/Perucssion: Palpation and Percussion of the abdomen reveal- Non Tender, No Rebound tenderness, No rigidity(Guarding) and No Palpable abdominal masses.  Liver:-Normal.  Spleen:- Normal.   Back Mid lumbar spine tenderness to palpation. Pain on straight leg lift. Pain on lateral movements and flexion/extension of the spine.  Lower ext neurologic  L5-S1 sensation intact bilaterally. Normal patellar reflexes bilaterally. No foot drop bilaterally.        Assessment & Plan:  For your erectile dysfunction, I wrote your for sildanefil generic. I would investigate [place] price.  For your low back pain. I have talked with Dr. Charlett Blake and we need to get the print prescription she gave you the other day before giving any norco.  LPN did urine dip. Pt was hesitant to admit erectile dysfunction. Thus he told her prostate issue which was not the case.  Follow up in 10 days or as needed  Pt did bring back the tramadol. I shredded and place in shred bin. Rx norco.

## 2016-07-03 NOTE — Addendum Note (Signed)
Addended by: Anabel Halon on: 07/03/2016 10:34 AM   Modules accepted: Orders

## 2016-07-03 NOTE — Telephone Encounter (Signed)
There was a Prior Auth. For the pt. However I found out that the reason may have been that the quantity of pens may have had an effect of why Trulicity was not approved. I changed the quantity from #30 pens/ per 28 days...Marland KitchenMarland KitchenMarland Kitchen to #4 pens/ per 28 days and 1 refill. This information came from the pharmacy. Please advise----PC

## 2016-07-03 NOTE — Patient Instructions (Addendum)
For your erectile dysfunction, I wrote your for sildanefil generic. I would investigate [place] price.  For your low back pain.  I have talked with Dr. Charlett Blake and we need to get the print prescription she gave you the other day before giving any norco. We will then shred the norco rx.  Follow up in 10 days or as needed.  If pain persists/chronic possible pain management per Dr. Charlett Blake. Also would recommend lumbar spine xray.  Pt did bring back the tramadol. I shredded and place in shred bin. Rx norco.

## 2016-07-04 NOTE — Telephone Encounter (Signed)
thanks

## 2016-07-04 NOTE — Telephone Encounter (Signed)
Spoke with patient, he stated that the pharmacy got in touch with him to let him know that the medication was ready for pick up. It was approved. PC

## 2016-07-08 NOTE — Assessment & Plan Note (Signed)
Encouraged heart healthy diet, increase exercise, avoid trans fats, consider a krill oil cap daily 

## 2016-07-08 NOTE — Assessment & Plan Note (Signed)
Encouraged good sleep hygiene such as dark, quiet room. No blue/green glowing lights such as computer screens in bedroom. No alcohol or stimulants in evening. Cut down on caffeine as able. Regular exercise is helpful but not just prior to bed time.  

## 2016-07-08 NOTE — Assessment & Plan Note (Signed)
hgba1c still elevated, minimize simple carbs. Increase exercise as tolerated. Trulicity weekly

## 2016-07-08 NOTE — Assessment & Plan Note (Signed)
Well controlled, no changes to meds. Encouraged heart healthy diet such as the DASH diet and exercise as tolerated.  °

## 2016-07-08 NOTE — Progress Notes (Signed)
Patient ID: Curtis Kennedy, male   DOB: Dec 23, 1965, 50 y.o.   MRN: [id number]   Subjective:    Patient ID: Curtis Kennedy, male    DOB: Jun 12, 1966, 50 y.o.   MRN: [id number]  Chief Complaint  Patient presents with  . Medication Problem    HPI Patient is in today for follow up. No recent illness but continues to note polyuria. No hospitalizations. Has seen Dr Steffanie Dunn of endocrinology. Denies CP/palp/SOB/HA/congestion/fevers/GI or GU c/o. Taking meds as prescribed  Past Medical History:  Diagnosis Date  . Colitis, ulcerative ([place]) Dx'd 2001   Dr. Erlene Quan; [facility]--has been in remission since about 2009  . Diabetes mellitus without complication ([place]) 50 yrs old   type 2 (dx'd after long courses of prednisone for his UC)  . Erectile dysfunction   . Family history of tinea corporis 07/25/2012  . Hyperlipidemia   . Hypertension   . Insomnia   . Sinusitis 08/28/2015    Past Surgical History:  Procedure Laterality Date  . TRIGGER FINGER RELEASE  2011   b/l middle fingers    Family History  Problem Relation Age of Onset  . Diabetes Father   . Hyperlipidemia Father   . Hypertension Father   . Kidney disease Father     Kidney Failure  . Heart disease Father 66    MI  . Hypertension Sister   . Hypertension Brother   . Diabetes Paternal Grandfather   . Hypertension Brother   . Hyperlipidemia Brother   . Heart disease Brother     Valve Surgery ?  . Diabetes Brother   . Hypertension Brother   . ADD / ADHD Brother     Social History   Social History  . Marital status: Married    Spouse name: N/A  . Number of children: N/A  . Years of education: N/A   Occupational History  . Not on file.   Social History Main Topics  . Smoking status: Never Smoker  . Smokeless tobacco: Never Used  . Alcohol use No  . Drug use: No  . 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 Not on file    Social History Narrative   Married, 52 y/o girl.     Lives in [place], grew up in [place]--relocated to [facility] 2007.   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          Outpatient Medications Prior to Visit  Medication Sig Dispense Refill  . albuterol (PROVENTIL HFA;VENTOLIN HFA) 108 (90 Base) MCG/ACT inhaler Inhale 2 puffs into the lungs every 6 (six) hours as needed for wheezing or shortness of breath. 1 Inhaler 0  . azaTHIOprine (IMURAN) 50 MG tablet Take 4.5 tabs daily 135 tablet 5  . balsalazide (COLAZAL) 750 MG capsule Take 3 capsules (2,250 mg total) by mouth 3 (three) times daily. 90 capsule 5  . busPIRone (BUSPAR) 5 MG tablet Take 5 mg by mouth 2 (two) times daily.    . fenofibrate 160 MG tablet Take 1 tablet (160 mg total) by mouth daily. 30 tablet 6  . fluticasone (FLONASE) 50 MCG/ACT nasal spray Place 2 sprays into both nostrils daily. 16 g 1  . glimepiride (AMARYL) 4 MG tablet Take 1 tablet (4 mg total) by mouth 2 (two) times daily.  60 tablet 6  . Insulin Pen Needle (DROPLET PEN NEEDLES) 32G X 4 MM MISC Use 1x a day 100 each 11  . insulin regular (NOVOLIN R,HUMULIN R) 100 units/mL injection Inject 0.05-0.1 mLs (5-10 Units total) into the skin 3 (three) times daily before meals. 10 mL 11  . losartan (COZAAR) 50 MG tablet Take 1 tablet (50 mg total) by mouth daily. 30 tablet 6  . meloxicam (MOBIC) 15 MG tablet Take 15 mg by mouth daily.    . metFORMIN (GLUCOPHAGE) 500 MG tablet Take 2 tablets (1,000 mg total) by mouth 2 (two) times daily with a meal. 180 tablet 1  . metoprolol succinate (TOPROL-XL) 50 MG 24 hr tablet Take 1 tablet (50 mg total) by mouth daily. Take with or immediately following a meal. 30 tablet 6  . Pitavastatin Calcium (LIVALO) 4 MG TABS Take 1 tablet (4 mg total) by mouth daily. 30 tablet 6  . tadalafil (CIALIS) 5 MG tablet Take 5 mg by mouth daily as needed.    . valACYclovir (VALTREX)  1000 MG tablet Take 1 tablet (1,000 mg total) by mouth 2 (two) times daily as needed. 30 tablet 1  . zaleplon (SONATA) 10 MG capsule Take 10 mg by mouth at bedtime.    . Albiglutide (TANZEUM) 30 MG PEN inject 30 mg once a week. 4 each 3  . ALPRAZolam (XANAX) 0.5 MG tablet Take 1 tablet (0.5 mg total) by mouth 2 (two) times daily as needed for anxiety. 60 tablet 0  . azithromycin (ZITHROMAX) 250 MG tablet Take 2 tablets by mouth on day 1, followed by 1 tablet by mouth daily for 4 days. 6 tablet 0  . cefdinir (OMNICEF) 300 MG capsule Take 1 capsule (300 mg total) by mouth 2 (two) times daily. 28 capsule 0  . Dulaglutide (TRULICITY) 1.5 SE/8.3TD SOPN Inject 1.5 mLs into the skin once a week. 12 pen 1  . Exenatide ER (BYDUREON) 2 MG PEN Inject 2 mg weekly 4 each 3  . HYDROcodone-homatropine (HYCODAN) 5-1.5 MG/5ML syrup Take 5 mLs by mouth every 8 (eight) hours as needed for cough. 120 mL 0  . HYDROcodone-homatropine (HYCODAN) 5-1.5 MG/5ML syrup Take 5 mLs by mouth every 8 (eight) hours as needed for cough. 120 mL 0  . Insulin Detemir (LEVEMIR) 100 UNIT/ML Pen Inject 15 Units into the skin daily at 10 pm. 15 mL 2  . Liraglutide (VICTOZA) 18 MG/3ML SOPN Inject 0.6 mg subcutaneous for 7 days, then 1.2 mg subcutaneous for 7 days, then 1.8 mg daily. 6 mL 2  . traMADol (ULTRAM) 50 MG tablet Take by mouth as needed.     No facility-administered medications prior to visit.     Allergies  Allergen Reactions  . Simvastatin     Myalgias    Review of Systems  Constitutional: Positive for malaise/fatigue. Negative for fever.  HENT: Negative for congestion.   Eyes: Negative for blurred vision.  Respiratory: Negative for shortness of breath.   Cardiovascular: Negative for chest pain, palpitations and leg swelling.  Gastrointestinal: Negative for abdominal pain, blood in stool and nausea.  Genitourinary: Positive for frequency. Negative for dysuria.  Musculoskeletal: Negative for falls.  Skin: Negative  for rash.  Neurological: Negative for dizziness, loss of consciousness and headaches.  Endo/Heme/Allergies: Negative for environmental allergies.  Psychiatric/Behavioral: Negative for depression. The patient is not nervous/anxious.        Objective:    Physical Exam  Constitutional: He is oriented to person, place, and time. He  appears well-developed and well-nourished. No distress.  HENT:  Head: Normocephalic and atraumatic.  Nose: Nose normal.  Eyes: Right eye exhibits no discharge. Left eye exhibits no discharge.  Neck: Normal range of motion. Neck supple.  Cardiovascular: Normal rate and regular rhythm.   No murmur heard. Pulmonary/Chest: Effort normal and breath sounds normal.  Abdominal: Soft. Bowel sounds are normal. There is no tenderness.  Musculoskeletal: He exhibits no edema.  Neurological: He is alert and oriented to person, place, and time.  Skin: Skin is warm and dry.  Psychiatric: He has a normal mood and affect.  Nursing note and vitals reviewed.   BP 124/83 (BP Location: Left Arm, Patient Position: Sitting, Cuff Size: Normal)   Pulse 96   Temp 97.9 F (36.6 C) (Oral)   Wt 176 lb 6.4 oz (80 kg)   SpO2 99%   BMI 28.47 kg/m  Wt Readings from Last 3 Encounters:  07/03/16 179 lb 6.4 oz (81.4 kg)  06/28/16 176 lb 6.4 oz (80 kg)  01/26/16 177 lb 6.4 oz (80.5 kg)     Lab Results  Component Value Date   WBC 4.8 06/21/2016   HGB 14.6 06/21/2016   HCT 41.9 06/21/2016   PLT 225.0 06/21/2016   GLUCOSE 427 (H) 06/21/2016   CHOL 157 06/21/2016   TRIG 159.0 (H) 06/21/2016   HDL 41.90 06/21/2016   LDLCALC 84 06/21/2016   ALT 24 06/21/2016   AST 21 06/21/2016   NA 134 (L) 06/21/2016   K 4.1 06/21/2016   CL 100 06/21/2016   CREATININE 0.90 06/21/2016   BUN 15 06/21/2016   CO2 27 06/21/2016   TSH 2.59 06/21/2016   PSA 0.87 04/22/2015   HGBA1C 10.7 (H) 06/21/2016   MICROALBUR 7.3 (H) 11/24/2015    Lab Results  Component Value Date   TSH 2.59 06/21/2016    Lab Results  Component Value Date   WBC 4.8 06/21/2016   HGB 14.6 06/21/2016   HCT 41.9 06/21/2016   MCV 92.9 06/21/2016   PLT 225.0 06/21/2016   Lab Results  Component Value Date   NA 134 (L) 06/21/2016   K 4.1 06/21/2016   CO2 27 06/21/2016   GLUCOSE 427 (H) 06/21/2016   BUN 15 06/21/2016   CREATININE 0.90 06/21/2016   BILITOT 1.0 06/21/2016   ALKPHOS 55 06/21/2016   AST 21 06/21/2016   ALT 24 06/21/2016   PROT 7.4 06/21/2016   ALBUMIN 4.4 06/21/2016   CALCIUM 9.2 06/21/2016   GFR 94.30 06/21/2016   Lab Results  Component Value Date   CHOL 157 06/21/2016   Lab Results  Component Value Date   HDL 41.90 06/21/2016   Lab Results  Component Value Date   LDLCALC 84 06/21/2016   Lab Results  Component Value Date   TRIG 159.0 (H) 06/21/2016   Lab Results  Component Value Date   CHOLHDL 4 06/21/2016   Lab Results  Component Value Date   HGBA1C 10.7 (H) 06/21/2016       Assessment & Plan:   Problem List Items Addressed This Visit    Hypertension    Well controlled, no changes to meds. Encouraged heart healthy diet such as the DASH diet and exercise as tolerated.       Relevant Medications   ALPRAZolam (XANAX) 0.5 MG tablet   Hyperlipidemia    Encouraged heart healthy diet, increase exercise, avoid trans fats, consider a krill oil cap daily      Relevant Medications   ALPRAZolam Duanne Moron)  0.5 MG tablet   Insomnia    Encouraged good sleep hygiene such as dark, quiet room. No blue/green glowing lights such as computer screens in bedroom. No alcohol or stimulants in evening. Cut down on caffeine as able. Regular exercise is helpful but not just prior to bed time.       Uncontrolled type 2 diabetes mellitus without complication, without long-term current use of insulin ([place])    hgba1c still elevated, minimize simple carbs. Increase exercise as tolerated. Trulicity weekly      Relevant Medications   Insulin Detemir (LEVEMIR) 100 UNIT/ML Pen    ALPRAZolam (XANAX) 0.5 MG tablet   Other Relevant Orders   Ambulatory referral to Podiatry    Other Visit Diagnoses   None.     I have discontinued Mr. Tennis Must Kennedy's traMADol, HYDROcodone-homatropine, Dulaglutide, cefdinir, HYDROcodone-homatropine, azithromycin, Exenatide ER, Albiglutide, and Liraglutide. I am also having him maintain his meloxicam, tadalafil, zaleplon, busPIRone, insulin regular, valACYclovir, Insulin Pen Needle, Pitavastatin Calcium, metoprolol succinate, losartan, glimepiride, fenofibrate, balsalazide, fluticasone, albuterol, metFORMIN, azaTHIOprine, Insulin Detemir, and ALPRAZolam.  Meds ordered this encounter  Medications  . DISCONTD: Dulaglutide (TRULICITY) 1.5 BO/4.7QS SOPN    Sig: Inject 1.5 mg into the skin daily. Failed Victoza    Dispense:  30 pen    Refill:  5  . Insulin Detemir (LEVEMIR) 100 UNIT/ML Pen    Sig: Inject 15 Units into the skin daily at 10 pm.    Dispense:  15 mL    Refill:  2  . ALPRAZolam (XANAX) 0.5 MG tablet    Sig: Take 1 tablet (0.5 mg total) by mouth 2 (two) times daily as needed for anxiety.    Dispense:  60 tablet    Refill:  1  . DISCONTD: traMADol (ULTRAM) 50 MG tablet    Sig: Take 1 tablet (50 mg total) by mouth every 8 (eight) hours as needed.    Dispense:  40 tablet    Refill:  0     Penni Homans, MD

## 2016-07-11 ENCOUNTER — Telehealth: Payer: Self-pay

## 2016-07-11 NOTE — Telephone Encounter (Signed)
Called and left message advising patient to return call regard switching to Trulicity. Left call back number.

## 2016-07-17 ENCOUNTER — Ambulatory Visit (INDEPENDENT_AMBULATORY_CARE_PROVIDER_SITE_OTHER): Payer: BLUE CROSS/BLUE SHIELD | Admitting: Podiatry

## 2016-07-17 ENCOUNTER — Encounter: Payer: Self-pay | Admitting: Podiatry

## 2016-07-17 DIAGNOSIS — Z0189 Encounter for other specified special examinations: Secondary | ICD-10-CM

## 2016-07-17 DIAGNOSIS — E119 Type 2 diabetes mellitus without complications: Secondary | ICD-10-CM | POA: Diagnosis not present

## 2016-07-17 DIAGNOSIS — E1165 Type 2 diabetes mellitus with hyperglycemia: Secondary | ICD-10-CM | POA: Diagnosis not present

## 2016-07-17 DIAGNOSIS — IMO0001 Reserved for inherently not codable concepts without codable children: Secondary | ICD-10-CM

## 2016-07-17 NOTE — Progress Notes (Signed)
Subjective:     Patient ID: Curtis Kennedy, male   DOB: April 06, 1966, 50 y.o.   MRN: [id number]  HPI 50 year old male presents the office today for diabetic risk evaluation. He denies any problems to his feet. His last  A1c was 10.7. He denies any claudication symptoms and denies any numbness or tingling to his feet. He has history of a cyst of right big toe but is not not had any issues with this recently.  Review of Systems  All other systems reviewed and are negative.      Objective:   Physical Exam General: AAO x3, NAD  Dermatological: Skin is warm, dry and supple bilateral. Nails x 10 are well manicured; remaining integument appears unremarkable at this time. There are no open sores, no preulcerative lesions, no rash or signs of infection present.  Vascular: Dorsalis Pedis artery and Posterior Tibial artery pedal pulses are 2/4 bilateral with immedate capillary fill time. Pedal hair growth present. There is no pain with calf compression, swelling, warmth, erythema.   Neruologic: Grossly intact via light touch bilateral. Vibratory intact via tuning fork bilateral. Protective threshold with Semmes Wienstein monofilament intact to all pedal sites bilateral. Patellar and Achilles deep tendon reflexes 2+ bilateral.   Musculoskeletal: No gross boney pedal deformities bilateral. No pain, crepitus, or limitation noted with foot and ankle range of motion bilateral. Muscular strength 5/5 in all groups tested bilateral.  Gait: Unassisted, Nonantalgic.      Assessment:     Uncontrolled diabetic presents for diabetic risk assessment    Plan:     -Treatment options discussed including all alternatives, risks, and complications -Etiology of symptoms were discussed -Declined nail trim today -Discussed daily foot inspection. Also discuss appropriate shoegear. -Follow-up in 1 year. Encouraged to call sooner if any issues arise.   Celesta Gentile, DPM

## 2016-07-17 NOTE — Patient Instructions (Signed)
Diabetes and Foot Care Diabetes may cause you to have problems because of poor blood supply (circulation) to your feet and legs. This may cause the skin on your feet to become thinner, break easier, and heal more slowly. Your skin may become dry, and the skin may peel and crack. You may also have nerve damage in your legs and feet causing decreased feeling in them. You may not notice minor injuries to your feet that could lead to infections or more serious problems. Taking care of your feet is one of the most important things you can do for yourself.  HOME CARE INSTRUCTIONS  Wear shoes at all times, even in the house. Do not go barefoot. Bare feet are easily injured.  Check your feet daily for blisters, cuts, and redness. If you cannot see the bottom of your feet, use a mirror or ask someone for help.  Wash your feet with warm water (do not use hot water) and mild soap. Then pat your feet and the areas between your toes until they are completely dry. Do not soak your feet as this can dry your skin.  Apply a moisturizing lotion or petroleum jelly (that does not contain alcohol and is unscented) to the skin on your feet and to dry, brittle toenails. Do not apply lotion between your toes.  Trim your toenails straight across. Do not dig under them or around the cuticle. File the edges of your nails with an emery board or nail file.  Do not cut corns or calluses or try to remove them with medicine.  Wear clean socks or stockings every day. Make sure they are not too tight. Do not wear knee-high stockings since they may decrease blood flow to your legs.  Wear shoes that fit properly and have enough cushioning. To break in new shoes, wear them for just a few hours a day. This prevents you from injuring your feet. Always look in your shoes before you put them on to be sure there are no objects inside.  Do not cross your legs. This may decrease the blood flow to your feet.  If you find a minor scrape,  cut, or break in the skin on your feet, keep it and the skin around it clean and dry. These areas may be cleansed with mild soap and water. Do not cleanse the area with peroxide, alcohol, or iodine.  When you remove an adhesive bandage, be sure not to damage the skin around it.  If you have a wound, look at it several times a day to make sure it is healing.  Do not use heating pads or hot water bottles. They may burn your skin. If you have lost feeling in your feet or legs, you may not know it is happening until it is too late.  Make sure your health care provider performs a complete foot exam at least annually or more often if you have foot problems. Report any cuts, sores, or bruises to your health care provider immediately. SEEK MEDICAL CARE IF:   You have an injury that is not healing.  You have cuts or breaks in the skin.  You have an ingrown nail.  You notice redness on your legs or feet.  You feel burning or tingling in your legs or feet.  You have pain or cramps in your legs and feet.  Your legs or feet are numb.  Your feet always feel cold. SEEK IMMEDIATE MEDICAL CARE IF:   There is increasing redness,   swelling, or pain in or around a wound.  There is a red line that goes up your leg.  Pus is coming from a wound.  You develop a fever or as directed by your health care provider.  You notice a bad smell coming from an ulcer or wound.   This information is not intended to replace advice given to you by your health care provider. Make sure you discuss any questions you have with your health care provider.   Document Released: 09/28/2000 Document Revised: 06/03/2013 Document Reviewed: 03/10/2013 Elsevier Interactive Patient Education 2016 Elsevier Inc.  

## 2016-07-30 MED FILL — TRULICITY 1.5 MG/0.5 ML PEN: 1.5 | 28 days supply | Qty: 2 | Fill #1

## 2016-08-07 ENCOUNTER — Encounter: Payer: Self-pay | Admitting: Family Medicine

## 2016-08-07 ENCOUNTER — Ambulatory Visit (INDEPENDENT_AMBULATORY_CARE_PROVIDER_SITE_OTHER): Payer: BLUE CROSS/BLUE SHIELD | Admitting: Family Medicine

## 2016-08-07 DIAGNOSIS — I10 Essential (primary) hypertension: Secondary | ICD-10-CM | POA: Diagnosis not present

## 2016-08-07 DIAGNOSIS — M545 Low back pain, unspecified: Secondary | ICD-10-CM | POA: Insufficient documentation

## 2016-08-07 DIAGNOSIS — E1165 Type 2 diabetes mellitus with hyperglycemia: Secondary | ICD-10-CM | POA: Diagnosis not present

## 2016-08-07 DIAGNOSIS — IMO0001 Reserved for inherently not codable concepts without codable children: Secondary | ICD-10-CM

## 2016-08-07 DIAGNOSIS — Z23 Encounter for immunization: Secondary | ICD-10-CM | POA: Diagnosis not present

## 2016-08-07 DIAGNOSIS — E782 Mixed hyperlipidemia: Secondary | ICD-10-CM | POA: Diagnosis not present

## 2016-08-07 DIAGNOSIS — G8929 Other chronic pain: Secondary | ICD-10-CM

## 2016-08-07 HISTORY — DX: Low back pain, unspecified: M54.50

## 2016-08-07 MED ORDER — TRAMADOL HCL 50 MG PO TABS: 50.0000 mg | ORAL_TABLET | Freq: Three times a day (TID) | ORAL | 0 refills | Status: DC | PRN

## 2016-08-07 NOTE — Patient Instructions (Signed)
Shoot for blood sugar 90 -140  Basic Carbohydrate Counting for Diabetes Mellitus Carbohydrate counting is a method for keeping track of the amount of carbohydrates you eat. Eating carbohydrates naturally increases the level of sugar (glucose) in your blood, so it is important for you to know the amount that is okay for you to have in every meal. Carbohydrate counting helps keep the level of glucose in your blood within normal limits. The amount of carbohydrates allowed is different for every person. A dietitian can help you calculate the amount that is right for you. Once you know the amount of carbohydrates you can have, you can count the carbohydrates in the foods you want to eat. Carbohydrates are found in the following foods:  Grains, such as breads and cereals.  Dried beans and soy products.  Starchy vegetables, such as potatoes, peas, and corn.  Fruit and fruit juices.  Milk and yogurt.  Sweets and snack foods, such as cake, cookies, candy, chips, soft drinks, and fruit drinks. CARBOHYDRATE COUNTING There are two ways to count the carbohydrates in your food. You can use either of the methods or a combination of both. Reading the "Nutrition Facts" on [place] The "Nutrition Facts" is an area that is included on the labels of almost all packaged food and beverages in the [place]. It includes the serving size of that food or beverage and information about the nutrients in each serving of the food, including the grams (g) of carbohydrate per serving.  Decide the number of servings of this food or beverage that you will be able to eat or drink. Multiply that number of servings by the number of grams of carbohydrate that is listed on the label for that serving. The total will be the amount of carbohydrates you will be having when you eat or drink this food or beverage. Learning Standard Serving Sizes of Food When you eat food that is not packaged or does not include "Nutrition  Facts" on the label, you need to measure the servings in order to count the amount of carbohydrates.A serving of most carbohydrate-rich foods contains about 15 g of carbohydrates. The following list includes serving sizes of carbohydrate-rich foods that provide 15 g ofcarbohydrate per serving:   1 slice of bread (1 oz) or 1 six-inch tortilla.    of a hamburger bun or English muffin.  4-6 crackers.   cup unsweetened dry cereal.    cup hot cereal.   cup rice or pasta.    cup mashed potatoes or  of a large baked potato.  1 cup fresh fruit or one small piece of fruit.    cup canned or frozen fruit or fruit juice.  1 cup milk.   cup plain fat-free yogurt or yogurt sweetened with artificial sweeteners.   cup cooked dried beans or starchy vegetable, such as peas, corn, or potatoes.  Decide the number of standard-size servings that you will eat. Multiply that number of servings by 15 (the grams of carbohydrates in that serving). For example, if you eat 2 cups of strawberries, you will have eaten 2 servings and 30 g of carbohydrates (2 servings x 15 g = 30 g). For foods such as soups and casseroles, in which more than one food is mixed in, you will need to count the carbohydrates in each food that is included. EXAMPLE OF CARBOHYDRATE COUNTING Sample Dinner  3 oz chicken breast.   cup of brown rice.   cup of corn.  1 cup  milk.   1 cup strawberries with sugar-free whipped topping.  Carbohydrate Calculation Step 1: Identify the foods that contain carbohydrates:   Rice.   Corn.   Milk.   Strawberries. Step 2:Calculate the number of servings eaten of each:   2 servings of rice.   1 serving of corn.   1 serving of milk.   1 serving of strawberries. Step 3: Multiply each of those number of servings by 15 g:   2 servings of rice x 15 g = 30 g.   1 serving of corn x 15 g = 15 g.   1 serving of milk x 15 g = 15 g.   1 serving of  strawberries x 15 g = 15 g. Step 4: Add together all of the amounts to find the total grams of carbohydrates eaten: 30 g + 15 g + 15 g + 15 g = 75 g.   This information is not intended to replace advice given to you by your health care provider. Make sure you discuss any questions you have with your health care provider.   Document Released: 10/01/2005 Document Revised: 10/22/2014 Document Reviewed: 08/28/2013 Elsevier Interactive Patient Education [employer].

## 2016-08-07 NOTE — Progress Notes (Signed)
Pre visit review using our clinic review tool, if applicable. No additional management support is needed unless otherwise documented below in the visit note. 

## 2016-08-12 NOTE — Assessment & Plan Note (Signed)
Encouraged heart healthy diet, increase exercise, avoid trans fats, consider a krill oil cap daily 

## 2016-08-12 NOTE — Assessment & Plan Note (Signed)
Well controlled, no changes to meds. Encouraged heart healthy diet such as the DASH diet and exercise as tolerated.  °

## 2016-08-12 NOTE — Progress Notes (Signed)
Patient ID: Curtis Kennedy, male   DOB: 1966/09/15, 50 y.o.   MRN: [id number]   Subjective:    Patient ID: Curtis Kennedy, male    DOB: 1966/08/22, 50 y.o.   MRN: [id number]  Chief Complaint  Patient presents with  . Follow-up    HPI Patient is in today for follow up. He notes he is feeling much better since starting insulin. He denies polyuria or polydipsia. He is having a great improvement in his blood sugars. His fatigue and blurry vistion have also improved. Denies CP/palp/SOB/HA/congestion/fevers/GI or GU c/o. Taking meds as prescribed  Past Medical History:  Diagnosis Date  . Colitis, ulcerative ([place]) Dx'd 2001   Dr. Erlene Quan; [facility]--has been in remission since about 2009  . Diabetes mellitus without complication ([place]) 50 yrs old   type 2 (dx'd after long courses of prednisone for his UC)  . Erectile dysfunction   . Family history of tinea corporis 07/25/2012  . Hyperlipidemia   . Hypertension   . Insomnia   . Low back pain 08/07/2016  . Sinusitis 08/28/2015    Past Surgical History:  Procedure Laterality Date  . TRIGGER FINGER RELEASE  2011   b/l middle fingers    Family History  Problem Relation Age of Onset  . Diabetes Father   . Hyperlipidemia Father   . Hypertension Father   . Kidney disease Father     Kidney Failure  . Heart disease Father 4    MI  . Hypertension Sister   . Hypertension Brother   . Diabetes Paternal Grandfather   . Hypertension Brother   . Hyperlipidemia Brother   . Heart disease Brother     Valve Surgery ?  . Diabetes Brother   . Hypertension Brother   . ADD / ADHD Brother     Social History   Social History  . Marital status: Married    Spouse name: N/A  . Number of children: N/A  . Years of education: N/A   Occupational History  . Not on file.   Social History Main Topics  . Smoking status: Never Smoker  . Smokeless tobacco: Never Used  . Alcohol use No  . Drug use: No  . 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 Not on file   Social History Narrative   Married, 52 y/o girl.     Lives in [place], grew up in [place]--relocated to [facility] 2007.   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          Outpatient Medications Prior to Visit  Medication Sig Dispense Refill  . albuterol (PROVENTIL HFA;VENTOLIN HFA) 108 (90 Base) MCG/ACT inhaler Inhale 2 puffs into the lungs every 6 (six) hours as needed for wheezing or shortness of breath. 1 Inhaler 0  . ALPRAZolam (XANAX) 0.5 MG tablet Take 1 tablet (0.5 mg total) by mouth 2 (two) times daily as needed for anxiety. 60 tablet 1  . azaTHIOprine (IMURAN) 50 MG tablet Take 4.5 tabs daily 135 tablet 5  . balsalazide (COLAZAL) 750 MG capsule Take 3 capsules (2,250 mg total) by mouth 3 (three) times daily. 90 capsule 5  . busPIRone (BUSPAR) 5 MG tablet Take 5 mg by mouth 2 (two) times daily.    . Dulaglutide (TRULICITY) 1.5 GH/8.2XH SOPN  Inject 1.5 mg into the skin daily. Failed Victoza 4 pen 5  . fenofibrate 160 MG tablet Take 1 tablet (160 mg total) by mouth daily. 30 tablet 6  . fluticasone (FLONASE) 50 MCG/ACT nasal spray Place 2 sprays into both nostrils daily. 16 g 1  . glimepiride (AMARYL) 4 MG tablet Take 1 tablet (4 mg total) by mouth 2 (two) times daily. 60 tablet 6  . HYDROcodone-acetaminophen (NORCO) 5-325 MG tablet Take 1 tablet by mouth every 6 (six) hours as needed for moderate pain. 20 tablet 0  . Insulin Detemir (LEVEMIR) 100 UNIT/ML Pen Inject 15 Units into the skin daily at 10 pm. 15 mL 2  . Insulin Pen Needle (DROPLET PEN NEEDLES) 32G X 4 MM MISC Use 1x a day 100 each 11  . insulin regular (NOVOLIN R,HUMULIN R) 100 units/mL injection Inject 0.05-0.1 mLs (5-10 Units total) into the skin 3 (three) times daily before meals. 10 mL 11  . losartan (COZAAR) 50 MG tablet Take 1 tablet (50 mg  total) by mouth daily. 30 tablet 6  . meloxicam (MOBIC) 15 MG tablet Take 15 mg by mouth daily.    . metFORMIN (GLUCOPHAGE) 500 MG tablet Take 2 tablets (1,000 mg total) by mouth 2 (two) times daily with a meal. 180 tablet 1  . metoprolol succinate (TOPROL-XL) 50 MG 24 hr tablet Take 1 tablet (50 mg total) by mouth daily. Take with or immediately following a meal. 30 tablet 6  . Pitavastatin Calcium (LIVALO) 4 MG TABS Take 1 tablet (4 mg total) by mouth daily. 30 tablet 6  . sildenafil (REVATIO) 20 MG tablet 1 tab po 2-5 tabs po as needed for sexual activity. 50 tablet 0  . tadalafil (CIALIS) 5 MG tablet Take 5 mg by mouth daily as needed.    . valACYclovir (VALTREX) 1000 MG tablet Take 1 tablet (1,000 mg total) by mouth 2 (two) times daily as needed. 30 tablet 1  . zaleplon (SONATA) 10 MG capsule Take 10 mg by mouth at bedtime.     No facility-administered medications prior to visit.     Allergies  Allergen Reactions  . Simvastatin     Myalgias    Review of Systems  Constitutional: Negative for chills, fever and malaise/fatigue.  HENT: Negative for congestion and hearing loss.   Eyes: Negative for discharge.  Respiratory: Negative for cough, sputum production and shortness of breath.   Cardiovascular: Negative for chest pain, palpitations and leg swelling.  Gastrointestinal: Negative for abdominal pain, blood in stool, constipation, diarrhea, heartburn, nausea and vomiting.  Genitourinary: Negative for dysuria, frequency, hematuria and urgency.  Musculoskeletal: Negative for back pain, falls and myalgias.  Skin: Negative for rash.  Neurological: Negative for dizziness, sensory change, loss of consciousness, weakness and headaches.  Endo/Heme/Allergies: Negative for environmental allergies. Does not bruise/bleed easily.  Psychiatric/Behavioral: Negative for depression and suicidal ideas. The patient is not nervous/anxious and does not have insomnia.        Objective:    Physical  Exam  Constitutional: He is oriented to person, place, and time. He appears well-developed and well-nourished. No distress.  HENT:  Head: Normocephalic and atraumatic.  Nose: Nose normal.  Eyes: Right eye exhibits no discharge. Left eye exhibits no discharge.  Neck: Normal range of motion. Neck supple.  Cardiovascular: Normal rate and regular rhythm.   No murmur heard. Pulmonary/Chest: Effort normal and breath sounds normal.  Abdominal: Soft. Bowel sounds are normal. There is no tenderness.  Musculoskeletal: He exhibits no  edema.  Neurological: He is alert and oriented to person, place, and time.  Skin: Skin is warm and dry.  Psychiatric: He has a normal mood and affect.  Nursing note and vitals reviewed.   BP 120/84 (BP Location: Left Arm, Patient Position: Sitting, Cuff Size: Normal)   Pulse 67   Temp 98.1 F (36.7 C) (Oral)   Ht 5' 6"  (1.676 m)   Wt 183 lb 2 oz (83.1 kg)   SpO2 98%   BMI 29.56 kg/m  Wt Readings from Last 3 Encounters:  08/07/16 183 lb 2 oz (83.1 kg)  07/03/16 179 lb 6.4 oz (81.4 kg)  06/28/16 176 lb 6.4 oz (80 kg)     Lab Results  Component Value Date   WBC 4.8 06/21/2016   HGB 14.6 06/21/2016   HCT 41.9 06/21/2016   PLT 225.0 06/21/2016   GLUCOSE 427 (H) 06/21/2016   CHOL 157 06/21/2016   TRIG 159.0 (H) 06/21/2016   HDL 41.90 06/21/2016   LDLCALC 84 06/21/2016   ALT 24 06/21/2016   AST 21 06/21/2016   NA 134 (L) 06/21/2016   K 4.1 06/21/2016   CL 100 06/21/2016   CREATININE 0.90 06/21/2016   BUN 15 06/21/2016   CO2 27 06/21/2016   TSH 2.59 06/21/2016   PSA 0.87 04/22/2015   HGBA1C 10.7 (H) 06/21/2016   MICROALBUR 7.3 (H) 11/24/2015    Lab Results  Component Value Date   TSH 2.59 06/21/2016   Lab Results  Component Value Date   WBC 4.8 06/21/2016   HGB 14.6 06/21/2016   HCT 41.9 06/21/2016   MCV 92.9 06/21/2016   PLT 225.0 06/21/2016   Lab Results  Component Value Date   NA 134 (L) 06/21/2016   K 4.1 06/21/2016   CO2 27  06/21/2016   GLUCOSE 427 (H) 06/21/2016   BUN 15 06/21/2016   CREATININE 0.90 06/21/2016   BILITOT 1.0 06/21/2016   ALKPHOS 55 06/21/2016   AST 21 06/21/2016   ALT 24 06/21/2016   PROT 7.4 06/21/2016   ALBUMIN 4.4 06/21/2016   CALCIUM 9.2 06/21/2016   GFR 94.30 06/21/2016   Lab Results  Component Value Date   CHOL 157 06/21/2016   Lab Results  Component Value Date   HDL 41.90 06/21/2016   Lab Results  Component Value Date   LDLCALC 84 06/21/2016   Lab Results  Component Value Date   TRIG 159.0 (H) 06/21/2016   Lab Results  Component Value Date   CHOLHDL 4 06/21/2016   Lab Results  Component Value Date   HGBA1C 10.7 (H) 06/21/2016       Assessment & Plan:   Problem List Items Addressed This Visit    Hypertension    Well controlled, no changes to meds. Encouraged heart healthy diet such as the DASH diet and exercise as tolerated.       Hyperlipidemia    Encouraged heart healthy diet, increase exercise, avoid trans fats, consider a krill oil cap daily      Uncontrolled type 2 diabetes mellitus without complication, without long-term current use of insulin ([place])    Numbers improving with Levemir and Novlin R. Follow up with endocrinology and avoid carbohydrates.       Low back pain   Relevant Medications   traMADol (ULTRAM) 50 MG tablet   Other Relevant Orders   DG Lumbar Spine Complete    Other Visit Diagnoses    Encounter for immunization       Relevant Orders  Flu Vaccine QUAD 36+ mos IM (Completed)      I am having Mr. De Kennedy start on traMADol. I am also having him maintain his meloxicam, tadalafil, zaleplon, busPIRone, insulin regular, valACYclovir, Insulin Pen Needle, Pitavastatin Calcium, metoprolol succinate, losartan, glimepiride, fenofibrate, balsalazide, fluticasone, albuterol, metFORMIN, azaTHIOprine, Insulin Detemir, ALPRAZolam, sildenafil, HYDROcodone-acetaminophen, and Dulaglutide.  Meds ordered this encounter  Medications  .  traMADol (ULTRAM) 50 MG tablet    Sig: Take 1 tablet (50 mg total) by mouth every 8 (eight) hours as needed.    Dispense:  60 tablet    Refill:  0     Penni Homans, MD

## 2016-08-12 NOTE — Assessment & Plan Note (Signed)
Numbers improving with Levemir and Novlin R. Follow up with endocrinology and avoid carbohydrates.

## 2016-08-15 ENCOUNTER — Encounter: Payer: Self-pay | Admitting: Family Medicine

## 2016-08-15 ENCOUNTER — Other Ambulatory Visit: Payer: Self-pay

## 2016-08-15 DIAGNOSIS — E785 Hyperlipidemia, unspecified: Secondary | ICD-10-CM

## 2016-08-15 DIAGNOSIS — I10 Essential (primary) hypertension: Secondary | ICD-10-CM

## 2016-08-15 DIAGNOSIS — IMO0001 Reserved for inherently not codable concepts without codable children: Secondary | ICD-10-CM

## 2016-08-15 DIAGNOSIS — E1165 Type 2 diabetes mellitus with hyperglycemia: Secondary | ICD-10-CM

## 2016-08-15 MED ORDER — GLIMEPIRIDE 4 MG PO TABS: 4.0000 mg | ORAL_TABLET | Freq: Two times a day (BID) | ORAL | 6 refills | Status: DC

## 2016-08-15 MED ORDER — METFORMIN HCL 500 MG PO TABS: 1000.0000 mg | ORAL_TABLET | Freq: Two times a day (BID) | ORAL | 1 refills | Status: DC

## 2016-08-15 MED FILL — GLIMEPIRIDE 4 MG TABLET: 4 | 30 days supply | Qty: 60 | Fill #0

## 2016-08-15 MED FILL — metFORMIN HCL 500 MG TABS: 500 | 45 days supply | Qty: 180 | Fill #0

## 2016-08-16 ENCOUNTER — Encounter: Payer: Self-pay | Admitting: Family Medicine

## 2016-08-17 ENCOUNTER — Other Ambulatory Visit: Payer: Self-pay | Admitting: Family Medicine

## 2016-08-17 DIAGNOSIS — IMO0001 Reserved for inherently not codable concepts without codable children: Secondary | ICD-10-CM

## 2016-08-17 DIAGNOSIS — E785 Hyperlipidemia, unspecified: Secondary | ICD-10-CM

## 2016-08-17 DIAGNOSIS — I10 Essential (primary) hypertension: Secondary | ICD-10-CM

## 2016-08-17 DIAGNOSIS — E1165 Type 2 diabetes mellitus with hyperglycemia: Secondary | ICD-10-CM

## 2016-08-17 MED ORDER — GLIMEPIRIDE 4 MG PO TABS: 4.0000 mg | ORAL_TABLET | Freq: Two times a day (BID) | ORAL | 6 refills | Status: DC

## 2016-08-17 MED ORDER — METFORMIN HCL 500 MG PO TABS: 1000.0000 mg | ORAL_TABLET | Freq: Two times a day (BID) | ORAL | 1 refills | Status: DC

## 2016-08-22 ENCOUNTER — Ambulatory Visit (INDEPENDENT_AMBULATORY_CARE_PROVIDER_SITE_OTHER): Payer: BLUE CROSS/BLUE SHIELD | Admitting: Physician Assistant

## 2016-08-22 ENCOUNTER — Encounter: Payer: Self-pay | Admitting: Physician Assistant

## 2016-08-22 VITALS — BP 140/86 | HR 86 | Temp 97.7°F | Resp 16 | Ht 66.0 in | Wt 178.5 lb

## 2016-08-22 DIAGNOSIS — B9689 Other specified bacterial agents as the cause of diseases classified elsewhere: Secondary | ICD-10-CM

## 2016-08-22 DIAGNOSIS — J019 Acute sinusitis, unspecified: Secondary | ICD-10-CM | POA: Diagnosis not present

## 2016-08-22 MED ORDER — AMOXICILLIN-POT CLAVULANATE 875-125 MG PO TABS: 1.0000 | ORAL_TABLET | Freq: Two times a day (BID) | ORAL | 0 refills | Status: DC

## 2016-08-22 MED ORDER — BENZONATATE 100 MG PO CAPS: 100.0000 mg | ORAL_CAPSULE | Freq: Three times a day (TID) | ORAL | 0 refills | Status: DC | PRN

## 2016-08-22 NOTE — Patient Instructions (Signed)
Please take antibiotic as directed.  Increase fluid intake.  Use Saline nasal spray.  Take a daily multivitamin. Take Tessalon as directed for cough.  Place a humidifier in the bedroom.  Please call or return clinic if symptoms are not improving.  Sinusitis Sinusitis is redness, soreness, and swelling (inflammation) of the paranasal sinuses. Paranasal sinuses are air pockets within the bones of your face (beneath the eyes, the middle of the forehead, or above the eyes). In healthy paranasal sinuses, mucus is able to drain out, and air is able to circulate through them by way of your nose. However, when your paranasal sinuses are inflamed, mucus and air can become trapped. This can allow bacteria and other germs to grow and cause infection. Sinusitis can develop quickly and last only a short time (acute) or continue over a long period (chronic). Sinusitis that lasts for more than 12 weeks is considered chronic.  CAUSES  Causes of sinusitis include:  Allergies.  Structural abnormalities, such as displacement of the cartilage that separates your nostrils (deviated septum), which can decrease the air flow through your nose and sinuses and affect sinus drainage.  Functional abnormalities, such as when the small hairs (cilia) that line your sinuses and help remove mucus do not work properly or are not present. SYMPTOMS  Symptoms of acute and chronic sinusitis are the same. The primary symptoms are pain and pressure around the affected sinuses. Other symptoms include:  Upper toothache.  Earache.  Headache.  Bad breath.  Decreased sense of smell and taste.  A cough, which worsens when you are lying flat.  Fatigue.  Fever.  Thick drainage from your nose, which often is green and may contain pus (purulent).  Swelling and warmth over the affected sinuses. DIAGNOSIS  Your caregiver will perform a physical exam. During the exam, your caregiver may:  Look in your nose for signs of abnormal  growths in your nostrils (nasal polyps).  Tap over the affected sinus to check for signs of infection.  View the inside of your sinuses (endoscopy) with a special imaging device with a light attached (endoscope), which is inserted into your sinuses. If your caregiver suspects that you have chronic sinusitis, one or more of the following tests may be recommended:  Allergy tests.  Nasal culture A sample of mucus is taken from your nose and sent to a lab and screened for bacteria.  Nasal cytology A sample of mucus is taken from your nose and examined by your caregiver to determine if your sinusitis is related to an allergy. TREATMENT  Most cases of acute sinusitis are related to a viral infection and will resolve on their own within 10 days. Sometimes medicines are prescribed to help relieve symptoms (pain medicine, decongestants, nasal steroid sprays, or saline sprays).  However, for sinusitis related to a bacterial infection, your caregiver will prescribe antibiotic medicines. These are medicines that will help kill the bacteria causing the infection.  Rarely, sinusitis is caused by a fungal infection. In theses cases, your caregiver will prescribe antifungal medicine. For some cases of chronic sinusitis, surgery is needed. Generally, these are cases in which sinusitis recurs more than 3 times per year, despite other treatments. HOME CARE INSTRUCTIONS   Drink plenty of water. Water helps thin the mucus so your sinuses can drain more easily.  Use a humidifier.  Inhale steam 3 to 4 times a day (for example, sit in the bathroom with the shower running).  Apply a warm, moist washcloth to your face  3 to 4 times a day, or as directed by your caregiver.  Use saline nasal sprays to help moisten and clean your sinuses.  Take over-the-counter or prescription medicines for pain, discomfort, or fever only as directed by your caregiver. SEEK IMMEDIATE MEDICAL CARE IF:  You have increasing pain or  severe headaches.  You have nausea, vomiting, or drowsiness.  You have swelling around your face.  You have vision problems.  You have a stiff neck.  You have difficulty breathing. MAKE SURE YOU:   Understand these instructions.  Will watch your condition.  Will get help right away if you are not doing well or get worse. Document Released: 10/01/2005 Document Revised: 12/24/2011 Document Reviewed: 10/16/2011 [facility] Patient Information 2014 [place], [place].

## 2016-08-22 NOTE — Progress Notes (Signed)
Pre visit review using our clinic review tool, if applicable. No additional management support is needed unless otherwise documented below in the visit note/SLS  

## 2016-08-22 NOTE — Progress Notes (Signed)
Patient presents to clinic today c/o sinus pressure, sinus pain, ear pain and maxillary facial tenderness x 2 weeks. Endorses chest congestion and cough productive of clear-yellow. Denies objective fever but felt warm the other day. Denies chest pain or SOB. Denies recent travel or sick contact. Has taken OTC Mucinex and Delsym.  Past Medical History:  Diagnosis Date  . Colitis, ulcerative ([place]) Dx'd 2001   Dr. Erlene Quan; [facility]--has been in remission since about 2009  . Diabetes mellitus without complication ([place]) 50 yrs old   type 2 (dx'd after long courses of prednisone for his UC)  . Erectile dysfunction   . Family history of tinea corporis 07/25/2012  . Hyperlipidemia   . Hypertension   . Insomnia   . Low back pain 08/07/2016  . Sinusitis 08/28/2015    Current Outpatient Prescriptions on File Prior to Visit  Medication Sig Dispense Refill  . albuterol (PROVENTIL HFA;VENTOLIN HFA) 108 (90 Base) MCG/ACT inhaler Inhale 2 puffs into the lungs every 6 (six) hours as needed for wheezing or shortness of breath. 1 Inhaler 0  . ALPRAZolam (XANAX) 0.5 MG tablet Take 1 tablet (0.5 mg total) by mouth 2 (two) times daily as needed for anxiety. 60 tablet 1  . azaTHIOprine (IMURAN) 50 MG tablet Take 4.5 tabs daily 135 tablet 5  . balsalazide (COLAZAL) 750 MG capsule Take 3 capsules (2,250 mg total) by mouth 3 (three) times daily. 90 capsule 5  . busPIRone (BUSPAR) 5 MG tablet Take 5 mg by mouth 2 (two) times daily.    . Dulaglutide (TRULICITY) 1.5 NW/2.9FA SOPN Inject 1.5 mg into the skin daily. Failed Victoza 4 pen 5  . fenofibrate 160 MG tablet Take 1 tablet (160 mg total) by mouth daily. 30 tablet 6  . fluticasone (FLONASE) 50 MCG/ACT nasal spray Place 2 sprays into both nostrils daily. 16 g 1  . glimepiride (AMARYL) 4 MG tablet Take 1 tablet (4 mg total) by mouth 2 (two) times daily. 60 tablet 6  . HYDROcodone-acetaminophen (NORCO) 5-325 MG tablet Take 1 tablet by mouth every 6 (six) hours  as needed for moderate pain. 20 tablet 0  . Insulin Detemir (LEVEMIR) 100 UNIT/ML Pen Inject 15 Units into the skin daily at 10 pm. 15 mL 2  . Insulin Pen Needle (DROPLET PEN NEEDLES) 32G X 4 MM MISC Use 1x a day 100 each 11  . insulin regular (NOVOLIN R,HUMULIN R) 100 units/mL injection Inject 0.05-0.1 mLs (5-10 Units total) into the skin 3 (three) times daily before meals. 10 mL 11  . losartan (COZAAR) 50 MG tablet Take 1 tablet (50 mg total) by mouth daily. 30 tablet 6  . meloxicam (MOBIC) 15 MG tablet Take 15 mg by mouth daily.    . metFORMIN (GLUCOPHAGE) 500 MG tablet Take 2 tablets (1,000 mg total) by mouth 2 (two) times daily with a meal. 180 tablet 1  . metoprolol succinate (TOPROL-XL) 50 MG 24 hr tablet Take 1 tablet (50 mg total) by mouth daily. Take with or immediately following a meal. 30 tablet 6  . Pitavastatin Calcium (LIVALO) 4 MG TABS Take 1 tablet (4 mg total) by mouth daily. 30 tablet 6  . sildenafil (REVATIO) 20 MG tablet 1 tab po 2-5 tabs po as needed for sexual activity. 50 tablet 0  . tadalafil (CIALIS) 5 MG tablet Take 5 mg by mouth daily as needed.    . traMADol (ULTRAM) 50 MG tablet Take 1 tablet (50 mg total) by mouth every 8 (  eight) hours as needed. 60 tablet 0  . valACYclovir (VALTREX) 1000 MG tablet Take 1 tablet (1,000 mg total) by mouth 2 (two) times daily as needed. 30 tablet 1  . zaleplon (SONATA) 10 MG capsule Take 10 mg by mouth at bedtime.     No current facility-administered medications on file prior to visit.     Allergies  Allergen Reactions  . Simvastatin     Myalgias    Family History  Problem Relation Age of Onset  . Diabetes Father   . Hyperlipidemia Father   . Hypertension Father   . Kidney disease Father     Kidney Failure  . Heart disease Father 75    MI  . Hypertension Sister   . Hypertension Brother   . Diabetes Paternal Grandfather   . Hypertension Brother   . Hyperlipidemia Brother   . Heart disease Brother     Valve Surgery ?    . Diabetes Brother   . Hypertension Brother   . ADD / ADHD Brother     Social History   Social History  . Marital status: Married    Spouse name: N/A  . Number of children: N/A  . Years of education: N/A   Social History Main Topics  . Smoking status: Never Smoker  . Smokeless tobacco: Never Used  . Alcohol use No  . Drug use: No  . 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 None   Social History Narrative   Married, 52 y/o girl.     Lives in [place], grew up in [place]--relocated to [facility] 2007.   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          Review of Systems - See HPI.  All other ROS are negative.  BP (!) 0/0 (BP Location: Right Arm, Patient Position: Sitting, Cuff Size: Large)   Ht 5' 6"  (1.676 m)   Wt 178 lb 8 oz (81 kg)   BMI 28.81 kg/m   Physical Exam  Constitutional: He is oriented to person, place, and time and well-developed, well-nourished, and in no distress.  HENT:  Head: Normocephalic and atraumatic.  Right Ear: Tympanic membrane normal.  Left Ear: Tympanic membrane normal.  Nose: Mucosal edema and rhinorrhea present. Right sinus exhibits maxillary sinus tenderness. Left sinus exhibits maxillary sinus tenderness.  Mouth/Throat: Uvula is midline, oropharynx is clear and moist and mucous membranes are normal.  Cardiovascular: Normal rate, regular rhythm, normal heart sounds and intact distal pulses.   Pulmonary/Chest: Effort normal and breath sounds normal.  Neurological: He is alert and oriented to person, place, and time.  Vitals reviewed.   Recent Results (from the past 2160 hour(s))  Hemoglobin A1c     Status: Abnormal   Collection Time: 06/21/16  8:05 AM  Result Value Ref Range   Hgb A1c MFr Bld 10.7 (H) 4.6 - 6.5 %    Comment: Glycemic Control Guidelines for People with Diabetes:Non  Diabetic:  <6%Goal of Therapy: <7%Additional Action Suggested:  >8%   Lipid panel     Status: Abnormal   Collection Time: 06/21/16  8:05 AM  Result Value Ref Range   Cholesterol 157 0 - 200 mg/dL    Comment: ATP III Classification       Desirable:  < 200 mg/dL  Borderline High:  200 - 239 mg/dL          High:  > = 240 mg/dL   Triglycerides 159.0 (H) 0.0 - 149.0 mg/dL    Comment: Normal:  <150 mg/dLBorderline High:  150 - 199 mg/dL   HDL 41.90 >39.00 mg/dL   VLDL 31.8 0.0 - 40.0 mg/dL   LDL Cholesterol 84 0 - 99 mg/dL   Total CHOL/HDL Ratio 4     Comment:                Men          Women1/2 Average Risk     3.4          3.3Average Risk          5.0          4.42X Average Risk          9.6          7.13X Average Risk          15.0          11.0                       NonHDL 115.59     Comment: NOTE:  Non-HDL goal should be 30 mg/dL higher than patient's LDL goal (i.e. LDL goal of < 70 mg/dL, would have non-HDL goal of < 100 mg/dL)  Comprehensive metabolic panel     Status: Abnormal   Collection Time: 06/21/16  8:05 AM  Result Value Ref Range   Sodium 134 (L) 135 - 145 mEq/L   Potassium 4.1 3.5 - 5.1 mEq/L   Chloride 100 96 - 112 mEq/L   CO2 27 19 - 32 mEq/L   Glucose, Bld 427 (H) 70 - 99 mg/dL   BUN 15 6 - 23 mg/dL   Creatinine, Ser 0.90 0.40 - 1.50 mg/dL   Total Bilirubin 1.0 0.2 - 1.2 mg/dL   Alkaline Phosphatase 55 39 - 117 U/L   AST 21 0 - 37 U/L   ALT 24 0 - 53 U/L   Total Protein 7.4 6.0 - 8.3 g/dL   Albumin 4.4 3.5 - 5.2 g/dL   Calcium 9.2 8.4 - 10.5 mg/dL   GFR 94.30 >60.00 mL/min  CBC with Differential/Platelet     Status: None   Collection Time: 06/21/16  8:05 AM  Result Value Ref Range   WBC 4.8 4.0 - 10.5 K/uL   RBC 4.51 4.22 - 5.81 Mil/uL   Hemoglobin 14.6 13.0 - 17.0 g/dL   HCT 41.9 39.0 - 52.0 %   MCV 92.9 78.0 - 100.0 fl   MCHC 34.9 30.0 - 36.0 g/dL   RDW 14.2 11.5 - 15.5 %   Platelets 225.0 150.0 - 400.0 K/uL   Neutrophils Relative % 69.9 43.0 -  77.0 %   Lymphocytes Relative 21.9 12.0 - 46.0 %   Monocytes Relative 6.1 3.0 - 12.0 %   Eosinophils Relative 1.7 0.0 - 5.0 %   Basophils Relative 0.4 0.0 - 3.0 %   Neutro Abs 3.4 1.4 - 7.7 K/uL   Lymphs Abs 1.1 0.7 - 4.0 K/uL   Monocytes Absolute 0.3 0.1 - 1.0 K/uL   Eosinophils Absolute 0.1 0.0 - 0.7 K/uL   Basophils Absolute 0.0 0.0 - 0.1 K/uL  TSH     Status: None   Collection Time: 06/21/16  8:05 AM  Result Value Ref Range   TSH 2.59 0.35 - 4.50 uIU/mL  POCT urinalysis dipstick     Status: Abnormal   Collection Time: 07/03/16  8:39 AM  Result Value Ref Range   Color, UA Yellow    Clarity, UA Clear    Glucose, UA 3+    Bilirubin, UA Neg    Ketones, UA Neg    Spec Grav, UA 1.015    Blood, UA Neg    pH, UA 6.0    Protein, UA Neg    Urobilinogen, UA 4.0    Nitrite, UA Neg    Leukocytes, UA Negative Negative    Assessment/Plan: 1. Acute bacterial sinusitis Rx Augmentin.  Increase fluids.  Rest.  Saline nasal spray.  Probiotic.  Mucinex as directed.  Humidifier in bedroom. Tessalon per orders.  Call or return to clinic if symptoms are not improving.  - amoxicillin-clavulanate (AUGMENTIN) 875-125 MG tablet; Take 1 tablet by mouth 2 (two) times daily.  Dispense: 14 tablet; Refill: 0 - benzonatate (TESSALON) 100 MG capsule; Take 1 capsule (100 mg total) by mouth 3 (three) times daily as needed.  Dispense: 30 capsule; Refill: 0   Leeanne Rio, [place]

## 2016-08-27 MED FILL — TRULICITY 1.5 MG/0.5 ML PEN: 1.5 | 28 days supply | Qty: 2 | Fill #2

## 2016-09-18 ENCOUNTER — Telehealth: Payer: Self-pay | Admitting: *Deleted

## 2016-09-18 MED ORDER — INSULIN PEN NEEDLE 32G X 4 MM MISC: 11 refills | Status: DC

## 2016-09-18 NOTE — Telephone Encounter (Signed)
Rx request for BD Pen Needles Nano [id number]; Rx to pharmacy/SLS 12/05

## 2016-09-19 ENCOUNTER — Other Ambulatory Visit: Payer: Self-pay

## 2016-09-19 ENCOUNTER — Encounter: Payer: Self-pay | Admitting: Family Medicine

## 2016-09-19 DIAGNOSIS — IMO0001 Reserved for inherently not codable concepts without codable children: Secondary | ICD-10-CM

## 2016-09-19 DIAGNOSIS — E7849 Other hyperlipidemia: Secondary | ICD-10-CM

## 2016-09-19 DIAGNOSIS — I10 Essential (primary) hypertension: Secondary | ICD-10-CM

## 2016-09-19 DIAGNOSIS — E1165 Type 2 diabetes mellitus with hyperglycemia: Secondary | ICD-10-CM

## 2016-09-19 MED ORDER — PITAVASTATIN CALCIUM 4 MG PO TABS: 1.0000 | ORAL_TABLET | Freq: Every day | ORAL | 6 refills | Status: DC

## 2016-09-19 MED ORDER — FENOFIBRATE 160 MG PO TABS: 160.0000 mg | ORAL_TABLET | Freq: Every day | ORAL | 6 refills | Status: DC

## 2016-09-19 MED ORDER — LOSARTAN POTASSIUM 50 MG PO TABS: 50.0000 mg | ORAL_TABLET | Freq: Every day | ORAL | 6 refills | Status: DC

## 2016-09-19 MED FILL — metFORMIN HCL 500 MG TABS: 500 | 45 days supply | Qty: 180 | Fill #1

## 2016-09-19 MED FILL — TRULICITY 1.5 MG/0.5 ML PEN: 1.5 | 28 days supply | Qty: 2 | Fill #3

## 2016-10-02 ENCOUNTER — Encounter: Payer: Self-pay | Admitting: Family Medicine

## 2016-10-02 ENCOUNTER — Ambulatory Visit (INDEPENDENT_AMBULATORY_CARE_PROVIDER_SITE_OTHER): Payer: BLUE CROSS/BLUE SHIELD | Admitting: Family Medicine

## 2016-10-02 VITALS — BP 118/68 | HR 95 | Temp 97.7°F | Ht 66.0 in | Wt 182.2 lb

## 2016-10-02 DIAGNOSIS — Z1211 Encounter for screening for malignant neoplasm of colon: Secondary | ICD-10-CM | POA: Diagnosis not present

## 2016-10-02 DIAGNOSIS — IMO0001 Reserved for inherently not codable concepts without codable children: Secondary | ICD-10-CM

## 2016-10-02 DIAGNOSIS — E1165 Type 2 diabetes mellitus with hyperglycemia: Secondary | ICD-10-CM | POA: Diagnosis not present

## 2016-10-02 DIAGNOSIS — J329 Chronic sinusitis, unspecified: Secondary | ICD-10-CM

## 2016-10-02 DIAGNOSIS — B001 Herpesviral vesicular dermatitis: Secondary | ICD-10-CM

## 2016-10-02 DIAGNOSIS — E785 Hyperlipidemia, unspecified: Secondary | ICD-10-CM

## 2016-10-02 DIAGNOSIS — I10 Essential (primary) hypertension: Secondary | ICD-10-CM | POA: Diagnosis not present

## 2016-10-02 LAB — LIPID PANEL
CHOL/HDL RATIO: 3
Cholesterol: 132 mg/dL (ref 0–200)
HDL: 41.5 mg/dL (ref >39.00)
LDL Cholesterol: 64 mg/dL (ref 0–99)
NonHDL: 90.82
TRIGLYCERIDES: 134 mg/dL (ref 0.0–149.0)
VLDL: 26.8 mg/dL (ref 0.0–40.0)

## 2016-10-02 LAB — CBC
HEMATOCRIT: 40.9 % (ref 39.0–52.0)
HEMOGLOBIN: 14 g/dL (ref 13.0–17.0)
MCHC: 34.3 g/dL (ref 30.0–36.0)
MCV: 95.3 fl (ref 78.0–100.0)
PLATELETS: 281 10*3/uL (ref 150.0–400.0)
RBC: 4.29 Mil/uL (ref 4.22–5.81)
RDW: 15.8 % — AB (ref 11.5–15.5)
WBC: 3.9 10*3/uL — AB (ref 4.0–10.5)

## 2016-10-02 LAB — COMPREHENSIVE METABOLIC PANEL
ALK PHOS: 78 U/L (ref 39–117)
ALT: 45 U/L (ref 0–53)
AST: 32 U/L (ref 0–37)
Albumin: 4.6 g/dL (ref 3.5–5.2)
BUN: 12 mg/dL (ref 6–23)
CALCIUM: 9.5 mg/dL (ref 8.4–10.5)
CO2: 24 mEq/L (ref 19–32)
Chloride: 102 mEq/L (ref 96–112)
Creatinine, Ser: 0.88 mg/dL (ref 0.40–1.50)
GFR: 96.67 mL/min (ref >60.00)
GLUCOSE: 406 mg/dL — AB (ref 70–99)
POTASSIUM: 4.2 meq/L (ref 3.5–5.1)
Sodium: 136 mEq/L (ref 135–145)
TOTAL PROTEIN: 7.4 g/dL (ref 6.0–8.3)
Total Bilirubin: 0.7 mg/dL (ref 0.2–1.2)

## 2016-10-02 LAB — TSH: TSH: 3.04 u[IU]/mL (ref 0.35–4.50)

## 2016-10-02 LAB — HEMOGLOBIN A1C: Hgb A1c MFr Bld: 8 % — ABNORMAL HIGH (ref 4.6–6.5)

## 2016-10-02 MED ORDER — GLIMEPIRIDE 4 MG PO TABS: 4.0000 mg | ORAL_TABLET | Freq: Two times a day (BID) | ORAL | 6 refills | Status: DC

## 2016-10-02 MED ORDER — METFORMIN HCL 500 MG PO TABS: 1000.0000 mg | ORAL_TABLET | Freq: Two times a day (BID) | ORAL | 6 refills | Status: DC

## 2016-10-02 MED ORDER — ONETOUCH ULTRASOFT LANCETS MISC: 6 refills | Status: DC

## 2016-10-02 MED ORDER — VALACYCLOVIR HCL 1 G PO TABS: 1000.0000 mg | ORAL_TABLET | Freq: Two times a day (BID) | ORAL | 6 refills | Status: DC | PRN

## 2016-10-02 MED ORDER — GLUCOSE BLOOD VI STRP: ORAL_STRIP | 6 refills | Status: DC

## 2016-10-02 NOTE — Assessment & Plan Note (Signed)
Tolerating Trulicity and Levemir. But sugars still running 160s frequently. Notes he takes the Trulicity on Mondays and then Tuesdays run in 130s whereas later in weeks to 160s and 170s. Encouraged to increase by 2 units every 3 days til numbers improve. Want 70 to 120 and 100 to 150 after eating

## 2016-10-02 NOTE — Assessment & Plan Note (Signed)
Encouraged heart healthy diet, increase exercise, avoid trans fats, consider a krill oil cap daily. Tolerating statin

## 2016-10-02 NOTE — Patient Instructions (Addendum)
Encouraged to increase Levemir by 2 units every 3 days til numbers improve. Want 70 to 120 and 100 to 150 after eating. May have to take 2 extra units of Levemir at second half of week. email if questions. Stop titrating up if any numbers below 100 develop Carbohydrate Counting for Diabetes Mellitus, Adult Carbohydrate counting is a method for keeping track of how many carbohydrates you eat. Eating carbohydrates naturally increases the amount of sugar (glucose) in the blood. Counting how many carbohydrates you eat helps keep your blood glucose within normal limits, which helps you manage your diabetes (diabetes mellitus). It is important to know how many carbohydrates you can safely have in each meal. This is different for every person. A diet and nutrition specialist (registered dietitian) can help you make a meal plan and calculate how many carbohydrates you should have at each meal and snack. Carbohydrates are found in the following foods:  Grains, such as breads and cereals.  Dried beans and soy products.  Starchy vegetables, such as potatoes, peas, and corn.  Fruit and fruit juices.  Milk and yogurt.  Sweets and snack foods, such as cake, cookies, candy, chips, and soft drinks. How do I count carbohydrates? There are two ways to count carbohydrates in food. You can use either of the methods or a combination of both. Reading "Nutrition Facts" on packaged food  The "Nutrition Facts" list is included on the labels of almost all packaged foods and beverages in the [place] It includes:  The serving size.  Information about nutrients in each serving, including the grams (g) of carbohydrate per serving. To use the "Nutrition Facts":  Decide how many servings you will have.  Multiply the number of servings by the number of carbohydrates per serving.  The resulting number is the total amount of carbohydrates that you will be having. Learning standard serving sizes of other foods  When you  eat foods containing carbohydrates that are not packaged or do not include "Nutrition Facts" on the label, you need to measure the servings in order to count the amount of carbohydrates:  Measure the foods that you will eat with a food scale or measuring cup, if needed.  Decide how many standard-size servings you will eat.  Multiply the number of servings by 15. Most carbohydrate-rich foods have about 15 g of carbohydrates per serving.  For example, if you eat 8 oz (170 g) of strawberries, you will have eaten 2 servings and 30 g of carbohydrates (2 servings x 15 g = 30 g).  For foods that have more than one food mixed, such as soups and casseroles, you must count the carbohydrates in each food that is included. The following list contains standard serving sizes of common carbohydrate-rich foods. Each of these servings has about 15 g of carbohydrates:   hamburger bun or  English muffin.   oz (15 mL) syrup.   oz (14 g) jelly.  1 slice of bread.  1 six-inch tortilla.  3 oz (85 g) cooked rice or pasta.  4 oz (113 g) cooked dried beans.  4 oz (113 g) starchy vegetable, such as peas, corn, or potatoes.  4 oz (113 g) hot cereal.  4 oz (113 g) mashed potatoes or  of a large baked potato.  4 oz (113 g) canned or frozen fruit.  4 oz (120 mL) fruit juice.  4-6 crackers.  6 chicken nuggets.  6 oz (170 g) unsweetened dry cereal.  6 oz (170 g) plain fat-free  yogurt or yogurt sweetened with artificial sweeteners.  8 oz (240 mL) milk.  8 oz (170 g) fresh fruit or one small piece of fruit.  24 oz (680 g) popped popcorn. Example of carbohydrate counting Sample meal  3 oz (85 g) chicken breast.  6 oz (170 g) brown rice.  4 oz (113 g) corn.  8 oz (240 mL) milk.  8 oz (170 g) strawberries with sugar-free whipped topping. Carbohydrate calculation 1. Identify the foods that contain carbohydrates:  Rice.  Corn.  Milk.  Strawberries. 2. Calculate how many servings  you have of each food:  2 servings rice.  1 serving corn.  1 serving milk.  1 serving strawberries. 3. Multiply each number of servings by 15 g:  2 servings rice x 15 g = 30 g.  1 serving corn x 15 g = 15 g.  1 serving milk x 15 g = 15 g.  1 serving strawberries x 15 g = 15 g. 4. Add together all of the amounts to find the total grams of carbohydrates eaten:  30 g + 15 g + 15 g + 15 g = 75 g of carbohydrates total. This information is not intended to replace advice given to you by your health care provider. Make sure you discuss any questions you have with your health care provider. Document Released: 10/01/2005 Document Revised: 04/20/2016 Document Reviewed: 03/14/2016 Elsevier Interactive Patient Education  2017 [employer].

## 2016-10-02 NOTE — Progress Notes (Signed)
Patient ID: Curtis Kennedy, male   DOB: 31-Jan-1966, 50 y.o.   MRN: [id number]   Subjective:    Patient ID: Curtis Kennedy, male    DOB: 24-Nov-1965, 50 y.o.   MRN: [id number]  Chief Complaint  Patient presents with  . Follow-up    HPI Patient is in today for follow up. No recent illness or acute concerns. As his sugar gets more controlled he is less tired and feeling well. Notes his sugars are 130s to 170s. Is doing well with Trulicity and Levemir. Sugars are lower the day after trulicity. No polyuria or polydipsia. Denies CP/palp/SOB/HA/congestion/fevers/GI or GU c/o. Taking meds as prescribed  Past Medical History:  Diagnosis Date  . 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 Erlene Quan; [facility]--has been in remission since about 2009  . Diabetes mellitus without complication ([place]) 50 yrs old   type 2 (dx'd after long courses of prednisone for his UC)  . Erectile dysfunction   . Family history of tinea corporis 07/25/2012  . Hyperlipidemia   . Hypertension   . Insomnia   . Low back pain 08/07/2016  . Sinusitis 08/28/2015    Past Surgical History:  Procedure Laterality Date  . TRIGGER FINGER RELEASE  2011   b/l middle fingers    Family History  Problem Relation Age of Onset  . Diabetes Father   . Hyperlipidemia Father   . Hypertension Father   . Kidney disease Father     Kidney Failure  . Heart disease Father 76    MI  . Hypertension Sister   . Hypertension Brother   . Diabetes Paternal Grandfather   . Hypertension Brother   . Hyperlipidemia Brother   . Heart disease Brother     Valve Surgery ?  . Diabetes Brother   . Hypertension Brother   . ADD / ADHD Brother     Social History   Social History  . Marital status: Married    Spouse name: N/A  . Number of children: N/A  . Years of education: N/A   Occupational History  . Not on file.   Social History Main Topics  . Smoking status: Never Smoker  . Smokeless tobacco: Never Used  . Alcohol use No    . Drug use: No  . 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 Not on file   Social History Narrative   Married, 53 y/o girl.     Lives in [place], grew up in [place]--relocated to [facility] 2007.   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          Outpatient Medications Prior to Visit  Medication Sig Dispense Refill  . albuterol (PROVENTIL HFA;VENTOLIN HFA) 108 (90 Base) MCG/ACT inhaler Inhale 2 puffs into the lungs every 6 (six) hours as needed for wheezing or shortness of breath. 1 Inhaler 0  . ALPRAZolam (XANAX) 0.5 MG tablet Take 1 tablet (0.5 mg total) by mouth 2 (two) times daily as needed for anxiety. 60 tablet 1  . azaTHIOprine (IMURAN) 50 MG tablet Take 4.5 tabs daily 135 tablet 5  . balsalazide (COLAZAL) 750 MG capsule Take 3 capsules (2,250 mg total) by mouth 3 (three) times daily. 90 capsule 5  . benzonatate (TESSALON) 100 MG capsule Take 1 capsule (100 mg total)  by mouth 3 (three) times daily as needed. 30 capsule 0  . busPIRone (BUSPAR) 5 MG tablet Take 5 mg by mouth 2 (two) times daily.    . Dulaglutide (TRULICITY) 1.5 ME/1.5AX SOPN Inject 1.5 mg into the skin daily. Failed Victoza (Patient taking differently: Inject 1.5 mg into the skin once a week. Failed Victoza) 4 pen 5  . fenofibrate 160 MG tablet Take 1 tablet (160 mg total) by mouth daily. 30 tablet 6  . fluticasone (FLONASE) 50 MCG/ACT nasal spray Place 2 sprays into both nostrils daily. 16 g 1  . HYDROcodone-acetaminophen (NORCO) 5-325 MG tablet Take 1 tablet by mouth every 6 (six) hours as needed for moderate pain. 20 tablet 0  . Insulin Detemir (LEVEMIR) 100 UNIT/ML Pen Inject 15 Units into the skin daily at 10 pm. 15 mL 2  . Insulin Pen Needle (DROPLET PEN NEEDLES) 32G X 4 MM MISC Use 1x a day 100 each 11  . losartan (COZAAR) 50 MG tablet Take 1 tablet (50 mg  total) by mouth daily. 30 tablet 6  . meloxicam (MOBIC) 15 MG tablet Take 15 mg by mouth daily.    . metoprolol succinate (TOPROL-XL) 50 MG 24 hr tablet Take 1 tablet (50 mg total) by mouth daily. Take with or immediately following a meal. 30 tablet 6  . Pitavastatin Calcium (LIVALO) 4 MG TABS Take 1 tablet (4 mg total) by mouth daily. 30 tablet 6  . sildenafil (REVATIO) 20 MG tablet 1 tab po 2-5 tabs po as needed for sexual activity. 50 tablet 0  . tadalafil (CIALIS) 5 MG tablet Take 5 mg by mouth daily as needed.    . traMADol (ULTRAM) 50 MG tablet Take 1 tablet (50 mg total) by mouth every 8 (eight) hours as needed. 60 tablet 0  . zaleplon (SONATA) 10 MG capsule Take 10 mg by mouth at bedtime as needed.     Marland Kitchen glimepiride (AMARYL) 4 MG tablet Take 1 tablet (4 mg total) by mouth 2 (two) times daily. 60 tablet 6  . metFORMIN (GLUCOPHAGE) 500 MG tablet Take 2 tablets (1,000 mg total) by mouth 2 (two) times daily with a meal. 180 tablet 1  . valACYclovir (VALTREX) 1000 MG tablet Take 1 tablet (1,000 mg total) by mouth 2 (two) times daily as needed. 30 tablet 1  . amoxicillin-clavulanate (AUGMENTIN) 875-125 MG tablet Take 1 tablet by mouth 2 (two) times daily. 14 tablet 0   No facility-administered medications prior to visit.     Allergies  Allergen Reactions  . Simvastatin     Myalgias    Review of Systems  Constitutional: Negative for fever and malaise/fatigue.  HENT: Negative for congestion.   Eyes: Negative for blurred vision.  Respiratory: Negative for shortness of breath.   Cardiovascular: Negative for chest pain, palpitations and leg swelling.  Gastrointestinal: Negative for abdominal pain, blood in stool and nausea.  Genitourinary: Negative for dysuria and frequency.  Musculoskeletal: Negative for falls.  Skin: Negative for rash.  Neurological: Negative for dizziness, loss of consciousness and headaches.  Endo/Heme/Allergies: Negative for environmental allergies.    Psychiatric/Behavioral: Negative for depression. The patient is not nervous/anxious.        Objective:    Physical Exam  Constitutional: He is oriented to person, place, and time. He appears well-developed and well-nourished. No distress.  HENT:  Head: Normocephalic and atraumatic.  Eyes: Conjunctivae are normal.  Neck: Neck supple. No thyromegaly present.  Cardiovascular: Normal rate, regular rhythm and normal heart sounds.  No murmur heard. Pulmonary/Chest: Effort normal and breath sounds normal. No respiratory distress. He has no wheezes.  Abdominal: Soft. Bowel sounds are normal. He exhibits no mass. There is no tenderness.  Musculoskeletal: He exhibits no edema.  Lymphadenopathy:    He has no cervical adenopathy.  Neurological: He is alert and oriented to person, place, and time.  Skin: Skin is warm and dry.  Psychiatric: He has a normal mood and affect. His behavior is normal.    BP 118/68 (BP Location: Left Arm, Patient Position: Sitting, Cuff Size: Normal)   Pulse 95   Temp 97.7 F (36.5 C) (Oral)   Ht 5' 6"  (1.676 m)   Wt 182 lb 4 oz (82.7 kg)   SpO2 97%   BMI 29.42 kg/m  Wt Readings from Last 3 Encounters:  10/02/16 182 lb 4 oz (82.7 kg)  08/22/16 178 lb 8 oz (81 kg)  08/07/16 183 lb 2 oz (83.1 kg)     Lab Results  Component Value Date   WBC 4.8 06/21/2016   HGB 14.6 06/21/2016   HCT 41.9 06/21/2016   PLT 225.0 06/21/2016   GLUCOSE 427 (H) 06/21/2016   CHOL 157 06/21/2016   TRIG 159.0 (H) 06/21/2016   HDL 41.90 06/21/2016   LDLCALC 84 06/21/2016   ALT 24 06/21/2016   AST 21 06/21/2016   NA 134 (L) 06/21/2016   K 4.1 06/21/2016   CL 100 06/21/2016   CREATININE 0.90 06/21/2016   BUN 15 06/21/2016   CO2 27 06/21/2016   TSH 2.59 06/21/2016   PSA 0.87 04/22/2015   HGBA1C 10.7 (H) 06/21/2016   MICROALBUR 7.3 (H) 11/24/2015    Lab Results  Component Value Date   TSH 2.59 06/21/2016   Lab Results  Component Value Date   WBC 4.8 06/21/2016    HGB 14.6 06/21/2016   HCT 41.9 06/21/2016   MCV 92.9 06/21/2016   PLT 225.0 06/21/2016   Lab Results  Component Value Date   NA 134 (L) 06/21/2016   K 4.1 06/21/2016   CO2 27 06/21/2016   GLUCOSE 427 (H) 06/21/2016   BUN 15 06/21/2016   CREATININE 0.90 06/21/2016   BILITOT 1.0 06/21/2016   ALKPHOS 55 06/21/2016   AST 21 06/21/2016   ALT 24 06/21/2016   PROT 7.4 06/21/2016   ALBUMIN 4.4 06/21/2016   CALCIUM 9.2 06/21/2016   GFR 94.30 06/21/2016   Lab Results  Component Value Date   CHOL 157 06/21/2016   Lab Results  Component Value Date   HDL 41.90 06/21/2016   Lab Results  Component Value Date   LDLCALC 84 06/21/2016   Lab Results  Component Value Date   TRIG 159.0 (H) 06/21/2016   Lab Results  Component Value Date   CHOLHDL 4 06/21/2016   Lab Results  Component Value Date   HGBA1C 10.7 (H) 06/21/2016       Assessment & Plan:   Problem List Items Addressed This Visit    Hypertension    Well controlled, no changes to meds. Encouraged heart healthy diet such as the DASH diet and exercise as tolerated.       Relevant Medications   metFORMIN (GLUCOPHAGE) 500 MG tablet   glimepiride (AMARYL) 4 MG tablet   Other Relevant Orders   CBC   Comprehensive metabolic panel   TSH   Hyperlipidemia    Encouraged heart healthy diet, increase exercise, avoid trans fats, consider a krill oil cap daily. Tolerating statin  Relevant Medications   metFORMIN (GLUCOPHAGE) 500 MG tablet   glimepiride (AMARYL) 4 MG tablet   Other Relevant Orders   Lipid panel   Sinusitis   Relevant Medications   valACYclovir (VALTREX) 1000 MG tablet   Uncontrolled type 2 diabetes mellitus without complication, without long-term current use of insulin (HCC)    Tolerating Trulicity and Levemir. But sugars still running 160s frequently. Notes he takes the Trulicity on Mondays and then Tuesdays run in 130s whereas later in weeks to 160s and 170s. Encouraged to increase by 2 units every  3 days til numbers improve. Want 70 to 120 and 100 to 150 after eating      Relevant Medications   metFORMIN (GLUCOPHAGE) 500 MG tablet   glimepiride (AMARYL) 4 MG tablet   Other Relevant Orders   Hemoglobin A1c    Other Visit Diagnoses    Colon cancer screening    -  Primary   Relevant Orders   Ambulatory referral to Gastroenterology   Cold sore       Relevant Medications   valACYclovir (VALTREX) 1000 MG tablet      I have discontinued Mr. Tennis Must Kennedy's amoxicillin-clavulanate. I am also having him maintain his meloxicam, tadalafil, zaleplon, busPIRone, metoprolol succinate, balsalazide, fluticasone, albuterol, azaTHIOprine, Insulin Detemir, ALPRAZolam, sildenafil, HYDROcodone-acetaminophen, Dulaglutide, traMADol, benzonatate, Insulin Pen Needle, fenofibrate, losartan, Pitavastatin Calcium, metFORMIN, glimepiride, valACYclovir, onetouch ultrasoft, and glucose blood.  Meds ordered this encounter  Medications  . metFORMIN (GLUCOPHAGE) 500 MG tablet    Sig: Take 2 tablets (1,000 mg total) by mouth 2 (two) times daily with a meal.    Dispense:  180 tablet    Refill:  6  . glimepiride (AMARYL) 4 MG tablet    Sig: Take 1 tablet (4 mg total) by mouth 2 (two) times daily.    Dispense:  60 tablet    Refill:  6  . valACYclovir (VALTREX) 1000 MG tablet    Sig: Take 1 tablet (1,000 mg total) by mouth 2 (two) times daily as needed.    Dispense:  30 tablet    Refill:  6  . DISCONTD: glucose blood (ONE TOUCH ULTRA TEST) test strip    Sig: Use as directed twice daily to check blood sugar.  DX E11.9  . DISCONTD: Lancets (ONETOUCH ULTRASOFT) lancets    Sig: Use as directed twice daily to check blood sugar.  DX E11.9  . Lancets (ONETOUCH ULTRASOFT) lancets    Sig: Use as directed twice daily to check blood sugar.  DX E11.9    Dispense:  100 each    Refill:  6  . glucose blood (ONE TOUCH ULTRA TEST) test strip    Sig: Use as directed twice daily to check blood sugar.  DX E11.9    Dispense:   100 each    Refill:  6     Penni Homans, MD

## 2016-10-02 NOTE — Assessment & Plan Note (Signed)
Well controlled, no changes to meds. Encouraged heart healthy diet such as the DASH diet and exercise as tolerated.  °

## 2016-10-02 NOTE — Progress Notes (Signed)
Pre visit review using our clinic review tool, if applicable. No additional management support is needed unless otherwise documented below in the visit note. 

## 2016-10-17 ENCOUNTER — Other Ambulatory Visit: Payer: Self-pay | Admitting: Family Medicine

## 2016-10-17 MED ORDER — GLUCOSE BLOOD VI STRP: ORAL_STRIP | 4 refills | Status: DC

## 2016-10-17 NOTE — Telephone Encounter (Signed)
Once touch ultra blue test strips not covered. PA or call pharmary for alternative. Did call the pharmacy and the pharmacist checked on approved strips. Will update list and sent in Accu-Chek Aviva plus--pharmacist states they are covered.

## 2016-10-23 MED FILL — TRULICITY 1.5 MG/0.5 ML PEN: 1.5 | 28 days supply | Qty: 2 | Fill #4

## 2016-10-26 ENCOUNTER — Other Ambulatory Visit: Payer: Self-pay | Admitting: Family Medicine

## 2016-10-26 ENCOUNTER — Encounter: Payer: Self-pay | Admitting: Family Medicine

## 2016-10-26 DIAGNOSIS — J329 Chronic sinusitis, unspecified: Secondary | ICD-10-CM

## 2016-10-26 DIAGNOSIS — B001 Herpesviral vesicular dermatitis: Secondary | ICD-10-CM

## 2016-10-26 MED ORDER — VALACYCLOVIR HCL 1 G PO TABS: 1000.0000 mg | ORAL_TABLET | Freq: Two times a day (BID) | ORAL | 6 refills | Status: DC | PRN

## 2016-10-26 NOTE — Telephone Encounter (Signed)
Medication sent over to pharmacy again.  PC

## 2016-10-26 NOTE — Telephone Encounter (Signed)
Caller name: Sixto  Relation to pt: self Call back number: [phone] Pharmacy: [facility] [place], [place]  Reason for call: Pt came in office stating [facility] was senting a request for a rx valACYclovir (VALTREX) 1000 MG tablet, since pt is needing meds, pt was informed that he still has 6 refill but that the pharmacy mentioned to the pt that they had no refills on this prescription. Please advise.

## 2016-10-29 ENCOUNTER — Other Ambulatory Visit: Payer: Self-pay | Admitting: Family Medicine

## 2016-10-29 DIAGNOSIS — E785 Hyperlipidemia, unspecified: Secondary | ICD-10-CM

## 2016-10-29 DIAGNOSIS — I10 Essential (primary) hypertension: Secondary | ICD-10-CM

## 2016-10-29 DIAGNOSIS — IMO0001 Reserved for inherently not codable concepts without codable children: Secondary | ICD-10-CM

## 2016-10-29 DIAGNOSIS — E1165 Type 2 diabetes mellitus with hyperglycemia: Secondary | ICD-10-CM

## 2016-10-29 MED ORDER — BALSALAZIDE DISODIUM 750 MG PO CAPS: 2250.0000 mg | ORAL_CAPSULE | Freq: Three times a day (TID) | ORAL | 1 refills | Status: DC

## 2016-11-02 ENCOUNTER — Encounter: Payer: Self-pay | Admitting: Family Medicine

## 2016-11-07 ENCOUNTER — Encounter: Payer: Self-pay | Admitting: Family Medicine

## 2016-11-08 ENCOUNTER — Other Ambulatory Visit: Payer: Self-pay | Admitting: Family Medicine

## 2016-11-08 MED ORDER — INSULIN DETEMIR 100 UNIT/ML FLEXPEN: PEN_INJECTOR | SUBCUTANEOUS | 2 refills | Status: DC

## 2016-11-08 MED FILL — LEVEMIR FLEXTOUCH 100 UNITS: 100 | 30 days supply | Qty: 15 | Fill #0

## 2016-11-19 MED FILL — TRULICITY 1.5 MG/0.5 ML PEN: 1.5 | 28 days supply | Qty: 2 | Fill #5

## 2016-11-24 ENCOUNTER — Encounter: Payer: Self-pay | Admitting: Family Medicine

## 2016-11-24 LAB — COLOGUARD

## 2016-12-07 ENCOUNTER — Encounter: Payer: Self-pay | Admitting: Medical

## 2016-12-07 ENCOUNTER — Ambulatory Visit (INDEPENDENT_AMBULATORY_CARE_PROVIDER_SITE_OTHER): Payer: BLUE CROSS/BLUE SHIELD | Admitting: Medical

## 2016-12-07 VITALS — BP 122/72 | HR 96 | Temp 98.8°F | Ht 66.0 in | Wt 184.5 lb

## 2016-12-07 DIAGNOSIS — J111 Influenza due to unidentified influenza virus with other respiratory manifestations: Secondary | ICD-10-CM

## 2016-12-07 DIAGNOSIS — R062 Wheezing: Secondary | ICD-10-CM

## 2016-12-07 DIAGNOSIS — J0100 Acute maxillary sinusitis, unspecified: Secondary | ICD-10-CM | POA: Diagnosis not present

## 2016-12-07 DIAGNOSIS — R05 Cough: Secondary | ICD-10-CM | POA: Diagnosis not present

## 2016-12-07 DIAGNOSIS — R059 Cough, unspecified: Secondary | ICD-10-CM

## 2016-12-07 MED ORDER — OSELTAMIVIR PHOSPHATE 75 MG PO CAPS: 75.0000 mg | ORAL_CAPSULE | Freq: Two times a day (BID) | ORAL | 0 refills | Status: DC

## 2016-12-07 MED ORDER — FLUTICASONE PROPIONATE HFA 110 MCG/ACT IN AERO: 2.0000 | INHALATION_SPRAY | Freq: Two times a day (BID) | RESPIRATORY_TRACT | 2 refills | Status: AC

## 2016-12-07 MED ORDER — AZITHROMYCIN 250 MG PO TABS
ORAL_TABLET | ORAL | 0 refills | Status: DC
Start: 2016-12-07 — End: 2016-12-31

## 2016-12-07 MED ORDER — ALBUTEROL SULFATE HFA 108 (90 BASE) MCG/ACT IN AERS: 2.0000 | INHALATION_SPRAY | Freq: Four times a day (QID) | RESPIRATORY_TRACT | 0 refills | Status: DC | PRN

## 2016-12-07 MED ORDER — GUAIFENESIN-CODEINE 100-10 MG/5ML PO SYRP: 5.0000 mL | ORAL_SOLUTION | Freq: Three times a day (TID) | ORAL | 0 refills | Status: DC | PRN

## 2016-12-07 MED ORDER — FLUTICASONE PROPIONATE HFA 110 MCG/ACT IN AERO: 2.0000 | INHALATION_SPRAY | Freq: Two times a day (BID) | RESPIRATORY_TRACT | 2 refills | Status: DC

## 2016-12-07 MED FILL — LEVEMIR FLEXTOUCH 100 UNITS: 100 | 30 days supply | Qty: 15 | Fill #1

## 2016-12-07 NOTE — Progress Notes (Signed)
Pre visit review using our clinic review tool, if applicable. No additional management support is needed unless otherwise documented below in the visit note. 

## 2016-12-07 NOTE — Patient Instructions (Signed)
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 Rx tamiflu.  For cough robitussin with codeine cough syrup.  For sinus infection early and maybe secondary following flu start azithromycin.  For wheezing use albuterol. But if using albuterol frequently then start flovent as welll.  Follow up in 7 days or as needed

## 2016-12-07 NOTE — Progress Notes (Signed)
Subjective:    Patient ID: Curtis Kennedy, male    DOB: 1966-06-10, 51 y.o.   MRN: [id number]  HPI  Pt in states daughter had positive flu test on Tuesday. Pt states he had body aches(started last night), subjective fever, and sweats. Sinus pain and coughing day and night. Pt is diabetic. Pt states cough tablets I gave him area not helping.   Pt estimates 48-72 hours since onset of his body aches.  Pt has been wheezing some last night. Pt states on immune supressant for his ulcereative colitis.   Pt had flu shot this year.    Review of Systems  Constitutional: Negative for chills, fatigue and fever.  HENT: Positive for congestion, sinus pressure and sneezing.   Respiratory: Positive for cough and wheezing. Negative for choking.        Wheezing some last night.  Cardiovascular: Negative for chest pain and palpitations.  Gastrointestinal: Negative for abdominal pain, anal bleeding and blood in stool.  Genitourinary: Negative for dysuria and enuresis.  Musculoskeletal: Positive for back pain and myalgias. Negative for gait problem.  Skin: Negative for rash.  Neurological: Negative for dizziness and headaches.  Hematological: Negative for adenopathy. Does not bruise/bleed easily.  Psychiatric/Behavioral: Negative for behavioral problems, confusion and self-injury. The patient is not nervous/anxious.    Past Medical History:  Diagnosis Date  . Colitis, ulcerative ([place]) Dx'd 2001   Dr. Erlene Quan; [facility]--has been in remission since about 2009  . Diabetes mellitus without complication ([place]) 51 yrs old   type 2 (dx'd after long courses of prednisone for his UC)  . Erectile dysfunction   . Family history of tinea corporis 07/25/2012  . Hyperlipidemia   . Hypertension   . Insomnia   . Low back pain 08/07/2016  . Sinusitis 08/28/2015     Social History   Social History  . Marital status: Married    Spouse name: N/A  . Number of children: N/A  . Years of education: N/A     Occupational History  . Not on file.   Social History Main Topics  . Smoking status: Never Smoker  . Smokeless tobacco: Never Used  . Alcohol use No  . Drug use: No  . 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 Not on file   Social History Narrative   Married, 53 y/o girl.     Lives in [place], grew up in [place]--relocated to [facility] [place].   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          Past Surgical History:  Procedure Laterality Date  . TRIGGER FINGER RELEASE  2011   b/l middle fingers    Family History  Problem Relation Age of Onset  . Diabetes Father   . Hyperlipidemia Father   . Hypertension Father   . Kidney disease Father     Kidney Failure  . Heart disease Father 52    MI  . Hypertension Sister   . Hypertension Brother   . Diabetes Paternal Grandfather   . Hypertension Brother   . Hyperlipidemia Brother   . Heart disease Brother     Valve Surgery ?  . Diabetes Brother   . Hypertension Brother   . ADD / ADHD Brother     Allergies  Allergen Reactions  .  Simvastatin     Myalgias    Current Outpatient Prescriptions on File Prior to Visit  Medication Sig Dispense Refill  . albuterol (PROVENTIL HFA;VENTOLIN HFA) 108 (90 Base) MCG/ACT inhaler Inhale 2 puffs into the lungs every 6 (six) hours as needed for wheezing or shortness of breath. 1 Inhaler 0  . ALPRAZolam (XANAX) 0.5 MG tablet Take 1 tablet (0.5 mg total) by mouth 2 (two) times daily as needed for anxiety. 60 tablet 1  . azaTHIOprine (IMURAN) 50 MG tablet Take 4.5 tabs daily 135 tablet 5  . balsalazide (COLAZAL) 750 MG capsule Take 3 capsules (2,250 mg total) by mouth 3 (three) times daily. 270 capsule 1  . benzonatate (TESSALON) 100 MG capsule Take 1 capsule (100 mg total) by mouth 3 (three) times daily as needed. 30 capsule 0  .  busPIRone (BUSPAR) 5 MG tablet Take 5 mg by mouth 2 (two) times daily.    . Dulaglutide (TRULICITY) 1.5 DJ/4.9FW SOPN Inject 1.5 mg into the skin daily. Failed Victoza (Patient taking differently: Inject 1.5 mg into the skin once a week. Failed Victoza) 4 pen 5  . fenofibrate 160 MG tablet Take 1 tablet (160 mg total) by mouth daily. 30 tablet 6  . fluticasone (FLONASE) 50 MCG/ACT nasal spray Place 2 sprays into both nostrils daily. 16 g 1  . glimepiride (AMARYL) 4 MG tablet Take 1 tablet (4 mg total) by mouth 2 (two) times daily. 60 tablet 6  . glucose blood (ACCU-CHEK AVIVA PLUS) test strip Use twice daily to check blood sugar.  DX E11.9 100 each 4  . HYDROcodone-acetaminophen (NORCO) 5-325 MG tablet Take 1 tablet by mouth every 6 (six) hours as needed for moderate pain. 20 tablet 0  . Insulin Detemir (LEVEMIR) 100 UNIT/ML Pen Inject 40 to 50 units daily 15 mL 2  . Insulin Pen Needle (DROPLET PEN NEEDLES) 32G X 4 MM MISC Use 1x a day 100 each 11  . Lancets (ONETOUCH ULTRASOFT) lancets Use as directed twice daily to check blood sugar.  DX E11.9 100 each 6  . losartan (COZAAR) 50 MG tablet Take 1 tablet (50 mg total) by mouth daily. 30 tablet 6  . meloxicam (MOBIC) 15 MG tablet Take 15 mg by mouth daily.    . metFORMIN (GLUCOPHAGE) 500 MG tablet Take 2 tablets (1,000 mg total) by mouth 2 (two) times daily with a meal. 180 tablet 6  . metoprolol succinate (TOPROL-XL) 50 MG 24 hr tablet Take 1 tablet (50 mg total) by mouth daily. Take with or immediately following a meal. 30 tablet 6  . Pitavastatin Calcium (LIVALO) 4 MG TABS Take 1 tablet (4 mg total) by mouth daily. 30 tablet 6  . sildenafil (REVATIO) 20 MG tablet 1 tab po 2-5 tabs po as needed for sexual activity. 50 tablet 0  . tadalafil (CIALIS) 5 MG tablet Take 5 mg by mouth daily as needed.    . traMADol (ULTRAM) 50 MG tablet Take 1 tablet (50 mg total) by mouth every 8 (eight) hours as needed. 60 tablet 0  . valACYclovir (VALTREX) 1000 MG  tablet Take 1 tablet (1,000 mg total) by mouth 2 (two) times daily as needed. 30 tablet 6  . zaleplon (SONATA) 10 MG capsule Take 10 mg by mouth at bedtime as needed.      No current facility-administered medications on file prior to visit.     BP 122/72 (BP Location: Left Arm, Patient Position: Sitting, Cuff Size: Normal)   Pulse 96  Temp 98.8 F (37.1 C) (Oral)   Ht 5' 6"  (1.676 m)   Wt 184 lb 8 oz (83.7 kg)   SpO2 97%   BMI 29.78 kg/m      Objective:   Physical Exam  General  Mental Status - Alert. General Appearance - Well groomed. Not in acute distress.  Skin Rashes- No Rashes.  HEENT Head- Normal. Ear Auditory Canal - Left- Normal. Right - Normal.Tympanic Membrane- Left- Normal. Right- Normal. Eye Sclera/Conjunctiva- Left- Normal. Right- Normal. Nose & Sinuses Nasal Mucosa- Left-  Boggy and Congested. Right-  Boggy and  Congested.Bilateral maxillary and frontal sinus pressure. Mouth & Throat Lips: Upper Lip- Normal: no dryness, cracking, pallor, cyanosis, or vesicular eruption. Lower Lip-Normal: no dryness, cracking, pallor, cyanosis or vesicular eruption. Buccal Mucosa- Bilateral- No Aphthous ulcers. Oropharynx- No Discharge or Erythema. Tonsils: Characteristics- Bilateral- No Erythema or Congestion. Size/Enlargement- Bilateral- No enlargement. Discharge- bilateral-None.  Neck Neck- Supple. No Masses.   Chest and Lung Exam Auscultation: Breath Sounds:-Clear even and unlabored.  Cardiovascular Auscultation:Rythm- Regular, rate and rhythm. Murmurs & Other Heart Sounds:Ausculatation of the heart reveal- No Murmurs.  Lymphatic Head & Neck General Head & Neck Lymphatics: Bilateral: Description- No Localized lymphadenopathy.       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 Rx tamiflu.  For cough robitussin with codeine cough syrup.  For sinus infection early and maybe secondary following flu start azithromycin.  For  wheezing use albuterol. But if using albuterol frequently then start flovent as welll.  Follow up in 7 days or as needed  Nikisha Fleece, Percell Miller, [employer]

## 2016-12-17 MED FILL — TRULICITY 1.5 MG/0.5 ML PEN: 1.5 | 30 days supply | Qty: 2 | Fill #0

## 2016-12-31 ENCOUNTER — Ambulatory Visit (INDEPENDENT_AMBULATORY_CARE_PROVIDER_SITE_OTHER): Payer: BLUE CROSS/BLUE SHIELD | Admitting: Family Medicine

## 2016-12-31 ENCOUNTER — Encounter: Payer: Self-pay | Admitting: Family Medicine

## 2016-12-31 VITALS — BP 116/67 | HR 75 | Temp 97.8°F | Ht 66.0 in | Wt 187.6 lb

## 2016-12-31 DIAGNOSIS — IMO0001 Reserved for inherently not codable concepts without codable children: Secondary | ICD-10-CM

## 2016-12-31 DIAGNOSIS — K51819 Other ulcerative colitis with unspecified complications: Secondary | ICD-10-CM | POA: Diagnosis not present

## 2016-12-31 DIAGNOSIS — E1165 Type 2 diabetes mellitus with hyperglycemia: Secondary | ICD-10-CM

## 2016-12-31 DIAGNOSIS — Z0000 Encounter for general adult medical examination without abnormal findings: Secondary | ICD-10-CM

## 2016-12-31 DIAGNOSIS — E782 Mixed hyperlipidemia: Secondary | ICD-10-CM

## 2016-12-31 DIAGNOSIS — G8929 Other chronic pain: Secondary | ICD-10-CM

## 2016-12-31 DIAGNOSIS — I10 Essential (primary) hypertension: Secondary | ICD-10-CM | POA: Diagnosis not present

## 2016-12-31 DIAGNOSIS — M6530 Trigger finger, unspecified finger: Secondary | ICD-10-CM | POA: Insufficient documentation

## 2016-12-31 DIAGNOSIS — M25511 Pain in right shoulder: Secondary | ICD-10-CM

## 2016-12-31 DIAGNOSIS — M65342 Trigger finger, left ring finger: Secondary | ICD-10-CM | POA: Diagnosis not present

## 2016-12-31 DIAGNOSIS — M545 Low back pain: Secondary | ICD-10-CM

## 2016-12-31 HISTORY — DX: Pain in right shoulder: M25.511

## 2016-12-31 HISTORY — DX: Encounter for general adult medical examination without abnormal findings: Z00.00

## 2016-12-31 LAB — COMPREHENSIVE METABOLIC PANEL
ALT: 62 U/L — ABNORMAL HIGH (ref 0–53)
AST: 38 U/L — ABNORMAL HIGH (ref 0–37)
Albumin: 4.3 g/dL (ref 3.5–5.2)
Alkaline Phosphatase: 51 U/L (ref 39–117)
BUN: 10 mg/dL (ref 6–23)
CHLORIDE: 107 meq/L (ref 96–112)
CO2: 26 mEq/L (ref 19–32)
Calcium: 9.3 mg/dL (ref 8.4–10.5)
Creatinine, Ser: 0.84 mg/dL (ref 0.40–1.50)
GFR: 101.9 mL/min (ref >60.00)
GLUCOSE: 154 mg/dL — AB (ref 70–99)
POTASSIUM: 4.4 meq/L (ref 3.5–5.1)
SODIUM: 140 meq/L (ref 135–145)
Total Bilirubin: 0.8 mg/dL (ref 0.2–1.2)
Total Protein: 6.5 g/dL (ref 6.0–8.3)

## 2016-12-31 LAB — LIPID PANEL
Cholesterol: 108 mg/dL (ref 0–200)
HDL: 41.5 mg/dL (ref >39.00)
LDL CALC: 55 mg/dL (ref 0–99)
NONHDL: 66.29
Total CHOL/HDL Ratio: 3
Triglycerides: 54 mg/dL (ref 0.0–149.0)
VLDL: 10.8 mg/dL (ref 0.0–40.0)

## 2016-12-31 LAB — HEMOGLOBIN A1C: Hgb A1c MFr Bld: 8.2 % — ABNORMAL HIGH (ref 4.6–6.5)

## 2016-12-31 LAB — CBC
HEMATOCRIT: 37.7 % — AB (ref 39.0–52.0)
HEMOGLOBIN: 13.3 g/dL (ref 13.0–17.0)
MCHC: 35.2 g/dL (ref 30.0–36.0)
MCV: 94.6 fl (ref 78.0–100.0)
Platelets: 214 10*3/uL (ref 150.0–400.0)
RBC: 3.99 Mil/uL — ABNORMAL LOW (ref 4.22–5.81)
RDW: 14.8 % (ref 11.5–15.5)
WBC: 3.5 10*3/uL — AB (ref 4.0–10.5)

## 2016-12-31 LAB — PSA: PSA: 0.58 ng/mL (ref 0.10–4.00)

## 2016-12-31 LAB — TSH: TSH: 2.27 u[IU]/mL (ref 0.35–4.50)

## 2016-12-31 MED ORDER — TIZANIDINE HCL 4 MG PO TABS: 4.0000 mg | ORAL_TABLET | Freq: Four times a day (QID) | ORAL | 0 refills | Status: DC | PRN

## 2016-12-31 MED ORDER — ACETAMINOPHEN-CODEINE #3 300-30 MG PO TABS: 1.0000 | ORAL_TABLET | Freq: Three times a day (TID) | ORAL | 0 refills | Status: DC | PRN

## 2016-12-31 NOTE — Assessment & Plan Note (Signed)
Tolerating statin, encouraged heart healthy diet, avoid trans fats, minimize simple carbs and saturated fats. Increase exercise as tolerated 

## 2016-12-31 NOTE — Assessment & Plan Note (Signed)
Has flared again with lifting heavy couch. Will refer to PT in OR given Tizanidine to try 2-4 mg prn and a refill on T and C #3, Tramadol caused too much sedation. Agrees to contract and UDS

## 2016-12-31 NOTE — Assessment & Plan Note (Signed)
Has an appt with his ortho in [place] in April will discuss managing the shoulder vs the hand first

## 2016-12-31 NOTE — Patient Instructions (Signed)
Preventive Care 40-64 Years, Male Preventive care refers to lifestyle choices and visits with your health care provider that can promote health and wellness. What does preventive care include?  A yearly physical exam. This is also called an annual well check.  Dental exams once or twice a year.  Routine eye exams. Ask your health care provider how often you should have your eyes checked.  Personal lifestyle choices, including:  Daily care of your teeth and gums.  Regular physical activity.  Eating a healthy diet.  Avoiding tobacco and drug use.  Limiting alcohol use.  Practicing safe sex.  Taking low-dose aspirin every day starting at age 50. What happens during an annual well check? The services and screenings done by your health care provider during your annual well check will depend on your age, overall health, lifestyle risk factors, and family history of disease. Counseling  Your health care provider may ask you questions about your:  Alcohol use.  Tobacco use.  Drug use.  Emotional well-being.  Home and relationship well-being.  Sexual activity.  Eating habits.  Work and work environment. Screening  You may have the following tests or measurements:  Height, weight, and BMI.  Blood pressure.  Lipid and cholesterol levels. These may be checked every 5 years, or more frequently if you are over 50 years old.  Skin check.  Lung cancer screening. You may have this screening every year starting at age 55 if you have a 30-pack-year history of smoking and currently smoke or have quit within the past 15 years.  Fecal occult blood test (FOBT) of the stool. You may have this test every year starting at age 50.  Flexible sigmoidoscopy or colonoscopy. You may have a sigmoidoscopy every 5 years or a colonoscopy every 10 years starting at age 50.  Prostate cancer screening. Recommendations will vary depending on your family history and other risks.  Hepatitis C  blood test.  Hepatitis B blood test.  Sexually transmitted disease (STD) testing.  Diabetes screening. This is done by checking your blood sugar (glucose) after you have not eaten for a while (fasting). You may have this done every 1-3 years. Discuss your test results, treatment options, and if necessary, the need for more tests with your health care provider. Vaccines  Your health care provider may recommend certain vaccines, such as:  Influenza vaccine. This is recommended every year.  Tetanus, diphtheria, and acellular pertussis (Tdap, Td) vaccine. You may need a Td booster every 10 years.  Varicella vaccine. You may need this if you have not been vaccinated.  Zoster vaccine. You may need this after age 60.  Measles, mumps, and rubella (MMR) vaccine. You may need at least one dose of MMR if you were born in 1957 or later. You may also need a second dose.  Pneumococcal 13-valent conjugate (PCV13) vaccine. You may need this if you have certain conditions and have not been vaccinated.  Pneumococcal polysaccharide (PPSV23) vaccine. You may need one or two doses if you smoke cigarettes or if you have certain conditions.  Meningococcal vaccine. You may need this if you have certain conditions.  Hepatitis A vaccine. You may need this if you have certain conditions or if you travel or work in places where you may be exposed to hepatitis A.  Hepatitis B vaccine. You may need this if you have certain conditions or if you travel or work in places where you may be exposed to hepatitis B.  Haemophilus influenzae type b (Hib)   vaccine. You may need this if you have certain risk factors. Talk to your health care provider about which screenings and vaccines you need and how often you need them. This information is not intended to replace advice given to you by your health care provider. Make sure you discuss any questions you have with your health care provider. Document Released: 10/28/2015  Document Revised: 06/20/2016 Document Reviewed: 08/02/2015 Elsevier Interactive Patient Education  2017 [employer].

## 2016-12-31 NOTE — Assessment & Plan Note (Signed)
Patient encouraged to maintain heart healthy diet, regular exercise, adequate sleep. Consider daily probiotics. Take medications as prescribed. Given and reviewed copy of ACP documents from [place] and encouraged to complete and return 

## 2016-12-31 NOTE — Assessment & Plan Note (Signed)
Sugars are improving on insulin 46 units, Levemir. Notes increased fatigue as his sugars are getting under control.his sugars are now below 150 and above 70. Lowest number in past month 68 and highest at 180. Averaging 100 to 130. Check hgba1c today

## 2016-12-31 NOTE — Progress Notes (Signed)
Pre visit review using our clinic review tool, if applicable. No additional management support is needed unless otherwise documented below in the visit note. 

## 2016-12-31 NOTE — Progress Notes (Signed)
Patient ID: Curtis Kennedy, male   DOB: 1965-11-30, 51 y.o.   MRN: [id number]   Subjective:    Patient ID: Curtis Kennedy, male    DOB: 1966-01-04, 50 y.o.   MRN: [id number]  Chief Complaint  Patient presents with  . Annual Exam  . Hypertension  . Hyperlipidemia    HPI Patient is in today for annual preventative check up and follow up on chronic medical concerns including DM, hyperlipidemia, chronic pain including low back pain. He feels well today. His blood sugars are improving, he notes in last few weeks lowest sugar was 68 and highest was 180 but most numbers hanging between 100 and 130. His levemir is up to 46 and he is trying to monimize simple carbs and stay active. No polyuria or polydipsia. No recent febrile illness or hospitalization. He hurt his low back again about 9 days ago while trying to move an oversized couch. No radicular symptoms or incontinence but pain is persistent despite some nsaids. Denies CP/palp/SOB/HA/congestion/fevers/GI or GU c/o. Taking meds as prescribed  Past Medical History:  Diagnosis Date  . Colitis, ulcerative ([place]) Dx'd 2001   Dr. Erlene Quan; [facility]--has been in remission since about 2009  . Diabetes mellitus without complication ([place]) 51 yrs old   type 2 (dx'd after long courses of prednisone for his UC)  . Erectile dysfunction   . Family history of tinea corporis 07/25/2012  . Hyperlipidemia   . Hypertension   . Insomnia   . Low back pain 08/07/2016  . Preventative health care 12/31/2016  . Right shoulder pain 12/31/2016  . Sinusitis 08/28/2015    Past Surgical History:  Procedure Laterality Date  . TRIGGER FINGER RELEASE  2011   b/l middle fingers    Family History  Problem Relation Age of Onset  . Diabetes Father   . Hyperlipidemia Father   . Hypertension Father   . Kidney disease Father     Kidney Failure  . Heart disease Father 58    MI  . Hypertension Sister   . Hypertension Brother   . Diabetes Paternal Grandfather     . Hypertension Brother   . Hyperlipidemia Brother   . Heart disease Brother     Valve Surgery ?  . Diabetes Brother   . Hypertension Brother   . ADD / ADHD Brother     Social History   Social History  . Marital status: Married    Spouse name: N/A  . Number of children: N/A  . Years of education: N/A   Occupational History  . Not on file.   Social History Main Topics  . Smoking status: Never Smoker  . Smokeless tobacco: Never Used  . Alcohol use No  . Drug use: No  . 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 Not on file   Social History Narrative   Married, 53 y/o girl.     Lives in [place], grew up in [place]--relocated to [facility] 2007.   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          Outpatient Medications Prior to Visit  Medication Sig Dispense Refill  . albuterol (PROVENTIL HFA;VENTOLIN HFA) 108 (90 Base) MCG/ACT inhaler Inhale 2 puffs into the lungs every 6 (six) hours as needed for wheezing or shortness  of breath. 1 Inhaler 0  . ALPRAZolam (XANAX) 0.5 MG tablet Take 1 tablet (0.5 mg total) by mouth 2 (two) times daily as needed for anxiety. 60 tablet 1  . azaTHIOprine (IMURAN) 50 MG tablet Take 4.5 tabs daily 135 tablet 5  . balsalazide (COLAZAL) 750 MG capsule Take 3 capsules (2,250 mg total) by mouth 3 (three) times daily. 270 capsule 1  . benzonatate (TESSALON) 100 MG capsule Take 1 capsule (100 mg total) by mouth 3 (three) times daily as needed. 30 capsule 0  . busPIRone (BUSPAR) 5 MG tablet Take 5 mg by mouth 2 (two) times daily.    . Dulaglutide (TRULICITY) 1.5 FF/6.3WG SOPN Inject 1.5 mg into the skin daily. Failed Victoza (Patient taking differently: Inject 1.5 mg into the skin once a week. Failed Victoza) 4 pen 5  . fenofibrate 160 MG tablet Take 1 tablet (160 mg total) by mouth daily. 30 tablet 6   . fluticasone (FLONASE) 50 MCG/ACT nasal spray Place 2 sprays into both nostrils daily. 16 g 1  . fluticasone (FLOVENT HFA) 110 MCG/ACT inhaler Inhale 2 puffs into the lungs 2 (two) times daily. 1 Inhaler 2  . fluticasone (FLOVENT HFA) 110 MCG/ACT inhaler Inhale 2 puffs into the lungs 2 (two) times daily. 1 Inhaler 2  . glimepiride (AMARYL) 4 MG tablet Take 1 tablet (4 mg total) by mouth 2 (two) times daily. 60 tablet 6  . glucose blood (ACCU-CHEK AVIVA PLUS) test strip Use twice daily to check blood sugar.  DX E11.9 100 each 4  . guaiFENesin-codeine (ROBITUSSIN AC) 100-10 MG/5ML syrup Take 5 mLs by mouth 3 (three) times daily as needed for cough. 120 mL 0  . Insulin Detemir (LEVEMIR) 100 UNIT/ML Pen Inject 40 to 50 units daily 15 mL 2  . Insulin Pen Needle (DROPLET PEN NEEDLES) 32G X 4 MM MISC Use 1x a day 100 each 11  . Lancets (ONETOUCH ULTRASOFT) lancets Use as directed twice daily to check blood sugar.  DX E11.9 100 each 6  . losartan (COZAAR) 50 MG tablet Take 1 tablet (50 mg total) by mouth daily. 30 tablet 6  . meloxicam (MOBIC) 15 MG tablet Take 15 mg by mouth daily.    . metFORMIN (GLUCOPHAGE) 500 MG tablet Take 2 tablets (1,000 mg total) by mouth 2 (two) times daily with a meal. 180 tablet 6  . metoprolol succinate (TOPROL-XL) 50 MG 24 hr tablet Take 1 tablet (50 mg total) by mouth daily. Take with or immediately following a meal. 30 tablet 6  . Pitavastatin Calcium (LIVALO) 4 MG TABS Take 1 tablet (4 mg total) by mouth daily. 30 tablet 6  . sildenafil (REVATIO) 20 MG tablet 1 tab po 2-5 tabs po as needed for sexual activity. 50 tablet 0  . tadalafil (CIALIS) 5 MG tablet Take 5 mg by mouth daily as needed.    . valACYclovir (VALTREX) 1000 MG tablet Take 1 tablet (1,000 mg total) by mouth 2 (two) times daily as needed. 30 tablet 6  . zaleplon (SONATA) 10 MG capsule Take 10 mg by mouth at bedtime as needed.     Marland Kitchen azithromycin (ZITHROMAX) 250 MG tablet Take 2 tablets by mouth on day 1,  followed by 1 tablet by mouth daily for 4 days. (Patient not taking: Reported on 12/31/2016) 6 tablet 0  . oseltamivir (TAMIFLU) 75 MG capsule Take 1 capsule (75 mg total) by mouth 2 (two) times daily. (Patient not taking: Reported on 12/31/2016) 10 capsule 0  No facility-administered medications prior to visit.     Allergies  Allergen Reactions  . Simvastatin     Myalgias    Review of Systems  Constitutional: Positive for malaise/fatigue. Negative for chills and fever.  HENT: Negative for congestion and hearing loss.   Eyes: Negative for discharge.  Respiratory: Negative for cough, sputum production and shortness of breath.   Cardiovascular: Negative for chest pain, palpitations and leg swelling.  Gastrointestinal: Negative for abdominal pain, blood in stool, constipation, diarrhea, heartburn, nausea and vomiting.  Genitourinary: Negative for dysuria, frequency, hematuria and urgency.  Musculoskeletal: Positive for back pain and joint pain. Negative for falls and myalgias.  Skin: Negative for rash.  Neurological: Negative for dizziness, sensory change, loss of consciousness, weakness and headaches.  Endo/Heme/Allergies: Negative for environmental allergies. Does not bruise/bleed easily.  Psychiatric/Behavioral: Negative for depression and suicidal ideas. The patient is not nervous/anxious and does not have insomnia.        Objective:    Physical Exam  Constitutional: He is oriented to person, place, and time. He appears well-developed and well-nourished. No distress.  HENT:  Head: Normocephalic and atraumatic.  Eyes: Conjunctivae are normal.  Neck: Neck supple. No thyromegaly present.  Cardiovascular: Normal rate, regular rhythm and normal heart sounds.   No murmur heard. Pulmonary/Chest: Effort normal and breath sounds normal. No respiratory distress. He has no wheezes.  Abdominal: Soft. Bowel sounds are normal. He exhibits no mass. There is no tenderness. There is no rebound  and no guarding.  Musculoskeletal: He exhibits no edema.  Lymphadenopathy:    He has no cervical adenopathy.  Neurological: He is alert and oriented to person, place, and time.  Skin: Skin is warm and dry.  Psychiatric: He has a normal mood and affect. His behavior is normal.    BP 116/67 (BP Location: Left Arm, Patient Position: Sitting, Cuff Size: Large)   Pulse 75   Temp 97.8 F (36.6 C) (Oral)   Ht 5' 6"  (1.676 m)   Wt 187 lb 9.6 oz (85.1 kg)   SpO2 100% Comment: RA  BMI 30.28 kg/m  Wt Readings from Last 3 Encounters:  12/31/16 187 lb 9.6 oz (85.1 kg)  12/07/16 184 lb 8 oz (83.7 kg)  10/02/16 182 lb 4 oz (82.7 kg)     Lab Results  Component Value Date   WBC 3.9 (L) 10/02/2016   HGB 14.0 10/02/2016   HCT 40.9 10/02/2016   PLT 281.0 10/02/2016   GLUCOSE 406 (H) 10/02/2016   CHOL 132 10/02/2016   TRIG 134.0 10/02/2016   HDL 41.50 10/02/2016   LDLCALC 64 10/02/2016   ALT 45 10/02/2016   AST 32 10/02/2016   NA 136 10/02/2016   K 4.2 10/02/2016   CL 102 10/02/2016   CREATININE 0.88 10/02/2016   BUN 12 10/02/2016   CO2 24 10/02/2016   TSH 3.04 10/02/2016   PSA 0.87 04/22/2015   HGBA1C 8.0 (H) 10/02/2016   MICROALBUR 7.3 (H) 11/24/2015    Lab Results  Component Value Date   TSH 3.04 10/02/2016   Lab Results  Component Value Date   WBC 3.9 (L) 10/02/2016   HGB 14.0 10/02/2016   HCT 40.9 10/02/2016   MCV 95.3 10/02/2016   PLT 281.0 10/02/2016   Lab Results  Component Value Date   NA 136 10/02/2016   K 4.2 10/02/2016   CO2 24 10/02/2016   GLUCOSE 406 (H) 10/02/2016   BUN 12 10/02/2016   CREATININE 0.88 10/02/2016   BILITOT  0.7 10/02/2016   ALKPHOS 78 10/02/2016   AST 32 10/02/2016   ALT 45 10/02/2016   PROT 7.4 10/02/2016   ALBUMIN 4.6 10/02/2016   CALCIUM 9.5 10/02/2016   GFR 96.67 10/02/2016   Lab Results  Component Value Date   CHOL 132 10/02/2016   Lab Results  Component Value Date   HDL 41.50 10/02/2016   Lab Results  Component  Value Date   LDLCALC 64 10/02/2016   Lab Results  Component Value Date   TRIG 134.0 10/02/2016   Lab Results  Component Value Date   CHOLHDL 3 10/02/2016   Lab Results  Component Value Date   HGBA1C 8.0 (H) 10/02/2016       Assessment & Plan:   Problem List Items Addressed This Visit    Hypertension    Well controlled, no changes to meds. Encouraged heart healthy diet such as the DASH diet and exercise as tolerated.       Relevant Orders   CBC   Comprehensive metabolic panel   TSH   Hyperlipidemia    Tolerating statin, encouraged heart healthy diet, avoid trans fats, minimize simple carbs and saturated fats. Increase exercise as tolerated      Relevant Orders   Lipid panel   Uncontrolled type 2 diabetes mellitus without complication, without long-term current use of insulin (HCC)    Sugars are improving on insulin 46 units, Levemir. Notes increased fatigue as his sugars are getting under control.his sugars are now below 150 and above 70. Lowest number in past month 68 and highest at 180. Averaging 100 to 130. Check hgba1c today      Relevant Orders   Hemoglobin A1c   Low back pain    Has flared again with lifting heavy couch. Will refer to PT in OR given Tizanidine to try 2-4 mg prn and a refill on T and C #3, Tramadol caused too much sedation. Agrees to contract and UDS      Relevant Medications   acetaminophen-codeine (TYLENOL #3) 300-30 MG tablet   tiZANidine (ZANAFLEX) 4 MG tablet   Right shoulder pain   Trigger finger of left hand    Has an appt with his ortho in [place] in April will discuss managing the shoulder vs the hand first      Preventative health care    Patient encouraged to maintain heart healthy diet, regular exercise, adequate sleep. Consider daily probiotics. Take medications as prescribed. Given and reviewed copy of ACP documents from [employer] and encouraged to complete and return         I have discontinued Mr. Tennis Must Kennedy's  oseltamivir and azithromycin. I am also having him start on acetaminophen-codeine and tiZANidine. Additionally, I am having him maintain his meloxicam, tadalafil, zaleplon, busPIRone, metoprolol succinate, fluticasone, azaTHIOprine, ALPRAZolam, sildenafil, Dulaglutide, benzonatate, Insulin Pen Needle, fenofibrate, losartan, Pitavastatin Calcium, metFORMIN, glimepiride, onetouch ultrasoft, glucose blood, valACYclovir, balsalazide, Insulin Detemir, guaiFENesin-codeine, fluticasone, albuterol, and fluticasone.  Meds ordered this encounter  Medications  . acetaminophen-codeine (TYLENOL #3) 300-30 MG tablet    Sig: Take 1-2 tablets by mouth every 8 (eight) hours as needed for moderate pain.    Dispense:  30 tablet    Refill:  0  . tiZANidine (ZANAFLEX) 4 MG tablet    Sig: Take 1 tablet (4 mg total) by mouth every 6 (six) hours as needed for muscle spasms.    Dispense:  30 tablet    Refill:  0    Penni Homans, MD

## 2016-12-31 NOTE — Assessment & Plan Note (Signed)
Well controlled, no changes to meds. Encouraged heart healthy diet such as the DASH diet and exercise as tolerated.  °

## 2017-01-03 MED FILL — LEVEMIR FLEXTOUCH 100 UNITS: 100 | 30 days supply | Qty: 15 | Fill #2

## 2017-01-09 ENCOUNTER — Encounter: Payer: Self-pay | Admitting: Family Medicine

## 2017-01-10 ENCOUNTER — Other Ambulatory Visit: Payer: Self-pay | Admitting: Family Medicine

## 2017-01-10 DIAGNOSIS — IMO0001 Reserved for inherently not codable concepts without codable children: Secondary | ICD-10-CM

## 2017-01-10 DIAGNOSIS — E785 Hyperlipidemia, unspecified: Secondary | ICD-10-CM

## 2017-01-10 DIAGNOSIS — E1165 Type 2 diabetes mellitus with hyperglycemia: Secondary | ICD-10-CM

## 2017-01-10 DIAGNOSIS — I10 Essential (primary) hypertension: Secondary | ICD-10-CM

## 2017-01-10 MED ORDER — METOPROLOL SUCCINATE ER 50 MG PO TB24: 50.0000 mg | ORAL_TABLET | Freq: Every day | ORAL | 6 refills | Status: DC

## 2017-01-21 ENCOUNTER — Encounter: Payer: Self-pay | Admitting: Family Medicine

## 2017-01-21 DIAGNOSIS — E1165 Type 2 diabetes mellitus with hyperglycemia: Secondary | ICD-10-CM

## 2017-01-21 DIAGNOSIS — IMO0001 Reserved for inherently not codable concepts without codable children: Secondary | ICD-10-CM

## 2017-01-21 DIAGNOSIS — I10 Essential (primary) hypertension: Secondary | ICD-10-CM

## 2017-01-21 DIAGNOSIS — E785 Hyperlipidemia, unspecified: Secondary | ICD-10-CM

## 2017-01-21 MED ORDER — AZATHIOPRINE 50 MG PO TABS: ORAL_TABLET | ORAL | 5 refills | Status: DC

## 2017-01-21 MED FILL — TRULICITY 1.5 MG/0.5 ML PEN: 1.5 | 30 days supply | Qty: 2 | Fill #1

## 2017-01-30 ENCOUNTER — Other Ambulatory Visit: Payer: Self-pay | Admitting: Family Medicine

## 2017-01-30 MED FILL — LEVEMIR FLEXTOUCH 100 UNITS: 100 | 30 days supply | Qty: 15 | Fill #0

## 2017-02-15 MED FILL — TRULICITY 1.5 MG/0.5 ML PEN: 1.5 | 30 days supply | Qty: 2 | Fill #2

## 2017-02-18 ENCOUNTER — Telehealth: Payer: Self-pay | Admitting: Family Medicine

## 2017-02-18 DIAGNOSIS — IMO0001 Reserved for inherently not codable concepts without codable children: Secondary | ICD-10-CM

## 2017-02-18 DIAGNOSIS — E785 Hyperlipidemia, unspecified: Secondary | ICD-10-CM

## 2017-02-18 DIAGNOSIS — E1165 Type 2 diabetes mellitus with hyperglycemia: Secondary | ICD-10-CM

## 2017-02-18 DIAGNOSIS — I10 Essential (primary) hypertension: Secondary | ICD-10-CM

## 2017-02-18 MED ORDER — BALSALAZIDE DISODIUM 750 MG PO CAPS: 2250.0000 mg | ORAL_CAPSULE | Freq: Three times a day (TID) | ORAL | 1 refills | Status: DC

## 2017-02-18 NOTE — Telephone Encounter (Signed)
Refill done and patient informed

## 2017-02-18 NOTE — Telephone Encounter (Signed)
Ok to refill with same sig, same number

## 2017-02-18 NOTE — Telephone Encounter (Signed)
Caller name: Malakye Relationship to patient: self Can be reached: [phone] Pharmacy: [facility] [place], [place]  Reason for call: pt needing refill on balsalazide. He takes 3 capsules 3xday. He only has 3 left. Pt tried to refill request on MyChart but was not able to get in system was down. Pt requesting it sent in today. Please call or mychart msg him to let him know it is sent in.

## 2017-03-04 ENCOUNTER — Encounter: Payer: Self-pay | Admitting: Family Medicine

## 2017-03-12 MED FILL — LEVEMIR FLEXTOUCH 100 UNITS: 100 | 30 days supply | Qty: 15 | Fill #1

## 2017-03-15 ENCOUNTER — Other Ambulatory Visit: Payer: Self-pay | Admitting: Family Medicine

## 2017-03-15 MED ORDER — DULAGLUTIDE 1.5 MG/0.5ML ~~LOC~~ SOAJ: 1.5000 mg | SUBCUTANEOUS | 3 refills | Status: DC

## 2017-03-15 MED FILL — TRULICITY 1.5 MG/0.5 ML PEN: 1.5 | 30 days supply | Qty: 2 | Fill #3

## 2017-03-18 ENCOUNTER — Ambulatory Visit (INDEPENDENT_AMBULATORY_CARE_PROVIDER_SITE_OTHER): Payer: BLUE CROSS/BLUE SHIELD | Admitting: Medical

## 2017-03-18 ENCOUNTER — Encounter: Payer: Self-pay | Admitting: Medical

## 2017-03-18 VITALS — BP 131/78 | HR 96 | Temp 97.7°F | Resp 16 | Ht 66.0 in | Wt 182.4 lb

## 2017-03-18 DIAGNOSIS — J301 Allergic rhinitis due to pollen: Secondary | ICD-10-CM

## 2017-03-18 MED ORDER — LEVOCETIRIZINE DIHYDROCHLORIDE 5 MG PO TABS: 5.0000 mg | ORAL_TABLET | Freq: Every evening | ORAL | 1 refills | Status: DC

## 2017-03-18 MED ORDER — OLOPATADINE HCL 0.1 % OP SOLN: 1.0000 [drp] | Freq: Two times a day (BID) | OPHTHALMIC | 3 refills | Status: DC

## 2017-03-18 NOTE — Progress Notes (Signed)
Subjective:    Patient ID: Curtis Kennedy, male    DOB: 1966-05-13, 51 y.o.   MRN: [id number]  HPI  Pt in for evaluation.   Pt states he in past will get allergy signs and symptoms periodically. Often 3 days after mowing the grass moderate symptoms. He wants to be proactive. He states allegra over past years helped but recently not helping much. He tried other otc allergy meds. Pt has used nasal sprays in the past but he does not like the taste.    Review of Systems  Constitutional: Negative for chills, fatigue and fever.       No current symptoms presently but he wants something better.  HENT: Positive for congestion, postnasal drip and rhinorrhea. Negative for sinus pressure, sneezing and sore throat.   Eyes: Positive for redness.  Respiratory: Negative for cough, chest tightness, shortness of breath and wheezing.   Cardiovascular: Negative for palpitations.  Gastrointestinal: Negative for abdominal pain and blood in stool.  Musculoskeletal: Negative for back pain.  Skin: Negative for rash.  Neurological: Negative for dizziness, speech difficulty and light-headedness.  Hematological: Negative for adenopathy. Does not bruise/bleed easily.  Psychiatric/Behavioral: Negative for behavioral problems and confusion.    Past Medical History:  Diagnosis Date  . Colitis, ulcerative ([place]) Dx'd 2001   Dr. Erlene Quan; [facility]--has been in remission since about 2009  . Diabetes mellitus without complication ([place]) 51 yrs old   type 2 (dx'd after long courses of prednisone for his UC)  . Erectile dysfunction   . Family history of tinea corporis 07/25/2012  . Hyperlipidemia   . Hypertension   . Insomnia   . Low back pain 08/07/2016  . Preventative health care 12/31/2016  . Right shoulder pain 12/31/2016  . Sinusitis 08/28/2015     Social History   Social History  . Marital status: Married    Spouse name: N/A  . Number of children: N/A  . Years of education: N/A   Occupational  History  . Not on file.   Social History Main Topics  . Smoking status: Never Smoker  . Smokeless tobacco: Never Used  . Alcohol use No  . Drug use: No  . 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 Not on file   Social History Narrative   Married, 53 y/o girl.     Lives in [place], grew up in [place]--relocated to [facility] 2007.   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          Past Surgical History:  Procedure Laterality Date  . TRIGGER FINGER RELEASE  2011   b/l middle fingers    Family History  Problem Relation Age of Onset  . Diabetes Father   . Hyperlipidemia Father   . Hypertension Father   . Kidney disease Father        Kidney Failure  . Heart disease Father 57       MI  . Hypertension Sister   . Hypertension Brother   . Diabetes Paternal Grandfather   . Hypertension Brother   . Hyperlipidemia Brother   . Heart disease Brother        Valve Surgery ?  . Diabetes Brother   . Hypertension Brother   . ADD / ADHD Brother     Allergies  Allergen Reactions  . Simvastatin     Myalgias    Current Outpatient Prescriptions on File Prior to Visit  Medication Sig Dispense Refill  . acetaminophen-codeine (TYLENOL #3) 300-30 MG tablet Take 1-2 tablets by mouth every 8 (eight) hours as needed for moderate pain. 30 tablet 0  . albuterol (PROVENTIL HFA;VENTOLIN HFA) 108 (90 Base) MCG/ACT inhaler Inhale 2 puffs into the lungs every 6 (six) hours as needed for wheezing or shortness of breath. 1 Inhaler 0  . ALPRAZolam (XANAX) 0.5 MG tablet Take 1 tablet (0.5 mg total) by mouth 2 (two) times daily as needed for anxiety. 60 tablet 1  . azaTHIOprine (IMURAN) 50 MG tablet Take 4.5 tabs daily 135 tablet 5  . balsalazide (COLAZAL) 750 MG capsule Take 3 capsules (2,250 mg total) by mouth 3 (three) times daily. 270 capsule 1    . busPIRone (BUSPAR) 5 MG tablet Take 5 mg by mouth 2 (two) times daily.    . Dulaglutide (TRULICITY) 1.5 HF/0.2OV SOPN Inject 1.5 mg into the skin once a week. Failed Victoza 4 pen 3  . fenofibrate 160 MG tablet Take 1 tablet (160 mg total) by mouth daily. 30 tablet 6  . fluticasone (FLONASE) 50 MCG/ACT nasal spray Place 2 sprays into both nostrils daily. 16 g 1  . fluticasone (FLOVENT HFA) 110 MCG/ACT inhaler Inhale 2 puffs into the lungs 2 (two) times daily. 1 Inhaler 2  . fluticasone (FLOVENT HFA) 110 MCG/ACT inhaler Inhale 2 puffs into the lungs 2 (two) times daily. 1 Inhaler 2  . glimepiride (AMARYL) 4 MG tablet Take 1 tablet (4 mg total) by mouth 2 (two) times daily. 60 tablet 6  . glucose blood (ACCU-CHEK AVIVA PLUS) test strip Use twice daily to check blood sugar.  DX E11.9 100 each 4  . Insulin Pen Needle (DROPLET PEN NEEDLES) 32G X 4 MM MISC Use 1x a day 100 each 11  . Lancets (ONETOUCH ULTRASOFT) lancets Use as directed twice daily to check blood sugar.  DX E11.9 100 each 6  . LEVEMIR FLEXTOUCH 100 UNIT/ML Pen INJECT 40 TO 50 UNITS DAILY 15 mL 2  . losartan (COZAAR) 50 MG tablet Take 1 tablet (50 mg total) by mouth daily. 30 tablet 6  . meloxicam (MOBIC) 15 MG tablet Take 15 mg by mouth daily.    . metFORMIN (GLUCOPHAGE) 500 MG tablet Take 2 tablets (1,000 mg total) by mouth 2 (two) times daily with a meal. 180 tablet 6  . metoprolol succinate (TOPROL-XL) 50 MG 24 hr tablet Take 1 tablet (50 mg total) by mouth daily. Take with or immediately following a meal. 30 tablet 6  . Pitavastatin Calcium (LIVALO) 4 MG TABS Take 1 tablet (4 mg total) by mouth daily. 30 tablet 6  . sildenafil (REVATIO) 20 MG tablet 1 tab po 2-5 tabs po as needed for sexual activity. 50 tablet 0  . tadalafil (CIALIS) 5 MG tablet Take 5 mg by mouth daily as needed.    Marland Kitchen tiZANidine (ZANAFLEX) 4 MG tablet Take 1 tablet (4 mg total) by mouth every 6 (six) hours as needed for muscle spasms. 30 tablet 0  . valACYclovir  (VALTREX) 1000 MG tablet Take 1 tablet (1,000 mg total) by mouth 2 (two) times daily as needed. 30 tablet 6   No current facility-administered medications on file prior to visit.     BP 131/78 (BP Location: Right Arm, Patient Position: Sitting, Cuff Size: Normal)   Pulse 96   Temp 97.7 F (36.5  C) (Oral)   Resp 16   Ht 5' 6"  (1.676 m)   Wt 182 lb 6.4 oz (82.7 kg)   SpO2 99%   BMI 29.44 kg/m       Objective:   Physical Exam  General  Mental Status - Alert. General Appearance - Well groomed. Not in acute distress.  Skin Rashes- No Rashes.  HEENT Head- Normal. Ear Auditory Canal - Left- Normal. Right - Normal.Tympanic Membrane- Left- Normal. Right- Normal. Eye Sclera/Conjunctiva- Left- Normal. Right- Normal. Nose & Sinuses Nasal Mucosa- Left-  Boggy and Congested. Right-  Boggy and  Congested.Bilateral very faint maxillary but no frontal sinus pressure.(did not report this pressure until found on exam) Mouth & Throat Lips: Upper Lip- Normal: no dryness, cracking, pallor, cyanosis, or vesicular eruption. Lower Lip-Normal: no dryness, cracking, pallor, cyanosis or vesicular eruption. Buccal Mucosa- Bilateral- No Aphthous ulcers. Oropharynx- No Discharge or Erythema. Tonsils: Characteristics- Bilateral- No Erythema or Congestion. Size/Enlargement- Bilateral- No enlargement. Discharge- bilateral-None.  Neck Neck- Supple. No Masses.   Chest and Lung Exam Auscultation: Breath Sounds:-Clear even and unlabored.  Cardiovascular Auscultation:Rythm- Regular, rate and rhythm. Murmurs & Other Heart Sounds:Ausculatation of the heart reveal- No Murmurs.  Lymphatic Head & Neck General Head & Neck Lymphatics: Bilateral: Description- No Localized lymphadenopathy.      Assessment & Plan:  For hx of allergic rhinitis with desired better control will rx xyzal and will make Patanol eye drops available.   If this is not adequate then can add med such as montelukast.  Follow up  as needed(please up date [place] if new med regimen not adequate.)    Aquinnah Devin, Percell Miller, PA-C

## 2017-03-18 NOTE — Patient Instructions (Addendum)
For hx of allergic rhinitis with desired better control will rx xyzal and will make Patanol eye drops available.   If this is not adequate then can add med such as montelukast.  Follow up as needed(please up date [place] if new med regimen not adequate.)

## 2017-04-02 ENCOUNTER — Ambulatory Visit (INDEPENDENT_AMBULATORY_CARE_PROVIDER_SITE_OTHER): Payer: BLUE CROSS/BLUE SHIELD | Admitting: Family Medicine

## 2017-04-02 ENCOUNTER — Encounter: Payer: Self-pay | Admitting: Family Medicine

## 2017-04-02 DIAGNOSIS — I10 Essential (primary) hypertension: Secondary | ICD-10-CM | POA: Diagnosis not present

## 2017-04-02 DIAGNOSIS — E785 Hyperlipidemia, unspecified: Secondary | ICD-10-CM

## 2017-04-02 DIAGNOSIS — E784 Other hyperlipidemia: Secondary | ICD-10-CM

## 2017-04-02 DIAGNOSIS — Z0000 Encounter for general adult medical examination without abnormal findings: Secondary | ICD-10-CM

## 2017-04-02 DIAGNOSIS — E7849 Other hyperlipidemia: Secondary | ICD-10-CM

## 2017-04-02 DIAGNOSIS — IMO0001 Reserved for inherently not codable concepts without codable children: Secondary | ICD-10-CM

## 2017-04-02 DIAGNOSIS — K5180 Other ulcerative colitis without complications: Secondary | ICD-10-CM

## 2017-04-02 DIAGNOSIS — Z23 Encounter for immunization: Secondary | ICD-10-CM

## 2017-04-02 DIAGNOSIS — E1165 Type 2 diabetes mellitus with hyperglycemia: Secondary | ICD-10-CM | POA: Diagnosis not present

## 2017-04-02 DIAGNOSIS — M65342 Trigger finger, left ring finger: Secondary | ICD-10-CM

## 2017-04-02 LAB — LIPID PANEL
CHOLESTEROL: 123 mg/dL (ref 0–200)
HDL: 35.1 mg/dL — AB (ref >39.00)
LDL Cholesterol: 67 mg/dL (ref 0–99)
NONHDL: 88.27
Total CHOL/HDL Ratio: 4
Triglycerides: 106 mg/dL (ref 0.0–149.0)
VLDL: 21.2 mg/dL (ref 0.0–40.0)

## 2017-04-02 LAB — COMPREHENSIVE METABOLIC PANEL
ALK PHOS: 39 U/L (ref 39–117)
ALT: 32 U/L (ref 0–53)
AST: 30 U/L (ref 0–37)
Albumin: 4.4 g/dL (ref 3.5–5.2)
BUN: 12 mg/dL (ref 6–23)
CO2: 25 mEq/L (ref 19–32)
Calcium: 9.7 mg/dL (ref 8.4–10.5)
Chloride: 107 mEq/L (ref 96–112)
Creatinine, Ser: 0.84 mg/dL (ref 0.40–1.50)
GFR: 101.8 mL/min (ref >60.00)
Glucose, Bld: 118 mg/dL — ABNORMAL HIGH (ref 70–99)
POTASSIUM: 4.1 meq/L (ref 3.5–5.1)
SODIUM: 141 meq/L (ref 135–145)
TOTAL PROTEIN: 7.2 g/dL (ref 6.0–8.3)
Total Bilirubin: 0.8 mg/dL (ref 0.2–1.2)

## 2017-04-02 LAB — HEMOGLOBIN A1C: HEMOGLOBIN A1C: 8.1 % — AB (ref 4.6–6.5)

## 2017-04-02 LAB — CBC
HEMATOCRIT: 38.7 % — AB (ref 39.0–52.0)
HEMOGLOBIN: 13.7 g/dL (ref 13.0–17.0)
MCHC: 35.4 g/dL (ref 30.0–36.0)
MCV: 94 fl (ref 78.0–100.0)
PLATELETS: 228 10*3/uL (ref 150.0–400.0)
RBC: 4.11 Mil/uL — ABNORMAL LOW (ref 4.22–5.81)
RDW: 14.6 % (ref 11.5–15.5)
WBC: 4.7 10*3/uL (ref 4.0–10.5)

## 2017-04-02 LAB — TSH: TSH: 1.47 u[IU]/mL (ref 0.35–4.50)

## 2017-04-02 MED ORDER — FENOFIBRATE 160 MG PO TABS: 160.0000 mg | ORAL_TABLET | Freq: Every day | ORAL | 6 refills | Status: DC

## 2017-04-02 MED ORDER — GLIMEPIRIDE 4 MG PO TABS: 4.0000 mg | ORAL_TABLET | Freq: Two times a day (BID) | ORAL | 6 refills | Status: DC

## 2017-04-02 MED ORDER — METFORMIN HCL 1000 MG PO TABS: 1000.0000 mg | ORAL_TABLET | Freq: Two times a day (BID) | ORAL | 6 refills | Status: DC

## 2017-04-02 MED ORDER — LOSARTAN POTASSIUM 50 MG PO TABS: 50.0000 mg | ORAL_TABLET | Freq: Every day | ORAL | 6 refills | Status: DC

## 2017-04-02 MED ORDER — PITAVASTATIN CALCIUM 4 MG PO TABS: 1.0000 | ORAL_TABLET | Freq: Every day | ORAL | 6 refills | Status: DC

## 2017-04-02 NOTE — Patient Instructions (Addendum)
64 oz clear fluids daily Shingrix is the shingles shot, call insurance and confirm they cover it.   Return in 1 month for Prevnar and Shingrix  Hypertension Hypertension, commonly called high blood pressure, is when the force of blood pumping through the arteries is too strong. The arteries are the blood vessels that carry blood from the heart throughout the body. Hypertension forces the heart to work harder to pump blood and may cause arteries to become narrow or stiff. Having untreated or uncontrolled hypertension can cause heart attacks, strokes, kidney disease, and other problems. A blood pressure reading consists of a higher number over a lower number. Ideally, your blood pressure should be below 120/80. The first ("top") number is called the systolic pressure. It is a measure of the pressure in your arteries as your heart beats. The second ("bottom") number is called the diastolic pressure. It is a measure of the pressure in your arteries as the heart relaxes. What are the causes? The cause of this condition is not known. What increases the risk? Some risk factors for high blood pressure are under your control. Others are not. Factors you can change  Smoking.  Having type 2 diabetes mellitus, high cholesterol, or both.  Not getting enough exercise or physical activity.  Being overweight.  Having too much fat, sugar, calories, or salt (sodium) in your diet.  Drinking too much alcohol. Factors that are difficult or impossible to change  Having chronic kidney disease.  Having a family history of high blood pressure.  Age. Risk increases with age.  Race. You may be at higher risk if you are African-American.  Gender. Men are at higher risk than women before age 78. After age 12, women are at higher risk than men.  Having obstructive sleep apnea.  Stress. What are the signs or symptoms? Extremely high blood pressure (hypertensive crisis) may  cause:  Headache.  Anxiety.  Shortness of breath.  Nosebleed.  Nausea and vomiting.  Severe chest pain.  Jerky movements you cannot control (seizures).  How is this diagnosed? This condition is diagnosed by measuring your blood pressure while you are seated, with your arm resting on a surface. The cuff of the blood pressure monitor will be placed directly against the skin of your upper arm at the level of your heart. It should be measured at least twice using the same arm. Certain conditions can cause a difference in blood pressure between your right and left arms. Certain factors can cause blood pressure readings to be lower or higher than normal (elevated) for a short period of time:  When your blood pressure is higher when you are in a health care provider's office than when you are at home, this is called white coat hypertension. Most people with this condition do not need medicines.  When your blood pressure is higher at home than when you are in a health care provider's office, this is called masked hypertension. Most people with this condition may need medicines to control blood pressure.  If you have a high blood pressure reading during one visit or you have normal blood pressure with other risk factors:  You may be asked to return on a different day to have your blood pressure checked again.  You may be asked to monitor your blood pressure at home for 1 week or longer.  If you are diagnosed with hypertension, you may have other blood or imaging tests to help your health care provider understand your overall risk for  other conditions. How is this treated? This condition is treated by making healthy lifestyle changes, such as eating healthy foods, exercising more, and reducing your alcohol intake. Your health care provider may prescribe medicine if lifestyle changes are not enough to get your blood pressure under control, and if:  Your systolic blood pressure is above  130.  Your diastolic blood pressure is above 80.  Your personal target blood pressure may vary depending on your medical conditions, your age, and other factors. Follow these instructions at home: Eating and drinking  Eat a diet that is high in fiber and potassium, and low in sodium, added sugar, and fat. An example eating plan is called the DASH (Dietary Approaches to Stop Hypertension) diet. To eat this way: ? Eat plenty of fresh fruits and vegetables. Try to fill half of your plate at each meal with fruits and vegetables. ? Eat whole grains, such as whole wheat pasta, brown rice, or whole grain bread. Fill about one quarter of your plate with whole grains. ? Eat or drink low-fat dairy products, such as skim milk or low-fat yogurt. ? Avoid fatty cuts of meat, processed or cured meats, and poultry with skin. Fill about one quarter of your plate with lean proteins, such as fish, chicken without skin, beans, eggs, and tofu. ? Avoid premade and processed foods. These tend to be higher in sodium, added sugar, and fat.  Reduce your daily sodium intake. Most people with hypertension should eat less than 1,500 mg of sodium a day.  Limit alcohol intake to no more than 1 drink a day for nonpregnant women and 2 drinks a day for men. One drink equals 12 oz of beer, 5 oz of wine, or 1 oz of hard liquor. Lifestyle  Work with your health care provider to maintain a healthy body weight or to lose weight. Ask what an ideal weight is for you.  Get at least 30 minutes of exercise that causes your heart to beat faster (aerobic exercise) most days of the week. Activities may include walking, swimming, or biking.  Include exercise to strengthen your muscles (resistance exercise), such as pilates or lifting weights, as part of your weekly exercise routine. Try to do these types of exercises for 30 minutes at least 3 days a week.  Do not use any products that contain nicotine or tobacco, such as cigarettes and  e-cigarettes. If you need help quitting, ask your health care provider.  Monitor your blood pressure at home as told by your health care provider.  Keep all follow-up visits as told by your health care provider. This is important. Medicines  Take over-the-counter and prescription medicines only as told by your health care provider. Follow directions carefully. Blood pressure medicines must be taken as prescribed.  Do not skip doses of blood pressure medicine. Doing this puts you at risk for problems and can make the medicine less effective.  Ask your health care provider about side effects or reactions to medicines that you should watch for. Contact a health care provider if:  You think you are having a reaction to a medicine you are taking.  You have headaches that keep coming back (recurring).  You feel dizzy.  You have swelling in your ankles.  You have trouble with your vision. Get help right away if:  You develop a severe headache or confusion.  You have unusual weakness or numbness.  You feel faint.  You have severe pain in your chest or abdomen.  You  vomit repeatedly.  You have trouble breathing. Summary  Hypertension is when the force of blood pumping through your arteries is too strong. If this condition is not controlled, it may put you at risk for serious complications.  Your personal target blood pressure may vary depending on your medical conditions, your age, and other factors. For most people, a normal blood pressure is less than 120/80.  Hypertension is treated with lifestyle changes, medicines, or a combination of both. Lifestyle changes include weight loss, eating a healthy, low-sodium diet, exercising more, and limiting alcohol. This information is not intended to replace advice given to you by your health care provider. Make sure you discuss any questions you have with your health care provider. Document Released: 10/01/2005 Document Revised: 08/29/2016  Document Reviewed: 08/29/2016 Elsevier Interactive Patient Education  [employer].

## 2017-04-02 NOTE — Progress Notes (Signed)
Subjective:  I acted as a Education administrator for Dr. Charlett Blake. [name], [place]  Patient ID: Curtis Kennedy, male    DOB: Dec 13, 1965, 51 y.o.   MRN: [id number]  No chief complaint on file.   HPI  Patient is in today for a 3 month follow up. Patient has no acute concerns. No recent febrile illness or acute hospitalizations. Denies CP/palp/SOB/HA/congestion/fevers/GI or GU c/o. Taking meds as prescribed. Had surgery last month to release left hand trigger finger and is doing well. No polyruia or polydipsia. Acknowledges fatigue. Is not checking sugar.    Patient Care Team: Mosie Lukes, MD as PCP - General (Family Medicine) Renelda Loma, OD (Optometry)   Past Medical History:  Diagnosis Date  . Colitis, ulcerative ([place]) Dx'd 2001   Dr. Erlene Quan; [facility]--has been in remission since about 2009  . Diabetes mellitus without complication ([place]) 51 yrs old   type 2 (dx'd after long courses of prednisone for his UC)  . Erectile dysfunction   . Family history of tinea corporis 07/25/2012  . Hyperlipidemia   . Hypertension   . Insomnia   . Low back pain 08/07/2016  . Preventative health care 12/31/2016  . Right shoulder pain 12/31/2016  . Sinusitis 08/28/2015    Past Surgical History:  Procedure Laterality Date  . TRIGGER FINGER RELEASE  2011   b/l middle fingers    Family History  Problem Relation Age of Onset  . Diabetes Father   . Hyperlipidemia Father   . Hypertension Father   . Kidney disease Father        Kidney Failure  . Heart disease Father 28       MI  . Hypertension Sister   . Hypertension Brother   . Diabetes Paternal Grandfather   . Hypertension Brother   . Hyperlipidemia Brother   . Heart disease Brother        Valve Surgery ?  . Diabetes Brother   . Hypertension Brother   . ADD / ADHD Brother     Social History   Social History  . Marital status: Married    Spouse name: N/A  . Number of children: N/A  . Years of education: N/A   Occupational History  .  Not on file.   Social History Main Topics  . Smoking status: Never Smoker  . Smokeless tobacco: Never Used  . Alcohol use No  . Drug use: No  . 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 Not on file   Social History Narrative   Married, 53 y/o girl.     Lives in [place], grew up in [place]--relocated to [facility] 2007.   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          Outpatient Medications Prior to Visit  Medication Sig Dispense Refill  . acetaminophen-codeine (TYLENOL #3) 300-30 MG tablet Take 1-2 tablets by mouth every 8 (eight) hours as needed for moderate pain. 30 tablet 0  . albuterol (PROVENTIL HFA;VENTOLIN HFA) 108 (90 Base) MCG/ACT inhaler Inhale 2 puffs into the lungs every 6 (six) hours as needed for wheezing or shortness of breath. 1 Inhaler 0  . ALPRAZolam (XANAX) 0.5 MG tablet Take 1 tablet (0.5 mg total) by mouth 2 (two) times daily as needed for anxiety. 60 tablet 1  .  azaTHIOprine (IMURAN) 50 MG tablet Take 4.5 tabs daily 135 tablet 5  . balsalazide (COLAZAL) 750 MG capsule Take 3 capsules (2,250 mg total) by mouth 3 (three) times daily. 270 capsule 1  . busPIRone (BUSPAR) 5 MG tablet Take 5 mg by mouth 2 (two) times daily.    . Dulaglutide (TRULICITY) 1.5 XB/2.8UX SOPN Inject 1.5 mg into the skin once a week. Failed Victoza 4 pen 3  . fluticasone (FLONASE) 50 MCG/ACT nasal spray Place 2 sprays into both nostrils daily. 16 g 1  . fluticasone (FLOVENT HFA) 110 MCG/ACT inhaler Inhale 2 puffs into the lungs 2 (two) times daily. 1 Inhaler 2  . fluticasone (FLOVENT HFA) 110 MCG/ACT inhaler Inhale 2 puffs into the lungs 2 (two) times daily. 1 Inhaler 2  . glucose blood (ACCU-CHEK AVIVA PLUS) test strip Use twice daily to check blood sugar.  DX E11.9 100 each 4  . Insulin Pen Needle (DROPLET PEN NEEDLES) 32G X 4 MM MISC Use  1x a day 100 each 11  . Lancets (ONETOUCH ULTRASOFT) lancets Use as directed twice daily to check blood sugar.  DX E11.9 100 each 6  . LEVEMIR FLEXTOUCH 100 UNIT/ML Pen INJECT 40 TO 50 UNITS DAILY 15 mL 2  . levocetirizine (XYZAL) 5 MG tablet Take 1 tablet (5 mg total) by mouth every evening. 90 tablet 1  . meloxicam (MOBIC) 15 MG tablet Take 15 mg by mouth daily.    . metoprolol succinate (TOPROL-XL) 50 MG 24 hr tablet Take 1 tablet (50 mg total) by mouth daily. Take with or immediately following a meal. 30 tablet 6  . olopatadine (PATANOL) 0.1 % ophthalmic solution Place 1 drop into both eyes 2 (two) times daily. 5 mL 3  . sildenafil (REVATIO) 20 MG tablet 1 tab po 2-5 tabs po as needed for sexual activity. 50 tablet 0  . tadalafil (CIALIS) 5 MG tablet Take 5 mg by mouth daily as needed.    Marland Kitchen tiZANidine (ZANAFLEX) 4 MG tablet Take 1 tablet (4 mg total) by mouth every 6 (six) hours as needed for muscle spasms. 30 tablet 0  . valACYclovir (VALTREX) 1000 MG tablet Take 1 tablet (1,000 mg total) by mouth 2 (two) times daily as needed. 30 tablet 6  . fenofibrate 160 MG tablet Take 1 tablet (160 mg total) by mouth daily. 30 tablet 6  . glimepiride (AMARYL) 4 MG tablet Take 1 tablet (4 mg total) by mouth 2 (two) times daily. 60 tablet 6  . losartan (COZAAR) 50 MG tablet Take 1 tablet (50 mg total) by mouth daily. 30 tablet 6  . metFORMIN (GLUCOPHAGE) 500 MG tablet Take 2 tablets (1,000 mg total) by mouth 2 (two) times daily with a meal. 180 tablet 6  . Pitavastatin Calcium (LIVALO) 4 MG TABS Take 1 tablet (4 mg total) by mouth daily. 30 tablet 6   No facility-administered medications prior to visit.     Allergies  Allergen Reactions  . Simvastatin     Myalgias    Review of Systems  Constitutional: Positive for malaise/fatigue. Negative for fever.  HENT: Negative for congestion.   Eyes: Negative for blurred vision.  Respiratory: Negative for cough and shortness of breath.   Cardiovascular:  Negative for chest pain, palpitations and leg swelling.  Gastrointestinal: Negative for vomiting.  Musculoskeletal: Negative for back pain.  Skin: Negative for rash.  Neurological: Negative for loss of consciousness and headaches.       Objective:    Physical Exam  Constitutional: He is oriented to person, place, and time. He appears well-developed and well-nourished. No distress.  HENT:  Head: Normocephalic and atraumatic.  Nose: Nose normal.  Eyes: Conjunctivae are normal. Right eye exhibits no discharge. Left eye exhibits no discharge.  Neck: Normal range of motion. Neck supple. No thyromegaly present.  Cardiovascular: Normal rate and regular rhythm.   No murmur heard. Pulmonary/Chest: Effort normal and breath sounds normal. He has no wheezes.  Abdominal: Soft. Bowel sounds are normal. There is no tenderness.  Musculoskeletal: Normal range of motion. He exhibits no edema or deformity.  Neurological: He is alert and oriented to person, place, and time.  Skin: Skin is warm and dry. He is not diaphoretic.  Psychiatric: He has a normal mood and affect.  Nursing note and vitals reviewed.   BP 112/70 (BP Location: Left Arm, Patient Position: Sitting, Cuff Size: Normal)   Pulse 70   Temp 97.5 F (36.4 C) (Oral)   Resp 18   Wt 188 lb 12.8 oz (85.6 kg)   SpO2 98%   BMI 30.47 kg/m  Wt Readings from Last 3 Encounters:  04/02/17 188 lb 12.8 oz (85.6 kg)  03/18/17 182 lb 6.4 oz (82.7 kg)  12/31/16 187 lb 9.6 oz (85.1 kg)   BP Readings from Last 3 Encounters:  04/02/17 112/70  03/18/17 131/78  12/31/16 116/67     Immunization History  Administered Date(s) Administered  . Influenza Split 08/21/2012  . Influenza,inj,Quad PF,36+ Mos 08/07/2016  . Influenza-Unspecified 07/27/2015    Health Maintenance  Topic Date Due  . FOOT EXAM  08/21/2013  . COLONOSCOPY  10/13/2014  . OPHTHALMOLOGY EXAM  02/19/2017  . PNEUMOCOCCAL POLYSACCHARIDE VACCINE (1) 03/15/2018 (Originally  10/13/1966)  . TETANUS/TDAP  03/15/2018 (Originally 10/14/1983)  . INFLUENZA VACCINE  05/15/2017  . HEMOGLOBIN A1C  07/03/2017  . Hepatitis C Screening  Completed  . HIV Screening  Completed    Lab Results  Component Value Date   WBC 3.5 (L) 12/31/2016   HGB 13.3 12/31/2016   HCT 37.7 (L) 12/31/2016   PLT 214.0 12/31/2016   GLUCOSE 154 (H) 12/31/2016   CHOL 108 12/31/2016   TRIG 54.0 12/31/2016   HDL 41.50 12/31/2016   LDLCALC 55 12/31/2016   ALT 62 (H) 12/31/2016   AST 38 (H) 12/31/2016   NA 140 12/31/2016   K 4.4 12/31/2016   CL 107 12/31/2016   CREATININE 0.84 12/31/2016   BUN 10 12/31/2016   CO2 26 12/31/2016   TSH 2.27 12/31/2016   PSA 0.58 12/31/2016   HGBA1C 8.2 (H) 12/31/2016   MICROALBUR 7.3 (H) 11/24/2015    Lab Results  Component Value Date   TSH 2.27 12/31/2016   Lab Results  Component Value Date   WBC 3.5 (L) 12/31/2016   HGB 13.3 12/31/2016   HCT 37.7 (L) 12/31/2016   MCV 94.6 12/31/2016   PLT 214.0 12/31/2016   Lab Results  Component Value Date   NA 140 12/31/2016   K 4.4 12/31/2016   CO2 26 12/31/2016   GLUCOSE 154 (H) 12/31/2016   BUN 10 12/31/2016   CREATININE 0.84 12/31/2016   BILITOT 0.8 12/31/2016   ALKPHOS 51 12/31/2016   AST 38 (H) 12/31/2016   ALT 62 (H) 12/31/2016   PROT 6.5 12/31/2016   ALBUMIN 4.3 12/31/2016   CALCIUM 9.3 12/31/2016   GFR 101.90 12/31/2016   Lab Results  Component Value Date   CHOL 108 12/31/2016   Lab Results  Component Value Date  HDL 41.50 12/31/2016   Lab Results  Component Value Date   LDLCALC 55 12/31/2016   Lab Results  Component Value Date   TRIG 54.0 12/31/2016   Lab Results  Component Value Date   CHOLHDL 3 12/31/2016   Lab Results  Component Value Date   HGBA1C 8.2 (H) 12/31/2016         Assessment & Plan:   Problem List Items Addressed This Visit    Hypertension    Well controlled, no changes to meds. Encouraged heart healthy diet such as the DASH diet and exercise as  tolerated.       Relevant Medications   losartan (COZAAR) 50 MG tablet   fenofibrate 160 MG tablet   Pitavastatin Calcium (LIVALO) 4 MG TABS   glimepiride (AMARYL) 4 MG tablet   Other Relevant Orders   CBC   Comprehensive metabolic panel   TSH   Hyperlipidemia   Relevant Medications   losartan (COZAAR) 50 MG tablet   fenofibrate 160 MG tablet   Pitavastatin Calcium (LIVALO) 4 MG TABS   glimepiride (AMARYL) 4 MG tablet   Other Relevant Orders   Lipid panel   Colitis, ulcerative ([place])    Doing well      Uncontrolled type 2 diabetes mellitus without complication, without long-term current use of insulin ([facility])    hgba1c unacceptable, minimize simple carbs. Increase exercise as tolerated. Continue current meds, using 46 units of Levemir. Is not checking his sugars.      Relevant Medications   losartan (COZAAR) 50 MG tablet   metFORMIN (GLUCOPHAGE) 1000 MG tablet   fenofibrate 160 MG tablet   Pitavastatin Calcium (LIVALO) 4 MG TABS   glimepiride (AMARYL) 4 MG tablet   Other Relevant Orders   Hemoglobin A1c   Trigger finger of left hand    Had surgery last month with good results      Preventative health care    Agrees to Tdap today. prevnar and Shingrix in 1 mn with nurse visit         I have discontinued Mr. Tennis Must Risio's metFORMIN. I am also having him start on metFORMIN. Additionally, I am having him maintain his meloxicam, tadalafil, busPIRone, fluticasone, ALPRAZolam, sildenafil, Insulin Pen Needle, onetouch ultrasoft, glucose blood, valACYclovir, fluticasone, albuterol, fluticasone, acetaminophen-codeine, tiZANidine, metoprolol succinate, azaTHIOprine, LEVEMIR FLEXTOUCH, balsalazide, Dulaglutide, levocetirizine, olopatadine, losartan, fenofibrate, Pitavastatin Calcium, and glimepiride.  Meds ordered this encounter  Medications  . losartan (COZAAR) 50 MG tablet    Sig: Take 1 tablet (50 mg total) by mouth daily.    Dispense:  30 tablet    Refill:  6  . metFORMIN  (GLUCOPHAGE) 1000 MG tablet    Sig: Take 1 tablet (1,000 mg total) by mouth 2 (two) times daily with a meal.    Dispense:  180 tablet    Refill:  6  . fenofibrate 160 MG tablet    Sig: Take 1 tablet (160 mg total) by mouth daily.    Dispense:  30 tablet    Refill:  6  . Pitavastatin Calcium (LIVALO) 4 MG TABS    Sig: Take 1 tablet (4 mg total) by mouth daily.    Dispense:  30 tablet    Refill:  6  . glimepiride (AMARYL) 4 MG tablet    Sig: Take 1 tablet (4 mg total) by mouth 2 (two) times daily.    Dispense:  60 tablet    Refill:  6    CMA served as scribe during this visit.  History, Physical and Plan performed by medical provider. Documentation and orders reviewed and attested to.  Penni Homans, MD

## 2017-04-02 NOTE — Assessment & Plan Note (Signed)
Well controlled, no changes to meds. Encouraged heart healthy diet such as the DASH diet and exercise as tolerated.  °

## 2017-04-02 NOTE — Assessment & Plan Note (Signed)
Agrees to Tdap today. prevnar and Shingrix in 1 mn with nurse visit

## 2017-04-02 NOTE — Assessment & Plan Note (Signed)
Had surgery last month with good results

## 2017-04-02 NOTE — Assessment & Plan Note (Addendum)
hgba1c unacceptable, minimize simple carbs. Increase exercise as tolerated. Continue current meds, using 46 units of Levemir. Is not checking his sugars.

## 2017-04-02 NOTE — Assessment & Plan Note (Signed)
Doing well 

## 2017-04-04 ENCOUNTER — Other Ambulatory Visit: Payer: Self-pay | Admitting: Family Medicine

## 2017-04-11 ENCOUNTER — Other Ambulatory Visit: Payer: Self-pay | Admitting: Family Medicine

## 2017-04-11 DIAGNOSIS — I10 Essential (primary) hypertension: Secondary | ICD-10-CM

## 2017-04-11 DIAGNOSIS — E785 Hyperlipidemia, unspecified: Secondary | ICD-10-CM

## 2017-04-11 DIAGNOSIS — E1165 Type 2 diabetes mellitus with hyperglycemia: Secondary | ICD-10-CM

## 2017-04-11 DIAGNOSIS — IMO0001 Reserved for inherently not codable concepts without codable children: Secondary | ICD-10-CM

## 2017-04-11 MED FILL — TRULICITY 1.5 MG/0.5 ML PEN: 1.5 | 28 days supply | Qty: 2 | Fill #0

## 2017-04-11 MED FILL — LEVEMIR FLEXTOUCH 100 UNITS: 100 | 30 days supply | Qty: 15 | Fill #2

## 2017-04-26 ENCOUNTER — Other Ambulatory Visit: Payer: Self-pay | Admitting: Family Medicine

## 2017-04-26 DIAGNOSIS — E7849 Other hyperlipidemia: Secondary | ICD-10-CM

## 2017-04-26 DIAGNOSIS — I10 Essential (primary) hypertension: Secondary | ICD-10-CM

## 2017-04-26 DIAGNOSIS — E1165 Type 2 diabetes mellitus with hyperglycemia: Secondary | ICD-10-CM

## 2017-04-26 DIAGNOSIS — IMO0001 Reserved for inherently not codable concepts without codable children: Secondary | ICD-10-CM

## 2017-04-30 ENCOUNTER — Ambulatory Visit: Payer: BLUE CROSS/BLUE SHIELD

## 2017-05-09 MED FILL — TRULICITY 1.5 MG/0.5 ML PEN: 1.5 | 28 days supply | Qty: 2 | Fill #1

## 2017-05-15 ENCOUNTER — Other Ambulatory Visit: Payer: Self-pay | Admitting: Family Medicine

## 2017-05-15 MED FILL — LEVEMIR FLEXTOUCH 100 UNITS: 100 | 30 days supply | Qty: 15 | Fill #0

## 2017-05-16 ENCOUNTER — Ambulatory Visit: Payer: BLUE CROSS/BLUE SHIELD

## 2017-06-04 MED FILL — TRULICITY 1.5 MG/0.5 ML PEN: 1.5 | 28 days supply | Qty: 2 | Fill #2

## 2017-06-11 MED FILL — LEVEMIR FLEXTOUCH 100 UNITS: 100 | 30 days supply | Qty: 15 | Fill #1

## 2017-06-30 ENCOUNTER — Other Ambulatory Visit: Payer: Self-pay | Admitting: Family Medicine

## 2017-06-30 DIAGNOSIS — I10 Essential (primary) hypertension: Secondary | ICD-10-CM

## 2017-06-30 DIAGNOSIS — E1165 Type 2 diabetes mellitus with hyperglycemia: Secondary | ICD-10-CM

## 2017-06-30 DIAGNOSIS — E7849 Other hyperlipidemia: Secondary | ICD-10-CM

## 2017-06-30 DIAGNOSIS — IMO0001 Reserved for inherently not codable concepts without codable children: Secondary | ICD-10-CM

## 2017-07-02 ENCOUNTER — Encounter: Payer: Self-pay | Admitting: Family Medicine

## 2017-07-02 ENCOUNTER — Ambulatory Visit (INDEPENDENT_AMBULATORY_CARE_PROVIDER_SITE_OTHER): Payer: BLUE CROSS/BLUE SHIELD | Admitting: Family Medicine

## 2017-07-02 VITALS — BP 110/60 | HR 75 | Temp 98.2°F | Resp 18 | Wt 185.4 lb

## 2017-07-02 DIAGNOSIS — E782 Mixed hyperlipidemia: Secondary | ICD-10-CM

## 2017-07-02 DIAGNOSIS — K5180 Other ulcerative colitis without complications: Secondary | ICD-10-CM

## 2017-07-02 DIAGNOSIS — D649 Anemia, unspecified: Secondary | ICD-10-CM

## 2017-07-02 DIAGNOSIS — Z0283 Encounter for blood-alcohol and blood-drug test: Secondary | ICD-10-CM

## 2017-07-02 DIAGNOSIS — E1165 Type 2 diabetes mellitus with hyperglycemia: Secondary | ICD-10-CM

## 2017-07-02 DIAGNOSIS — IMO0001 Reserved for inherently not codable concepts without codable children: Secondary | ICD-10-CM

## 2017-07-02 DIAGNOSIS — G8929 Other chronic pain: Secondary | ICD-10-CM

## 2017-07-02 DIAGNOSIS — I10 Essential (primary) hypertension: Secondary | ICD-10-CM

## 2017-07-02 DIAGNOSIS — M545 Low back pain: Secondary | ICD-10-CM

## 2017-07-02 HISTORY — DX: Anemia, unspecified: D64.9

## 2017-07-02 MED ORDER — INSULIN DETEMIR 100 UNIT/ML FLEXPEN: 50.0000 [IU] | PEN_INJECTOR | Freq: Every day | SUBCUTANEOUS | 2 refills | Status: DC

## 2017-07-02 MED ORDER — TRAMADOL HCL 50 MG PO TABS: 50.0000 mg | ORAL_TABLET | Freq: Three times a day (TID) | ORAL | 0 refills | Status: DC | PRN

## 2017-07-02 NOTE — Assessment & Plan Note (Signed)
hgba1c acceptable, minimize simple carbs. Increase exercise as tolerated. Continue current meds 

## 2017-07-02 NOTE — Assessment & Plan Note (Signed)
Encouraged moist heat and gentle stretching as tolerated. May try NSAIDs and prescription meds as directed and report if symptoms worsen or seek immediate care. Uses Tramadol infrequently when back pain is severe. Pain is persistent despite chiropractic adjustments.

## 2017-07-02 NOTE — Assessment & Plan Note (Signed)
Encouraged heart healthy diet, increase exercise, avoid trans fats, consider a krill oil cap daily 

## 2017-07-02 NOTE — Assessment & Plan Note (Signed)
Increase leafy greens, consider increased lean red meat and using cast iron cookware. Continue to monitor, report any concerns 

## 2017-07-02 NOTE — Assessment & Plan Note (Signed)
Well controlled, no changes to meds. Encouraged heart healthy diet such as the DASH diet and exercise as tolerated.  °

## 2017-07-02 NOTE — Patient Instructions (Addendum)
Shingrix 2 shots for shingles  Carbohydrate Counting for Diabetes Mellitus, Adult Carbohydrate counting is a method for keeping track of how many carbohydrates you eat. Eating carbohydrates naturally increases the amount of sugar (glucose) in the blood. Counting how many carbohydrates you eat helps keep your blood glucose within normal limits, which helps you manage your diabetes (diabetes mellitus). It is important to know how many carbohydrates you can safely have in each meal. This is different for every person. A diet and nutrition specialist (registered dietitian) can help you make a meal plan and calculate how many carbohydrates you should have at each meal and snack. Carbohydrates are found in the following foods:  Grains, such as breads and cereals.  Dried beans and soy products.  Starchy vegetables, such as potatoes, peas, and corn.  Fruit and fruit juices.  Milk and yogurt.  Sweets and snack foods, such as cake, cookies, candy, chips, and soft drinks.  How do I count carbohydrates? There are two ways to count carbohydrates in food. You can use either of the methods or a combination of both. Reading "Nutrition Facts" on packaged food The "Nutrition Facts" list is included on the labels of almost all packaged foods and beverages in the [place] It includes:  The serving size.  Information about nutrients in each serving, including the grams (g) of carbohydrate per serving.  To use the "Nutrition Facts":  Decide how many servings you will have.  Multiply the number of servings by the number of carbohydrates per serving.  The resulting number is the total amount of carbohydrates that you will be having.  Learning standard serving sizes of other foods When you eat foods containing carbohydrates that are not packaged or do not include "Nutrition Facts" on the label, you need to measure the servings in order to count the amount of carbohydrates:  Measure the foods that you will  eat with a food scale or measuring cup, if needed.  Decide how many standard-size servings you will eat.  Multiply the number of servings by 15. Most carbohydrate-rich foods have about 15 g of carbohydrates per serving. ? For example, if you eat 8 oz (170 g) of strawberries, you will have eaten 2 servings and 30 g of carbohydrates (2 servings x 15 g = 30 g).  For foods that have more than one food mixed, such as soups and casseroles, you must count the carbohydrates in each food that is included.  The following list contains standard serving sizes of common carbohydrate-rich foods. Each of these servings has about 15 g of carbohydrates:   hamburger bun or  English muffin.   oz (15 mL) syrup.   oz (14 g) jelly.  1 slice of bread.  1 six-inch tortilla.  3 oz (85 g) cooked rice or pasta.  4 oz (113 g) cooked dried beans.  4 oz (113 g) starchy vegetable, such as peas, corn, or potatoes.  4 oz (113 g) hot cereal.  4 oz (113 g) mashed potatoes or  of a large baked potato.  4 oz (113 g) canned or frozen fruit.  4 oz (120 mL) fruit juice.  4-6 crackers.  6 chicken nuggets.  6 oz (170 g) unsweetened dry cereal.  6 oz (170 g) plain fat-free yogurt or yogurt sweetened with artificial sweeteners.  8 oz (240 mL) milk.  8 oz (170 g) fresh fruit or one small piece of fruit.  24 oz (680 g) popped popcorn.  Example of carbohydrate counting Sample meal  3 oz (85 g) chicken breast.  6 oz (170 g) brown rice.  4 oz (113 g) corn.  8 oz (240 mL) milk.  8 oz (170 g) strawberries with sugar-free whipped topping. Carbohydrate calculation 1. Identify the foods that contain carbohydrates: ? Rice. ? Corn. ? Milk. ? Strawberries. 2. Calculate how many servings you have of each food: ? 2 servings rice. ? 1 serving corn. ? 1 serving milk. ? 1 serving strawberries. 3. Multiply each number of servings by 15 g: ? 2 servings rice x 15 g = 30 g. ? 1 serving corn x 15 g = 15  g. ? 1 serving milk x 15 g = 15 g. ? 1 serving strawberries x 15 g = 15 g. 4. Add together all of the amounts to find the total grams of carbohydrates eaten: ? 30 g + 15 g + 15 g + 15 g = 75 g of carbohydrates total. This information is not intended to replace advice given to you by your health care provider. Make sure you discuss any questions you have with your health care provider. Document Released: 10/01/2005 Document Revised: 04/20/2016 Document Reviewed: 03/14/2016 Elsevier Interactive Patient Education  [employer].

## 2017-07-02 NOTE — Progress Notes (Signed)
Subjective:  I acted as a Education administrator for Dr. Charlett Blake. [name], [place]  Patient ID: Curtis Kennedy, male    DOB: December 01, 1965, 51 y.o.   MRN: [id number]  No chief complaint on file.   HPI  Patient is in today for a 3 month follow up. He is following up on his HTn, Dm and other medical concerns. Patient has no acute concerns. No recent febrile illness or acute hospitalizations. Denies CP/palp/SOB/HA/congestion/fevers/GI or GU c/o. Taking meds as prescribed He denies polyuria or polydipsia. Is trying to exercise more and trying to eat well. Continues to struggle with low back and shoulder pain at times.    Patient Care Team: Mosie Lukes, MD as PCP - General (Family Medicine) Renelda Loma, OD (Optometry)   Past Medical History:  Diagnosis Date  . Anemia 07/02/2017  . Colitis, ulcerative ([place]) Dx'd 2001   Dr. Erlene Quan; [facility]--has been in remission since about 2009  . Diabetes mellitus without complication ([place]) 51 yrs old   type 2 (dx'd after long courses of prednisone for his UC)  . Erectile dysfunction   . Family history of tinea corporis 07/25/2012  . Hyperlipidemia   . Hypertension   . Insomnia   . Low back pain 08/07/2016  . Preventative health care 12/31/2016  . Right shoulder pain 12/31/2016  . Sinusitis 08/28/2015    Past Surgical History:  Procedure Laterality Date  . TRIGGER FINGER RELEASE  2011   b/l middle fingers    Family History  Problem Relation Age of Onset  . Diabetes Father   . Hyperlipidemia Father   . Hypertension Father   . Kidney disease Father        Kidney Failure  . Heart disease Father 32       MI  . Hypertension Sister   . Hypertension Brother   . Diabetes Paternal Grandfather   . Hypertension Brother   . Hyperlipidemia Brother   . Heart disease Brother        Valve Surgery ?  . Diabetes Brother   . Hypertension Brother   . ADD / ADHD Brother     Social History   Social History  . Marital status: Married    Spouse name: N/A  .  Number of children: N/A  . Years of education: N/A   Occupational History  . Not on file.   Social History Main Topics  . Smoking status: Never Smoker  . Smokeless tobacco: Never Used  . Alcohol use No  . Drug use: No  . 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 Not on file   Social History Narrative   Married, 53 y/o girl.     Lives in [place], grew up in [place]--relocated to [facility] 2007.   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          Outpatient Medications Prior to Visit  Medication Sig Dispense Refill  . acetaminophen-codeine (TYLENOL #3) 300-30 MG tablet Take 1-2 tablets by mouth every 8 (eight) hours as needed for moderate pain. 30 tablet 0  . albuterol (PROVENTIL HFA;VENTOLIN HFA) 108 (90 Base) MCG/ACT inhaler Inhale 2 puffs into the lungs every 6 (six) hours as needed for wheezing or shortness of breath. 1 Inhaler 0  . ALPRAZolam (XANAX) 0.5 MG tablet Take  1 tablet (0.5 mg total) by mouth 2 (two) times daily as needed for anxiety. 60 tablet 1  . azaTHIOprine (IMURAN) 50 MG tablet Take 4.5 tabs daily 135 tablet 5  . balsalazide (COLAZAL) 750 MG capsule Take 3 capsules (2,250 mg total) by mouth 3 (three) times daily. 270 capsule 3  . busPIRone (BUSPAR) 5 MG tablet Take 5 mg by mouth 2 (two) times daily.    . Dulaglutide (TRULICITY) 1.5 SP/2.3RA SOPN Inject 1.5 mg into the skin once a week. Failed Victoza 4 pen 3  . fenofibrate 160 MG tablet Take 1 tablet (160 mg total) by mouth daily. 30 tablet 6  . fluticasone (FLONASE) 50 MCG/ACT nasal spray Place 2 sprays into both nostrils daily. 16 g 1  . fluticasone (FLOVENT HFA) 110 MCG/ACT inhaler Inhale 2 puffs into the lungs 2 (two) times daily. 1 Inhaler 2  . fluticasone (FLOVENT HFA) 110 MCG/ACT inhaler Inhale 2 puffs into the lungs 2 (two) times daily. 1 Inhaler 2  .  glimepiride (AMARYL) 4 MG tablet Take 1 tablet (4 mg total) by mouth 2 (two) times daily. 60 tablet 6  . glucose blood (ACCU-CHEK AVIVA PLUS) test strip Use twice daily to check blood sugar.  DX E11.9 100 each 4  . Insulin Pen Needle (DROPLET PEN NEEDLES) 32G X 4 MM MISC Use 1x a day 100 each 11  . Lancets (ONETOUCH ULTRASOFT) lancets Use as directed twice daily to check blood sugar.  DX E11.9 100 each 6  . levocetirizine (XYZAL) 5 MG tablet Take 1 tablet (5 mg total) by mouth every evening. 90 tablet 1  . losartan (COZAAR) 50 MG tablet Take 1 tablet (50 mg total) by mouth daily. 30 tablet 6  . losartan (COZAAR) 50 MG tablet TAKE 1 TABLET BY MOUTH DAILY 30 tablet 0  . losartan (COZAAR) 50 MG tablet TAKE 1 TABLET BY MOUTH DAILY 30 tablet 0  . meloxicam (MOBIC) 15 MG tablet Take 15 mg by mouth daily.    . metFORMIN (GLUCOPHAGE) 1000 MG tablet Take 1 tablet (1,000 mg total) by mouth 2 (two) times daily with a meal. 180 tablet 6  . metoprolol succinate (TOPROL-XL) 50 MG 24 hr tablet Take 1 tablet (50 mg total) by mouth daily. Take with or immediately following a meal. 30 tablet 6  . olopatadine (PATANOL) 0.1 % ophthalmic solution Place 1 drop into both eyes 2 (two) times daily. 5 mL 3  . Pitavastatin Calcium (LIVALO) 4 MG TABS Take 1 tablet (4 mg total) by mouth daily. 30 tablet 6  . sildenafil (REVATIO) 20 MG tablet 1 tab po 2-5 tabs po as needed for sexual activity. 50 tablet 0  . tadalafil (CIALIS) 5 MG tablet Take 5 mg by mouth daily as needed.    Marland Kitchen tiZANidine (ZANAFLEX) 4 MG tablet Take 1 tablet (4 mg total) by mouth every 6 (six) hours as needed for muscle spasms. 30 tablet 0  . valACYclovir (VALTREX) 1000 MG tablet Take 1 tablet (1,000 mg total) by mouth 2 (two) times daily as needed. 30 tablet 6  . LEVEMIR FLEXTOUCH 100 UNIT/ML Pen INJECT 40 TO 50 UNITS DAILY UNDER THE SKIN 15 mL 2   No facility-administered medications prior to visit.     Allergies  Allergen Reactions  . Simvastatin      Myalgias    Review of Systems  Constitutional: Negative for fever and malaise/fatigue.  HENT: Negative for congestion.   Eyes: Negative for blurred vision.  Respiratory: Negative for  cough and shortness of breath.   Cardiovascular: Negative for chest pain, palpitations and leg swelling.  Gastrointestinal: Negative for vomiting.  Musculoskeletal: Positive for back pain and joint pain.  Skin: Negative for rash.  Neurological: Negative for loss of consciousness and headaches.       Objective:    Physical Exam  Constitutional: He is oriented to person, place, and time. He appears well-developed and well-nourished. No distress.  HENT:  Head: Normocephalic and atraumatic.  Eyes: Conjunctivae are normal.  Neck: Normal range of motion. No thyromegaly present.  Cardiovascular: Normal rate and regular rhythm.   Pulmonary/Chest: Effort normal and breath sounds normal. He has no wheezes.  Abdominal: Soft. Bowel sounds are normal. There is no tenderness.  Musculoskeletal: Normal range of motion. He exhibits no edema or deformity.  Neurological: He is alert and oriented to person, place, and time.  Skin: Skin is warm and dry. He is not diaphoretic.  Psychiatric: He has a normal mood and affect.    BP 110/60 (BP Location: Left Arm, Patient Position: Sitting, Cuff Size: Normal)   Pulse 75   Temp 98.2 F (36.8 C) (Oral)   Resp 18   Wt 185 lb 6.4 oz (84.1 kg)   SpO2 98%   BMI 29.92 kg/m  Wt Readings from Last 3 Encounters:  07/02/17 185 lb 6.4 oz (84.1 kg)  04/02/17 188 lb 12.8 oz (85.6 kg)  03/18/17 182 lb 6.4 oz (82.7 kg)   BP Readings from Last 3 Encounters:  07/02/17 110/60  04/02/17 112/70  03/18/17 131/78     Immunization History  Administered Date(s) Administered  . Influenza Split 08/21/2012  . Influenza,inj,Quad PF,6+ Mos 08/07/2016  . Influenza-Unspecified 07/27/2015  . Tdap 04/02/2017    Health Maintenance  Topic Date Due  . FOOT EXAM  08/21/2013  .  COLONOSCOPY  10/13/2014  . OPHTHALMOLOGY EXAM  02/19/2017  . INFLUENZA VACCINE  05/15/2017  . PNEUMOCOCCAL POLYSACCHARIDE VACCINE (1) 03/15/2018 (Originally 10/13/1966)  . HEMOGLOBIN A1C  10/02/2017  . TETANUS/TDAP  04/03/2027  . Hepatitis C Screening  Completed  . HIV Screening  Completed    Lab Results  Component Value Date   WBC 4.9 07/03/2017   HGB 13.5 07/03/2017   HCT 39.3 07/03/2017   PLT 221.0 07/03/2017   GLUCOSE 296 (H) 07/03/2017   CHOL 134 07/03/2017   TRIG 81.0 07/03/2017   HDL 47.80 07/03/2017   LDLCALC 70 07/03/2017   ALT 41 07/03/2017   AST 31 07/03/2017   NA 137 07/03/2017   K 4.1 07/03/2017   CL 102 07/03/2017   CREATININE 0.90 07/03/2017   BUN 14 07/03/2017   CO2 27 07/03/2017   TSH 3.04 07/03/2017   PSA 0.58 12/31/2016   HGBA1C 8.2 (H) 07/03/2017   MICROALBUR 7.3 (H) 11/24/2015    Lab Results  Component Value Date   TSH 3.04 07/03/2017   Lab Results  Component Value Date   WBC 4.9 07/03/2017   HGB 13.5 07/03/2017   HCT 39.3 07/03/2017   MCV 94.7 07/03/2017   PLT 221.0 07/03/2017   Lab Results  Component Value Date   NA 137 07/03/2017   K 4.1 07/03/2017   CO2 27 07/03/2017   GLUCOSE 296 (H) 07/03/2017   BUN 14 07/03/2017   CREATININE 0.90 07/03/2017   BILITOT 0.8 07/03/2017   ALKPHOS 59 07/03/2017   AST 31 07/03/2017   ALT 41 07/03/2017   PROT 7.1 07/03/2017   ALBUMIN 4.3 07/03/2017   CALCIUM 9.8 07/03/2017  GFR 93.92 07/03/2017   Lab Results  Component Value Date   CHOL 134 07/03/2017   Lab Results  Component Value Date   HDL 47.80 07/03/2017   Lab Results  Component Value Date   LDLCALC 70 07/03/2017   Lab Results  Component Value Date   TRIG 81.0 07/03/2017   Lab Results  Component Value Date   CHOLHDL 3 07/03/2017   Lab Results  Component Value Date   HGBA1C 8.2 (H) 07/03/2017         Assessment & Plan:   Problem List Items Addressed This Visit    Hypertension    Well controlled, no changes to meds.  Encouraged heart healthy diet such as the DASH diet and exercise as tolerated.       Relevant Orders   CBC (Completed)   Comprehensive metabolic panel (Completed)   TSH (Completed)   Hyperlipidemia    Encouraged heart healthy diet, increase exercise, avoid trans fats, consider a krill oil cap daily      Relevant Orders   Lipid panel (Completed)   Colitis, ulcerative ([place])    Doing well no recent flares.       Uncontrolled type 2 diabetes mellitus without complication, without long-term current use of insulin ([facility])    hgba1c acceptable, minimize simple carbs. Increase exercise as tolerated. Continue current meds      Relevant Medications   Insulin Detemir (LEVEMIR FLEXTOUCH) 100 UNIT/ML Pen   Other Relevant Orders   Hemoglobin A1c (Completed)   Low back pain    Encouraged moist heat and gentle stretching as tolerated. May try NSAIDs and prescription meds as directed and report if symptoms worsen or seek immediate care. Uses Tramadol infrequently when back pain is severe. Pain is persistent despite chiropractic adjustments.       Relevant Medications   traMADol (ULTRAM) 50 MG tablet   Other Relevant Orders   DG Lumbar Spine 2-3 Views   Anemia    Increase leafy greens, consider increased lean red meat and using cast iron cookware. Continue to monitor, report any concerns      Relevant Orders   CBC (Completed)    Other Visit Diagnoses    Encounter for drug screening    -  Primary   Relevant Orders   Pain Mgmt, Profile 8 w/Conf, U      I have changed Mr. Tennis Must Risio's LEVEMIR FLEXTOUCH to Insulin Detemir. I am also having him maintain his meloxicam, tadalafil, busPIRone, fluticasone, ALPRAZolam, sildenafil, Insulin Pen Needle, onetouch ultrasoft, glucose blood, valACYclovir, fluticasone, albuterol, fluticasone, acetaminophen-codeine, tiZANidine, metoprolol succinate, azaTHIOprine, Dulaglutide, levocetirizine, olopatadine, losartan, metFORMIN, fenofibrate, Pitavastatin Calcium,  glimepiride, balsalazide, losartan, losartan, and traMADol.  Meds ordered this encounter  Medications  . traMADol (ULTRAM) 50 MG tablet    Sig: Take 1 tablet (50 mg total) by mouth every 8 (eight) hours as needed.    Dispense:  60 tablet    Refill:  0  . Insulin Detemir (LEVEMIR FLEXTOUCH) 100 UNIT/ML Pen    Sig: Inject 50 Units into the skin daily at 10 pm.    Dispense:  15 mL    Refill:  2    CMA served as scribe during this visit. History, Physical and Plan performed by medical provider. Documentation and orders reviewed and attested to.  Penni Homans, MD

## 2017-07-03 ENCOUNTER — Other Ambulatory Visit (INDEPENDENT_AMBULATORY_CARE_PROVIDER_SITE_OTHER): Payer: BLUE CROSS/BLUE SHIELD

## 2017-07-03 DIAGNOSIS — E1165 Type 2 diabetes mellitus with hyperglycemia: Secondary | ICD-10-CM

## 2017-07-03 DIAGNOSIS — I10 Essential (primary) hypertension: Secondary | ICD-10-CM | POA: Diagnosis not present

## 2017-07-03 DIAGNOSIS — D649 Anemia, unspecified: Secondary | ICD-10-CM | POA: Diagnosis not present

## 2017-07-03 DIAGNOSIS — E782 Mixed hyperlipidemia: Secondary | ICD-10-CM | POA: Diagnosis not present

## 2017-07-03 DIAGNOSIS — IMO0001 Reserved for inherently not codable concepts without codable children: Secondary | ICD-10-CM

## 2017-07-03 LAB — CBC
HCT: 39.3 % (ref 39.0–52.0)
HEMOGLOBIN: 13.5 g/dL (ref 13.0–17.0)
MCHC: 34.5 g/dL (ref 30.0–36.0)
MCV: 94.7 fl (ref 78.0–100.0)
PLATELETS: 221 10*3/uL (ref 150.0–400.0)
RBC: 4.15 Mil/uL — AB (ref 4.22–5.81)
RDW: 14.9 % (ref 11.5–15.5)
WBC: 4.9 10*3/uL (ref 4.0–10.5)

## 2017-07-03 LAB — COMPREHENSIVE METABOLIC PANEL
ALBUMIN: 4.3 g/dL (ref 3.5–5.2)
ALK PHOS: 59 U/L (ref 39–117)
ALT: 41 U/L (ref 0–53)
AST: 31 U/L (ref 0–37)
BILIRUBIN TOTAL: 0.8 mg/dL (ref 0.2–1.2)
BUN: 14 mg/dL (ref 6–23)
CO2: 27 mEq/L (ref 19–32)
CREATININE: 0.9 mg/dL (ref 0.40–1.50)
Calcium: 9.8 mg/dL (ref 8.4–10.5)
Chloride: 102 mEq/L (ref 96–112)
GFR: 93.92 mL/min (ref >60.00)
GLUCOSE: 296 mg/dL — AB (ref 70–99)
Potassium: 4.1 mEq/L (ref 3.5–5.1)
Sodium: 137 mEq/L (ref 135–145)
TOTAL PROTEIN: 7.1 g/dL (ref 6.0–8.3)

## 2017-07-03 LAB — LIPID PANEL
CHOLESTEROL: 134 mg/dL (ref 0–200)
HDL: 47.8 mg/dL (ref >39.00)
LDL Cholesterol: 70 mg/dL (ref 0–99)
NonHDL: 86.17
TRIGLYCERIDES: 81 mg/dL (ref 0.0–149.0)
Total CHOL/HDL Ratio: 3
VLDL: 16.2 mg/dL (ref 0.0–40.0)

## 2017-07-03 LAB — TSH: TSH: 3.04 u[IU]/mL (ref 0.35–4.50)

## 2017-07-03 LAB — HEMOGLOBIN A1C: HEMOGLOBIN A1C: 8.2 % — AB (ref 4.6–6.5)

## 2017-07-03 NOTE — Assessment & Plan Note (Signed)
Doing well no recent flares.

## 2017-07-04 LAB — PAIN MGMT, PROFILE 8 W/CONF, U
6 ACETYLMORPHINE: NEGATIVE ng/mL (ref <10)
ALCOHOL METABOLITES: NEGATIVE ng/mL (ref <500)
Amphetamines: NEGATIVE ng/mL (ref <500)
Benzodiazepines: NEGATIVE ng/mL (ref <100)
Buprenorphine, Urine: NEGATIVE ng/mL (ref <5)
Cocaine Metabolite: NEGATIVE ng/mL (ref <150)
Creatinine: 106.1 mg/dL
MARIJUANA METABOLITE: NEGATIVE ng/mL (ref <20)
MDA: NEGATIVE ng/mL (ref <200)
MDMA: NEGATIVE ng/mL (ref <200)
MDMA: NEGATIVE ng/mL (ref <500)
OPIATES: NEGATIVE ng/mL (ref <100)
OXYCODONE: NEGATIVE ng/mL (ref <100)
Oxidant: NEGATIVE ug/mL (ref <200)
pH: 6.13 (ref 4.5–9.0)

## 2017-07-05 ENCOUNTER — Other Ambulatory Visit: Payer: Self-pay | Admitting: Family Medicine

## 2017-07-05 DIAGNOSIS — IMO0001 Reserved for inherently not codable concepts without codable children: Secondary | ICD-10-CM

## 2017-07-05 DIAGNOSIS — I10 Essential (primary) hypertension: Secondary | ICD-10-CM

## 2017-07-05 DIAGNOSIS — E1165 Type 2 diabetes mellitus with hyperglycemia: Secondary | ICD-10-CM

## 2017-07-05 DIAGNOSIS — E785 Hyperlipidemia, unspecified: Secondary | ICD-10-CM

## 2017-07-12 ENCOUNTER — Telehealth: Payer: Self-pay

## 2017-07-12 MED FILL — TRULICITY 1.5 MG/0.5 ML PEN: 1.5 | 28 days supply | Qty: 2 | Fill #3

## 2017-07-12 MED FILL — LEVEMIR FLEXTOUCH 100 UNITS: 100 | 30 days supply | Qty: 15 | Fill #2

## 2017-07-12 NOTE — Telephone Encounter (Signed)
-----   Message from Mosie Lukes, MD sent at 07/03/2017  7:50 PM EDT ----- Labs look mostly good. Sugar is actually only up a little. Recommend increasing Levemir by 2 units and increase exercise.

## 2017-07-12 NOTE — Telephone Encounter (Signed)
Called Pt and advised him to increase his levimir by 2 units per Dr's orders. Pt stated that he recalls Dr.Blyth advising him to do the same thing to get his BG levels under 300 and asked if he should just continue to make adjustments until he gets reading of under 100. I told the Pt not to do that. I advised him to increase his dosage by 2 units as advised, monitor his reading and after a week or so report back to [place] to let Dr know if there's any improvement or not. Pt stated that he would comply.

## 2017-07-14 ENCOUNTER — Encounter: Payer: Self-pay | Admitting: Family Medicine

## 2017-07-15 ENCOUNTER — Ambulatory Visit: Payer: BLUE CROSS/BLUE SHIELD | Admitting: Podiatry

## 2017-07-18 ENCOUNTER — Ambulatory Visit (INDEPENDENT_AMBULATORY_CARE_PROVIDER_SITE_OTHER): Payer: BLUE CROSS/BLUE SHIELD | Admitting: Podiatry

## 2017-07-18 ENCOUNTER — Encounter: Payer: Self-pay | Admitting: Podiatry

## 2017-07-18 DIAGNOSIS — E119 Type 2 diabetes mellitus without complications: Secondary | ICD-10-CM | POA: Diagnosis not present

## 2017-07-18 DIAGNOSIS — M6740 Ganglion, unspecified site: Secondary | ICD-10-CM

## 2017-07-18 DIAGNOSIS — Z0189 Encounter for other specified special examinations: Secondary | ICD-10-CM

## 2017-07-19 DIAGNOSIS — M6740 Ganglion, unspecified site: Secondary | ICD-10-CM | POA: Insufficient documentation

## 2017-07-19 NOTE — Progress Notes (Signed)
Subjective: Curtis Kennedy presents the office today for yearly diabetic foot evaluation. He states his last A1c was 8.1 which is down from 10.7 when I last saw him. He denies any numbness or tingling and denies any claudication symptoms to his legs. He denies any wounds or sores he denies any acute changes. He sells assist the right big toe which is about the same and not causing any problems. He has no new concerns today. Denies any systemic complaints such as fevers, chills, nausea, vomiting. No acute changes since last appointment, and no other complaints at this time.   Objective: General: AAO x3, NAD  Dermatological: Skin is warm, dry and supple bilateral. Nails x 10 are well manicured; remaining integument appears unremarkable at this time. There are no open sores, no preulcerative lesions, no rash or signs of infection present.  Vascular: Dorsalis Pedis artery and Posterior Tibial artery pedal pulses are 2/4 bilateral with immedate capillary fill time. Pedal hair growth present. No varicosities and no lower extremity edema present bilateral. There is no pain with calf compression, swelling, warmth, erythema.   Neruologic: Grossly intact via light touch bilateral. Vibratory intact via tuning fork bilateral. Protective threshold with Semmes Wienstein monofilament intact to all pedal sites bilateral.   Musculoskeletal: Small fluid-filled cyst consistent with a ganglion cyst present along the right hallux medial aspect without any tenderness. There is no skin changes overlying this area. No pain, crepitus, or limitation noted with foot and ankle range of motion bilateral. Muscular strength 5/5 in all groups tested bilateral.  Gait: Unassisted, Nonantalgic.    Assessment: Diabetic foot evaluation, doing well; cyst right hallux  Plan: -All treatment options discussed with the patient including all alternatives, risks, complications.  -At this point he is doing well, diabetic foot standpoint.  Discussed with him daily foot inspection. -Discussed with him aspiration of the cyst that he has done this before and has come back. Discussed with him the future can cause any problems surgical excision but for now we'll hold off on this. -RTC 1 year but encouraged to call this any changes.  -Patient encouraged to call the office with any questions, concerns, change in symptoms.   Celesta Gentile, DPM

## 2017-07-29 ENCOUNTER — Other Ambulatory Visit: Payer: Self-pay | Admitting: Family Medicine

## 2017-07-29 DIAGNOSIS — E1165 Type 2 diabetes mellitus with hyperglycemia: Secondary | ICD-10-CM

## 2017-07-29 DIAGNOSIS — I10 Essential (primary) hypertension: Secondary | ICD-10-CM

## 2017-07-29 DIAGNOSIS — IMO0001 Reserved for inherently not codable concepts without codable children: Secondary | ICD-10-CM

## 2017-07-29 DIAGNOSIS — E7849 Other hyperlipidemia: Secondary | ICD-10-CM

## 2017-08-06 ENCOUNTER — Other Ambulatory Visit: Payer: Self-pay | Admitting: Family Medicine

## 2017-08-07 MED FILL — LEVEMIR FLEXTOUCH 100 UNITS: 100 | 30 days supply | Qty: 15 | Fill #0

## 2017-08-07 MED FILL — TRULICITY 1.5 MG/0.5 ML PEN: 1.5 | 28 days supply | Qty: 2 | Fill #0

## 2017-08-28 ENCOUNTER — Other Ambulatory Visit: Payer: Self-pay | Admitting: Family Medicine

## 2017-08-28 DIAGNOSIS — I10 Essential (primary) hypertension: Secondary | ICD-10-CM

## 2017-08-28 DIAGNOSIS — E785 Hyperlipidemia, unspecified: Secondary | ICD-10-CM

## 2017-08-28 DIAGNOSIS — E1165 Type 2 diabetes mellitus with hyperglycemia: Secondary | ICD-10-CM

## 2017-08-28 DIAGNOSIS — IMO0001 Reserved for inherently not codable concepts without codable children: Secondary | ICD-10-CM

## 2017-08-30 ENCOUNTER — Other Ambulatory Visit: Payer: Self-pay | Admitting: Medical

## 2017-09-04 MED FILL — TRULICITY 1.5 MG/0.5 ML PEN: 1.5 | 28 days supply | Qty: 2 | Fill #1

## 2017-09-04 MED FILL — LEVEMIR FLEXTOUCH 100 UNITS: 100 | 30 days supply | Qty: 15 | Fill #1

## 2017-09-09 ENCOUNTER — Telehealth: Payer: Self-pay | Admitting: Family Medicine

## 2017-09-09 ENCOUNTER — Other Ambulatory Visit: Payer: Self-pay | Admitting: Family Medicine

## 2017-09-09 DIAGNOSIS — R1013 Epigastric pain: Secondary | ICD-10-CM

## 2017-09-09 NOTE — Telephone Encounter (Signed)
Copied from [place] [id number]. Topic: Quick Communication - See Telephone Encounter >> Sep 09, 2017 12:09 PM Cleaster Corin, NT wrote: CRM for notification. See Telephone encounter for:   09/09/17. Pt wants to know if Dr. Charlett Blake could schedule an appt. For an ultra sound of his gall bladder. Pt. Stated that him and Dr. Charlett Blake had the conversation about having the ulta sound done. Pt. Is  Having very little pain but  would like to have this done the same day as his appt. 10/04/17. Please give pt a call if able to do so [phone]

## 2017-09-09 NOTE — Telephone Encounter (Signed)
Please advise 

## 2017-09-09 NOTE — Telephone Encounter (Signed)
ordered

## 2017-09-13 ENCOUNTER — Ambulatory Visit (HOSPITAL_BASED_OUTPATIENT_CLINIC_OR_DEPARTMENT_OTHER)
Admission: RE | Admit: 2017-09-13 | Discharge: 2017-09-13 | Disposition: A | Discharge status: Home / Self Care | Payer: BLUE CROSS/BLUE SHIELD | Source: Ambulatory Visit | Attending: Family Medicine | Admitting: Family Medicine

## 2017-09-13 DIAGNOSIS — G8929 Other chronic pain: Secondary | ICD-10-CM

## 2017-09-13 DIAGNOSIS — M5134 Other intervertebral disc degeneration, thoracic region: Secondary | ICD-10-CM | POA: Insufficient documentation

## 2017-09-13 DIAGNOSIS — R1013 Epigastric pain: Secondary | ICD-10-CM | POA: Diagnosis present

## 2017-09-13 DIAGNOSIS — M545 Low back pain: Secondary | ICD-10-CM | POA: Insufficient documentation

## 2017-09-13 DIAGNOSIS — R161 Splenomegaly, not elsewhere classified: Secondary | ICD-10-CM | POA: Insufficient documentation

## 2017-09-27 ENCOUNTER — Other Ambulatory Visit: Payer: Self-pay | Admitting: Family Medicine

## 2017-09-27 DIAGNOSIS — E1165 Type 2 diabetes mellitus with hyperglycemia: Secondary | ICD-10-CM

## 2017-09-27 DIAGNOSIS — I10 Essential (primary) hypertension: Secondary | ICD-10-CM

## 2017-09-27 DIAGNOSIS — E785 Hyperlipidemia, unspecified: Secondary | ICD-10-CM

## 2017-09-27 DIAGNOSIS — IMO0001 Reserved for inherently not codable concepts without codable children: Secondary | ICD-10-CM

## 2017-09-30 ENCOUNTER — Other Ambulatory Visit: Payer: Self-pay

## 2017-09-30 DIAGNOSIS — I10 Essential (primary) hypertension: Secondary | ICD-10-CM

## 2017-09-30 DIAGNOSIS — E1165 Type 2 diabetes mellitus with hyperglycemia: Secondary | ICD-10-CM

## 2017-09-30 DIAGNOSIS — E785 Hyperlipidemia, unspecified: Secondary | ICD-10-CM

## 2017-09-30 DIAGNOSIS — IMO0001 Reserved for inherently not codable concepts without codable children: Secondary | ICD-10-CM

## 2017-09-30 MED ORDER — METOPROLOL SUCCINATE ER 50 MG PO TB24: 50.0000 mg | ORAL_TABLET | Freq: Every day | ORAL | 0 refills | Status: DC

## 2017-10-03 MED FILL — LEVEMIR FLEXTOUCH 100 UNITS: 100 | 30 days supply | Qty: 15 | Fill #2

## 2017-10-03 MED FILL — TRULICITY 1.5 MG/0.5 ML PEN: 1.5 | 28 days supply | Qty: 2 | Fill #2

## 2017-10-04 ENCOUNTER — Encounter: Payer: Self-pay | Admitting: Family Medicine

## 2017-10-04 ENCOUNTER — Ambulatory Visit: Payer: BLUE CROSS/BLUE SHIELD | Admitting: Family Medicine

## 2017-10-04 DIAGNOSIS — G8929 Other chronic pain: Secondary | ICD-10-CM | POA: Diagnosis not present

## 2017-10-04 DIAGNOSIS — I10 Essential (primary) hypertension: Secondary | ICD-10-CM

## 2017-10-04 DIAGNOSIS — E1165 Type 2 diabetes mellitus with hyperglycemia: Secondary | ICD-10-CM

## 2017-10-04 DIAGNOSIS — E782 Mixed hyperlipidemia: Secondary | ICD-10-CM

## 2017-10-04 DIAGNOSIS — IMO0001 Reserved for inherently not codable concepts without codable children: Secondary | ICD-10-CM

## 2017-10-04 DIAGNOSIS — M545 Low back pain: Secondary | ICD-10-CM

## 2017-10-04 LAB — COMPREHENSIVE METABOLIC PANEL
ALK PHOS: 49 U/L (ref 39–117)
ALT: 55 U/L — ABNORMAL HIGH (ref 0–53)
AST: 48 U/L — AB (ref 0–37)
Albumin: 4.6 g/dL (ref 3.5–5.2)
BUN: 14 mg/dL (ref 6–23)
CHLORIDE: 106 meq/L (ref 96–112)
CO2: 24 mEq/L (ref 19–32)
CREATININE: 0.86 mg/dL (ref 0.40–1.50)
Calcium: 9.7 mg/dL (ref 8.4–10.5)
GFR: 98.88 mL/min (ref >60.00)
GLUCOSE: 168 mg/dL — AB (ref 70–99)
POTASSIUM: 4.1 meq/L (ref 3.5–5.1)
SODIUM: 138 meq/L (ref 135–145)
TOTAL PROTEIN: 7.5 g/dL (ref 6.0–8.3)
Total Bilirubin: 0.8 mg/dL (ref 0.2–1.2)

## 2017-10-04 LAB — LIPID PANEL
CHOL/HDL RATIO: 3
Cholesterol: 124 mg/dL (ref 0–200)
HDL: 37.5 mg/dL — ABNORMAL LOW (ref >39.00)
LDL CALC: 63 mg/dL (ref 0–99)
NONHDL: 86.21
TRIGLYCERIDES: 114 mg/dL (ref 0.0–149.0)
VLDL: 22.8 mg/dL (ref 0.0–40.0)

## 2017-10-04 LAB — HEMOGLOBIN A1C: Hgb A1c MFr Bld: 7.8 % — ABNORMAL HIGH (ref 4.6–6.5)

## 2017-10-04 LAB — CBC
HEMATOCRIT: 39.2 % (ref 39.0–52.0)
Hemoglobin: 13.8 g/dL (ref 13.0–17.0)
MCHC: 35.2 g/dL (ref 30.0–36.0)
MCV: 95.3 fl (ref 78.0–100.0)
Platelets: 213 10*3/uL (ref 150.0–400.0)
RBC: 4.11 Mil/uL — AB (ref 4.22–5.81)
RDW: 14.5 % (ref 11.5–15.5)
WBC: 4.2 10*3/uL (ref 4.0–10.5)

## 2017-10-04 LAB — TSH: TSH: 2.33 u[IU]/mL (ref 0.35–4.50)

## 2017-10-04 MED ORDER — TRAMADOL HCL 50 MG PO TABS: 50.0000 mg | ORAL_TABLET | Freq: Three times a day (TID) | ORAL | 0 refills | Status: DC | PRN

## 2017-10-04 NOTE — Assessment & Plan Note (Addendum)
hgba1c acceptable, minimize simple carbs. Increase exercise as tolerated. Is improving.

## 2017-10-04 NOTE — Assessment & Plan Note (Signed)
Well controlled, no changes to meds. Encouraged heart healthy diet such as the DASH diet and exercise as tolerated.  °

## 2017-10-04 NOTE — Progress Notes (Signed)
Subjective:  I acted as a Education administrator for Dr. Charlett Blake. Curtis Kennedy, Curtis Kennedy  Patient ID: Curtis Kennedy, male    DOB: [birth year], 51 y.o.   MRN: [id number]  No chief complaint on file.   HPI  Patient is in today for a 3 month follow up and is doing well. No recent febrile illness or hospitalizations. Is trying to stay active and maintain a heart healthy diet. No polyuris or polydipsia. Has some trouble with low back pain intermittently but gets adequate relief with Tramadol prn. Denies CP/palp/SOB/HA/congestion/fevers/GI or GU c/o. Taking meds as prescribed  Patient Care Team: Mosie Lukes, MD as PCP - General (Family Medicine) Renelda Loma, OD (Optometry)   Past Medical History:  Diagnosis Date  . Anemia 07/02/2017  . 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 Erlene Quan; [facility]--has been in remission since about 2009  . Diabetes mellitus without complication ([place]) 51 yrs old   type 2 (dx'd after long courses of prednisone for his UC)  . Erectile dysfunction   . Family history of tinea corporis 07/25/2012  . Hyperlipidemia   . Hypertension   . Insomnia   . Low back pain 08/07/2016  . Preventative health care 12/31/2016  . Right shoulder pain 12/31/2016  . Sinusitis 08/28/2015    Past Surgical History:  Procedure Laterality Date  . TRIGGER FINGER RELEASE  2011   b/l middle fingers    Family History  Problem Relation Age of Onset  . Diabetes Father   . Hyperlipidemia Father   . Hypertension Father   . Kidney disease Father        Kidney Failure  . Heart disease Father 38       MI  . Hypertension Sister   . Hypertension Brother   . Diabetes Paternal Grandfather   . Hypertension Brother   . Hyperlipidemia Brother   . Heart disease Brother        Valve Surgery ?  . Diabetes Brother   . Hypertension Brother   . ADD / ADHD Brother     Social History   Socioeconomic History  . Marital status: Married    Spouse name: Not on file  . Number of children: Not on file  .  Years of education: Not on file  . Highest education level: Not on file  Social Needs  . Financial resource strain: Not on file  . Food insecurity - worry: Not on file  . Food insecurity - inability: Not on file  . Transportation needs - medical: Not on file  . Transportation needs - non-medical: Not on file  Occupational History  . Not on file  Tobacco Use  . Smoking status: Never Smoker  . Smokeless tobacco: Never Used  Substance and Sexual Activity  . Alcohol use: No  . Drug use: No  . 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 Not on file  Social History Narrative   Married, 53 y/o girl.     Lives in [place], grew up in [place]--relocated to [facility] 2007.   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          Outpatient Medications Prior to Visit  Medication Sig Dispense Refill  . albuterol (PROVENTIL HFA;VENTOLIN HFA) 108 (90 Base) MCG/ACT inhaler Inhale 2 puffs into the lungs every  6 (six) hours as needed for wheezing or shortness of breath. 1 Inhaler 0  . azaTHIOprine (IMURAN) 50 MG tablet TAKE 4.5(FOUR AND ONE-HALF) TABLETS BY MOUTH DAILY 135 tablet 0  . busPIRone (BUSPAR) 5 MG tablet Take 5 mg by mouth 2 (two) times daily.    . fenofibrate 160 MG tablet Take 1 tablet (160 mg total) by mouth daily. 30 tablet 6  . fluticasone (FLONASE) 50 MCG/ACT nasal spray Place 2 sprays into both nostrils daily. 16 g 1  . fluticasone (FLOVENT HFA) 110 MCG/ACT inhaler Inhale 2 puffs into the lungs 2 (two) times daily. 1 Inhaler 2  . fluticasone (FLOVENT HFA) 110 MCG/ACT inhaler Inhale 2 puffs into the lungs 2 (two) times daily. 1 Inhaler 2  . glimepiride (AMARYL) 4 MG tablet Take 1 tablet (4 mg total) by mouth 2 (two) times daily. 60 tablet 6  . glucose blood (ACCU-CHEK AVIVA PLUS) test strip Use twice daily to check blood sugar.  DX E11.9  100 each 4  . Insulin Detemir (LEVEMIR FLEXTOUCH) 100 UNIT/ML Pen Inject 50 Units into the skin daily at 10 pm. 15 mL 2  . Insulin Pen Needle (BD PEN NEEDLE NANO U/F) 32G X 4 MM MISC USE ONE NEEDLE DAILY 100 each 0  . Lancets (ONETOUCH ULTRASOFT) lancets Use as directed twice daily to check blood sugar.  DX E11.9 100 each 6  . LEVEMIR FLEXTOUCH 100 UNIT/ML Pen INJECT 40 TO 50 UNITS DAILY UNDER THE SKIN 15 mL 2  . losartan (COZAAR) 50 MG tablet Take 1 tablet (50 mg total) by mouth daily. 30 tablet 6  . losartan (COZAAR) 50 MG tablet TAKE 1 TABLET BY MOUTH DAILY 30 tablet 0  . losartan (COZAAR) 50 MG tablet TAKE 1 TABLET BY MOUTH DAILY 30 tablet 0  . meloxicam (MOBIC) 15 MG tablet Take 15 mg by mouth daily.    . metFORMIN (GLUCOPHAGE) 1000 MG tablet Take 1 tablet (1,000 mg total) by mouth 2 (two) times daily with a meal. 180 tablet 6  . metoprolol succinate (TOPROL-XL) 50 MG 24 hr tablet Take 1 tablet (50 mg total) by mouth daily. Take with or immediately following a meal. 30 tablet 0  . olopatadine (PATANOL) 0.1 % ophthalmic solution Place 1 drop into both eyes 2 (two) times daily. 5 mL 3  . Pitavastatin Calcium (LIVALO) 4 MG TABS Take 1 tablet (4 mg total) by mouth daily. 30 tablet 6  . sildenafil (REVATIO) 20 MG tablet 1 tab po 2-5 tabs po as needed for sexual activity. 50 tablet 0  . tadalafil (CIALIS) 5 MG tablet Take 5 mg by mouth daily as needed.    Marland Kitchen tiZANidine (ZANAFLEX) 4 MG tablet Take 1 tablet (4 mg total) by mouth every 6 (six) hours as needed for muscle spasms. 30 tablet 0  . TRULICITY 1.5 WU/9.8JX SOPN INJECT 1.5 MG INTO THE SKIN ONCE A WEEK. 2 mL 3  . valACYclovir (VALTREX) 1000 MG tablet Take 1 tablet (1,000 mg total) by mouth 2 (two) times daily as needed. 30 tablet 6  . acetaminophen-codeine (TYLENOL #3) 300-30 MG tablet Take 1-2 tablets by mouth every 8 (eight) hours as needed for moderate pain. 30 tablet 0  . ALPRAZolam (XANAX) 0.5 MG tablet Take 1 tablet (0.5 mg total) by mouth  2 (two) times daily as needed for anxiety. 60 tablet 1  . balsalazide (COLAZAL) 750 MG capsule Take 3 capsules (2,250 mg total) by mouth 3 (three) times daily. 270 capsule 3  .  levocetirizine (XYZAL) 5 MG tablet TAKE 1 TABLET BY MOUTH EVERY EVENING 90 tablet 0  . traMADol (ULTRAM) 50 MG tablet Take 1 tablet (50 mg total) by mouth every 8 (eight) hours as needed. 60 tablet 0   No facility-administered medications prior to visit.     Allergies  Allergen Reactions  . Simvastatin     Myalgias    Review of Systems  Constitutional: Negative for fever and malaise/fatigue.  HENT: Negative for congestion.   Eyes: Negative for blurred vision.  Respiratory: Negative for shortness of breath.   Cardiovascular: Negative for chest pain, palpitations and leg swelling.  Gastrointestinal: Negative for abdominal pain, blood in stool and nausea.  Genitourinary: Negative for dysuria and frequency.  Musculoskeletal: Positive for back pain. Negative for falls.  Skin: Negative for rash.  Neurological: Negative for dizziness, loss of consciousness and headaches.  Endo/Heme/Allergies: Negative for environmental allergies.  Psychiatric/Behavioral: Negative for depression. The patient is not nervous/anxious.        Objective:    Physical Exam  Constitutional: He is oriented to person, place, and time. He appears well-developed and well-nourished. No distress.  HENT:  Head: Normocephalic and atraumatic.  Nose: Nose normal.  Eyes: Right eye exhibits no discharge. Left eye exhibits no discharge.  Neck: Normal range of motion. Neck supple.  Cardiovascular: Normal rate and regular rhythm.  No murmur heard. Pulmonary/Chest: Effort normal and breath sounds normal.  Abdominal: Soft. Bowel sounds are normal. There is no tenderness.  Musculoskeletal: He exhibits no edema.  Neurological: He is alert and oriented to person, place, and time.  Skin: Skin is warm and dry.  Psychiatric: He has a normal mood and  affect.  Nursing note and vitals reviewed.   BP 118/70 (BP Location: Left Arm, Patient Position: Sitting, Cuff Size: Normal)   Pulse 88   Temp 98.2 F (36.8 C) (Oral)   Resp 18   Wt 187 lb 9.6 oz (85.1 kg)   SpO2 98%   BMI 30.28 kg/m  Wt Readings from Last 3 Encounters:  10/04/17 187 lb 9.6 oz (85.1 kg)  07/02/17 185 lb 6.4 oz (84.1 kg)  04/02/17 188 lb 12.8 oz (85.6 kg)   BP Readings from Last 3 Encounters:  10/04/17 118/70  07/02/17 110/60  04/02/17 112/70     Immunization History  Administered Date(s) Administered  . Influenza Split 08/21/2012  . Influenza,inj,Quad PF,6+ Mos 08/07/2016  . Influenza-Unspecified 07/27/2015  . Tdap 04/02/2017    Health Maintenance  Topic Date Due  . COLONOSCOPY  10/13/2014  . OPHTHALMOLOGY EXAM  02/19/2017  . INFLUENZA VACCINE  05/15/2017  . PNEUMOCOCCAL POLYSACCHARIDE VACCINE (1) 03/15/2018 (Originally 10/13/1966)  . HEMOGLOBIN A1C  04/04/2018  . FOOT EXAM  07/18/2018  . TETANUS/TDAP  04/03/2027  . Hepatitis C Screening  Completed  . HIV Screening  Completed    Lab Results  Component Value Date   WBC 4.2 10/04/2017   HGB 13.8 10/04/2017   HCT 39.2 10/04/2017   PLT 213.0 10/04/2017   GLUCOSE 168 (H) 10/04/2017   CHOL 124 10/04/2017   TRIG 114.0 10/04/2017   HDL 37.50 (L) 10/04/2017   LDLCALC 63 10/04/2017   ALT 55 (H) 10/04/2017   AST 48 (H) 10/04/2017   NA 138 10/04/2017   K 4.1 10/04/2017   CL 106 10/04/2017   CREATININE 0.86 10/04/2017   BUN 14 10/04/2017   CO2 24 10/04/2017   TSH 2.33 10/04/2017   PSA 0.58 12/31/2016   HGBA1C 7.8 (H) 10/04/2017  MICROALBUR 7.3 (H) 11/24/2015    Lab Results  Component Value Date   TSH 2.33 10/04/2017   Lab Results  Component Value Date   WBC 4.2 10/04/2017   HGB 13.8 10/04/2017   HCT 39.2 10/04/2017   MCV 95.3 10/04/2017   PLT 213.0 10/04/2017   Lab Results  Component Value Date   NA 138 10/04/2017   K 4.1 10/04/2017   CO2 24 10/04/2017   GLUCOSE 168 (H)  10/04/2017   BUN 14 10/04/2017   CREATININE 0.86 10/04/2017   BILITOT 0.8 10/04/2017   ALKPHOS 49 10/04/2017   AST 48 (H) 10/04/2017   ALT 55 (H) 10/04/2017   PROT 7.5 10/04/2017   ALBUMIN 4.6 10/04/2017   CALCIUM 9.7 10/04/2017   GFR 98.88 10/04/2017   Lab Results  Component Value Date   CHOL 124 10/04/2017   Lab Results  Component Value Date   HDL 37.50 (L) 10/04/2017   Lab Results  Component Value Date   LDLCALC 63 10/04/2017   Lab Results  Component Value Date   TRIG 114.0 10/04/2017   Lab Results  Component Value Date   CHOLHDL 3 10/04/2017   Lab Results  Component Value Date   HGBA1C 7.8 (H) 10/04/2017         Assessment & Plan:   Problem List Items Addressed This Visit    Hypertension    Well controlled, no changes to meds. Encouraged heart healthy diet such as the DASH diet and exercise as tolerated.       Relevant Orders   CBC (Completed)   Comprehensive metabolic panel (Completed)   TSH (Completed)   Hyperlipidemia    Encouraged heart healthy diet, increase exercise, avoid trans fats, consider a krill oil cap daily      Relevant Orders   Lipid panel (Completed)   Uncontrolled type 2 diabetes mellitus without complication, without long-term current use of insulin (HCC)    hgba1c acceptable, minimize simple carbs. Increase exercise as tolerated. Is improving.        Relevant Orders   Hemoglobin A1c (Completed)   Low back pain    Encouraged moist heat and gentle stretching as tolerated. May try NSAIDs and prescription meds as directed and report if symptoms worsen or seek immediate care. Uses Tramadol prn infrequently with good results. No concerning side effects.       Relevant Medications   traMADol (ULTRAM) 50 MG tablet      I have discontinued Margit Banda Risio "Joe"'s ALPRAZolam and acetaminophen-codeine. I am also having him maintain his meloxicam, tadalafil, busPIRone, fluticasone, sildenafil, onetouch ultrasoft, glucose blood,  valACYclovir, fluticasone, albuterol, fluticasone, tiZANidine, olopatadine, losartan, metFORMIN, fenofibrate, Pitavastatin Calcium, glimepiride, losartan, Insulin Detemir, losartan, TRULICITY, LEVEMIR FLEXTOUCH, Insulin Pen Needle, azaTHIOprine, metoprolol succinate, and traMADol.  Meds ordered this encounter  Medications  . traMADol (ULTRAM) 50 MG tablet    Sig: Take 1 tablet (50 mg total) by mouth every 8 (eight) hours as needed.    Dispense:  60 tablet    Refill:  0    CMA served as scribe during this visit. History, Physical and Plan performed by medical provider. Documentation and orders reviewed and attested to.  Penni Homans, MD

## 2017-10-04 NOTE — Assessment & Plan Note (Signed)
Encouraged heart healthy diet, increase exercise, avoid trans fats, consider a krill oil cap daily 

## 2017-10-04 NOTE — Patient Instructions (Addendum)
Shingrix is the new shingles shots, 2 shots over 2-6 months here or pharmacy.  Hypertension Hypertension is another name for high blood pressure. High blood pressure forces your heart to work harder to pump blood. This can cause problems over time. There are two numbers in a blood pressure reading. There is a top number (systolic) over a bottom number (diastolic). It is best to have a blood pressure below 120/80. Healthy choices can help lower your blood pressure. You may need medicine to help lower your blood pressure if:  Your blood pressure cannot be lowered with healthy choices.  Your blood pressure is higher than 130/80.  Follow these instructions at home: Eating and drinking  If directed, follow the DASH eating plan. This diet includes: ? Filling half of your plate at each meal with fruits and vegetables. ? Filling one quarter of your plate at each meal with whole grains. Whole grains include whole wheat pasta, brown rice, and whole grain bread. ? Eating or drinking low-fat dairy products, such as skim milk or low-fat yogurt. ? Filling one quarter of your plate at each meal with low-fat (lean) proteins. Low-fat proteins include fish, skinless chicken, eggs, beans, and tofu. ? Avoiding fatty meat, cured and processed meat, or chicken with skin. ? Avoiding premade or processed food.  Eat less than 1,500 mg of salt (sodium) a day.  Limit alcohol use to no more than 1 drink a day for nonpregnant women and 2 drinks a day for men. One drink equals 12 oz of beer, 5 oz of wine, or 1 oz of hard liquor. Lifestyle  Work with your doctor to stay at a healthy weight or to lose weight. Ask your doctor what the best weight is for you.  Get at least 30 minutes of exercise that causes your heart to beat faster (aerobic exercise) most days of the week. This may include walking, swimming, or biking.  Get at least 30 minutes of exercise that strengthens your muscles (resistance exercise) at least 3  days a week. This may include lifting weights or pilates.  Do not use any products that contain nicotine or tobacco. This includes cigarettes and e-cigarettes. If you need help quitting, ask your doctor.  Check your blood pressure at home as told by your doctor.  Keep all follow-up visits as told by your doctor. This is important. Medicines  Take over-the-counter and prescription medicines only as told by your doctor. Follow directions carefully.  Do not skip doses of blood pressure medicine. The medicine does not work as well if you skip doses. Skipping doses also puts you at risk for problems.  Ask your doctor about side effects or reactions to medicines that you should watch for. Contact a doctor if:  You think you are having a reaction to the medicine you are taking.  You have headaches that keep coming back (recurring).  You feel dizzy.  You have swelling in your ankles.  You have trouble with your vision. Get help right away if:  You get a very bad headache.  You start to feel confused.  You feel weak or numb.  You feel faint.  You get very bad pain in your: ? Chest. ? Belly (abdomen).  You throw up (vomit) more than once.  You have trouble breathing. Summary  Hypertension is another name for high blood pressure.  Making healthy choices can help lower blood pressure. If your blood pressure cannot be controlled with healthy choices, you may need to take medicine.  This information is not intended to replace advice given to you by your health care provider. Make sure you discuss any questions you have with your health care provider. Document Released: 03/19/2008 Document Revised: 08/29/2016 Document Reviewed: 08/29/2016 Elsevier Interactive Patient Education  [employer].

## 2017-10-10 ENCOUNTER — Other Ambulatory Visit: Payer: Self-pay | Admitting: Family Medicine

## 2017-10-10 ENCOUNTER — Encounter: Payer: Self-pay | Admitting: Family Medicine

## 2017-10-10 DIAGNOSIS — E1165 Type 2 diabetes mellitus with hyperglycemia: Secondary | ICD-10-CM

## 2017-10-10 DIAGNOSIS — E785 Hyperlipidemia, unspecified: Secondary | ICD-10-CM

## 2017-10-10 DIAGNOSIS — I10 Essential (primary) hypertension: Secondary | ICD-10-CM

## 2017-10-10 DIAGNOSIS — IMO0001 Reserved for inherently not codable concepts without codable children: Secondary | ICD-10-CM

## 2017-10-11 NOTE — Assessment & Plan Note (Signed)
Encouraged moist heat and gentle stretching as tolerated. May try NSAIDs and prescription meds as directed and report if symptoms worsen or seek immediate care. Uses Tramadol prn infrequently with good results. No concerning side effects.

## 2017-11-04 MED FILL — TRULICITY 1.5 MG/0.5 ML PEN: 1.5 | 28 days supply | Qty: 2 | Fill #3

## 2017-11-06 ENCOUNTER — Encounter: Payer: Self-pay | Admitting: Family Medicine

## 2017-11-06 MED ORDER — INSULIN DETEMIR 100 UNIT/ML FLEXPEN: 56.0000 [IU] | PEN_INJECTOR | Freq: Every day | SUBCUTANEOUS | 2 refills | Status: DC

## 2017-11-06 NOTE — Telephone Encounter (Signed)
Levemir Rx sent to [facility] in error. Spoke with pharmacist and cancelled rx. Re-sent to [facility] and message sent to pt.

## 2017-11-11 ENCOUNTER — Other Ambulatory Visit: Payer: Self-pay | Admitting: Family Medicine

## 2017-11-11 DIAGNOSIS — IMO0001 Reserved for inherently not codable concepts without codable children: Secondary | ICD-10-CM

## 2017-11-11 DIAGNOSIS — E7849 Other hyperlipidemia: Secondary | ICD-10-CM

## 2017-11-11 DIAGNOSIS — I10 Essential (primary) hypertension: Secondary | ICD-10-CM

## 2017-11-11 DIAGNOSIS — E1165 Type 2 diabetes mellitus with hyperglycemia: Secondary | ICD-10-CM

## 2017-11-11 DIAGNOSIS — E785 Hyperlipidemia, unspecified: Secondary | ICD-10-CM

## 2017-11-11 MED FILL — LEVEMIR FLEXTOUCH 100 UNITS: 100 | 54 days supply | Qty: 30 | Fill #0

## 2017-11-26 ENCOUNTER — Encounter: Payer: Self-pay | Admitting: Medical

## 2017-11-26 ENCOUNTER — Ambulatory Visit: Payer: BLUE CROSS/BLUE SHIELD | Admitting: Medical

## 2017-11-26 ENCOUNTER — Telehealth: Payer: Self-pay | Admitting: Medical

## 2017-11-26 VITALS — BP 121/77 | HR 77 | Temp 98.3°F | Resp 16 | Ht 66.0 in | Wt 185.8 lb

## 2017-11-26 DIAGNOSIS — R05 Cough: Secondary | ICD-10-CM

## 2017-11-26 DIAGNOSIS — J40 Bronchitis, not specified as acute or chronic: Secondary | ICD-10-CM

## 2017-11-26 DIAGNOSIS — J3489 Other specified disorders of nose and nasal sinuses: Secondary | ICD-10-CM | POA: Diagnosis not present

## 2017-11-26 DIAGNOSIS — R059 Cough, unspecified: Secondary | ICD-10-CM

## 2017-11-26 MED ORDER — AZITHROMYCIN 250 MG PO TABS: ORAL_TABLET | ORAL | 0 refills | Status: DC

## 2017-11-26 MED ORDER — GUAIFENESIN-CODEINE 100-10 MG/5ML PO SOLN: 5.0000 mL | Freq: Four times a day (QID) | ORAL | 0 refills | Status: DC | PRN

## 2017-11-26 NOTE — Progress Notes (Signed)
Subjective:    Patient ID: Curtis Kennedy, male    DOB: 02-Nov-1965, 52 y.o.   MRN: [id number]  HPI  Pt in with 2 weeks of cough. Random cough that is intermittent. Sometimes cough worse at night. Mild faint nasal congestion/sinus pressure  in the am. Pt never smoked in past.   He is getting up little mucus at times. Other time cough is dry.   Pt has report also of swollen area where he injects his insulin. Gets bruised area. He is using smallest needle. Swollen lumps that last 1-3 days. Pt getting frustrated with this and might want to see endocrinologist. Pt has been using levamir for 8 months or longer.  Most recent a1c one month ago was 7.8   Review of Systems  Constitutional: Negative for chills, fatigue and fever.  HENT: Positive for congestion, sinus pressure and sinus pain. Negative for ear discharge, ear pain, facial swelling, hearing loss and mouth sores.   Respiratory: Positive for cough. Negative for chest tightness, shortness of breath and wheezing.   Cardiovascular: Negative for chest pain and palpitations.  Gastrointestinal: Negative for abdominal pain.  Musculoskeletal: Negative for back pain, gait problem, joint swelling and neck stiffness.  Skin: Negative for rash.       See hpi.  Neurological: Negative for dizziness, speech difficulty, weakness, light-headedness, numbness and headaches.  Hematological: Negative for adenopathy. Does not bruise/bleed easily.  Psychiatric/Behavioral: Negative for behavioral problems, confusion and dysphoric mood. The patient is not nervous/anxious.     Past Medical History:  Diagnosis Date  . Anemia 07/02/2017  . Colitis, ulcerative ([place]) Dx'd 2001   Dr. Erlene Quan; [facility]--has been in remission since about 2009  . Diabetes mellitus without complication ([place]) 52 yrs old   type 2 (dx'd after long courses of prednisone for his UC)  . Erectile dysfunction   . Family history of tinea corporis 07/25/2012  . Hyperlipidemia   .  Hypertension   . Insomnia   . Low back pain 08/07/2016  . Preventative health care 12/31/2016  . Right shoulder pain 12/31/2016  . Sinusitis 08/28/2015     Social History   Socioeconomic History  . Marital status: Married    Spouse name: Not on file  . Number of children: Not on file  . Years of education: Not on file  . Highest education level: Not on file  Social Needs  . Financial resource strain: Not on file  . Food insecurity - worry: Not on file  . Food insecurity - inability: Not on file  . Transportation needs - medical: Not on file  . Transportation needs - non-medical: Not on file  Occupational History  . Not on file  Tobacco Use  . Smoking status: Never Smoker  . Smokeless tobacco: Never Used  Substance and Sexual Activity  . Alcohol use: No  . Drug use: No  . 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 Not on file  Social History Narrative   Married, 54 y/o girl.     Lives in [place], grew up in [place]--relocated to [facility] [place].   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          Past Surgical History:  Procedure Laterality Date  . TRIGGER FINGER RELEASE  2011   b/l middle fingers  Family History  Problem Relation Age of Onset  . Diabetes Father   . Hyperlipidemia Father   . Hypertension Father   . Kidney disease Father        Kidney Failure  . Heart disease Father 36       MI  . Hypertension Sister   . Hypertension Brother   . Diabetes Paternal Grandfather   . Hypertension Brother   . Hyperlipidemia Brother   . Heart disease Brother        Valve Surgery ?  . Diabetes Brother   . Hypertension Brother   . ADD / ADHD Brother     Allergies  Allergen Reactions  . Simvastatin     Myalgias    Current Outpatient Medications on File Prior to Visit  Medication Sig Dispense Refill  .  albuterol (PROVENTIL HFA;VENTOLIN HFA) 108 (90 Base) MCG/ACT inhaler Inhale 2 puffs into the lungs every 6 (six) hours as needed for wheezing or shortness of breath. 1 Inhaler 0  . azaTHIOprine (IMURAN) 50 MG tablet TAKE 4 AND 1/2 TABLETS BY MOUTH DAILY 135 tablet 0  . balsalazide (COLAZAL) 750 MG capsule Take 3 capsules (2,250 mg total) by mouth 3 (three) times daily. 270 capsule 3  . busPIRone (BUSPAR) 5 MG tablet Take 5 mg by mouth 2 (two) times daily.    . fenofibrate 160 MG tablet TAKE 1 TABLET BY MOUTH DAILY 30 tablet 0  . fluticasone (FLONASE) 50 MCG/ACT nasal spray Place 2 sprays into both nostrils daily. 16 g 1  . fluticasone (FLOVENT HFA) 110 MCG/ACT inhaler Inhale 2 puffs into the lungs 2 (two) times daily. 1 Inhaler 2  . fluticasone (FLOVENT HFA) 110 MCG/ACT inhaler Inhale 2 puffs into the lungs 2 (two) times daily. 1 Inhaler 2  . glimepiride (AMARYL) 4 MG tablet TAKE 1 TABLET BY MOUTH TWICE DAILY 60 tablet 0  . glucose blood (ACCU-CHEK AVIVA PLUS) test strip Use twice daily to check blood sugar.  DX E11.9 100 each 4  . Insulin Detemir (LEVEMIR FLEXTOUCH) 100 UNIT/ML Pen Inject 56 Units into the skin daily at 10 pm. 30 mL 2  . Insulin Pen Needle (BD PEN NEEDLE NANO U/F) 32G X 4 MM MISC USE ONE NEEDLE DAILY 100 each 0  . Lancets (ONETOUCH ULTRASOFT) lancets Use as directed twice daily to check blood sugar.  DX E11.9 100 each 6  . levocetirizine (XYZAL) 5 MG tablet Take 1 tablet (5 mg total) by mouth every evening. 90 tablet 3  . losartan (COZAAR) 50 MG tablet Take 1 tablet (50 mg total) by mouth daily. 30 tablet 6  . losartan (COZAAR) 50 MG tablet TAKE 1 TABLET BY MOUTH DAILY 30 tablet 0  . losartan (COZAAR) 50 MG tablet TAKE 1 TABLET BY MOUTH DAILY 30 tablet 0  . meloxicam (MOBIC) 15 MG tablet Take 15 mg by mouth daily.    . metFORMIN (GLUCOPHAGE) 1000 MG tablet Take 1 tablet (1,000 mg total) by mouth 2 (two) times daily with a meal. 180 tablet 6  . metoprolol succinate (TOPROL-XL) 50  MG 24 hr tablet Take 1 tablet (50 mg total) by mouth daily. Take with or immediately following a meal. 30 tablet 0  . olopatadine (PATANOL) 0.1 % ophthalmic solution Place 1 drop into both eyes 2 (two) times daily. 5 mL 3  . Pitavastatin Calcium (LIVALO) 4 MG TABS Take 1 tablet (4 mg total) by mouth daily. 30 tablet 6  . sildenafil (REVATIO) 20 MG tablet 1 tab  po 2-5 tabs po as needed for sexual activity. 50 tablet 0  . tadalafil (CIALIS) 5 MG tablet Take 5 mg by mouth daily as needed.    Marland Kitchen tiZANidine (ZANAFLEX) 4 MG tablet Take 1 tablet (4 mg total) by mouth every 6 (six) hours as needed for muscle spasms. 30 tablet 0  . traMADol (ULTRAM) 50 MG tablet Take 1 tablet (50 mg total) by mouth every 8 (eight) hours as needed. 60 tablet 0  . TRULICITY 1.5 AS/5.0NL SOPN INJECT 1.5 MG INTO THE SKIN ONCE A WEEK. 2 mL 3  . valACYclovir (VALTREX) 1000 MG tablet Take 1 tablet (1,000 mg total) by mouth 2 (two) times daily as needed. 30 tablet 6   No current facility-administered medications on file prior to visit.     BP 121/77   Pulse 77   Temp 98.3 F (36.8 C) (Oral)   Resp 16   Ht 5' 6"  (1.676 m)   Wt 185 lb 12.8 oz (84.3 kg)   SpO2 100%   BMI 29.99 kg/m       Objective:   Physical Exam  General  Mental Status - Alert. General Appearance - Well groomed. Not in acute distress.  Skin Rashes- No Rashes. He points out small swollen area of skin at levimir injection site rt lateral thigh. The area is not red, warm or tender.  For HEENT Head- Normal. Ear Auditory Canal - Left- Normal. Right - Normal.Tympanic Membrane- Left- Normal. Right- Normal. Eye Sclera/Conjunctiva- Left- Normal. Right- Normal. Nose & Sinuses Nasal Mucosa- Left-  Boggy and Congested. Right-  Boggy and  Congested.Bilateral maxillary and frontal sinus pressure. Mouth & Throat Lips: Upper Lip- Normal: no dryness, cracking, pallor, cyanosis, or vesicular eruption. Lower Lip-Normal: no dryness, cracking, pallor, cyanosis or  vesicular eruption. Buccal Mucosa- Bilateral- No Aphthous ulcers. Oropharynx- No Discharge or Erythema. Tonsils: Characteristics- Bilateral- No Erythema or Congestion. Size/Enlargement- Bilateral- No enlargement. Discharge- bilateral-None.  Neck Neck- Supple. No Masses.   Chest and Lung Exam Auscultation: Breath Sounds:-Clear even and unlabored.  Cardiovascular Auscultation:Rythm- Regular, rate and rhythm. Murmurs & Other Heart Sounds:Ausculatation of the heart reveal- No Murmurs.  Lymphatic Head & Neck General Head & Neck Lymphatics: Bilateral: Description- No Localized lymphadenopathy.        Assessment & Plan:  For your recent mild sinus pressure and intermittent minimal productive cough, I am prescribing guaifenesin with codeine, azithromycin antibiotic and recommend you use Flonase nasal spray.  You might have early sinus infection and bronchitis.  If your cough is persisting past another week then would recommend getting chest x-Kennedy.  Please update [place] if she has a lingering cough.  Regarding your frustration with daily injection of Levemir and swollen injection sites, I am going to update Dr. Charlett Blake and see if she is okay with the endocrinologist.  You mentioned you would be willing to consider insulin pump.  If she okays referral I will place the referral.  Follow-up in 10-14 days or as needed.

## 2017-11-26 NOTE — Telephone Encounter (Signed)
Dr. Charlett Blake,  I saw Curtis Kennedy for sinus infectious type symptoms and bronchitis today.  He did mention during the exam that he is frustrated with small bumps at his Levemir injection sites.  The size of swollen areas very but will last for 1-3 days.  These are starting to show up over the last 2-3 months.  I think he mentioned he was on insulin for about 8 months.  He is expressing frustration about these and he expresses interest in getting insulin pump and endocrinology referral.  I saw his most recent injection site and there was very minimal swelling.  Did not seem like nodule nor was there infection.  Just wanted to mention this and if you are okay with the referral then I will place that.  Thanks, Percell Miller

## 2017-11-26 NOTE — Telephone Encounter (Signed)
Thanks, endocrinology consult for consideration of a pump is a good plan, go ahead and set him up.

## 2017-11-26 NOTE — Patient Instructions (Signed)
For your recent mild sinus pressure and intermittent minimal productive cough, I am prescribing guaifenesin with codeine, azithromycin antibiotic and recommend you use Flonase nasal spray.  You might have early sinus infection and bronchitis.  If your cough is persisting past another week then would recommend getting chest x-ray.  Please update [place] if she has a lingering cough.  Regarding your frustration with daily injection of Levemir and swollen injection sites, I am going to update Dr. Charlett Blake and see if she is okay with the endocrinologist.  You mentioned you would be willing to consider insulin pump.  If she okays referral I will place the referral.  Follow-up in 10-14 days or as needed.

## 2017-11-27 ENCOUNTER — Telehealth: Payer: Self-pay | Admitting: Medical

## 2017-11-27 DIAGNOSIS — E119 Type 2 diabetes mellitus without complications: Secondary | ICD-10-CM

## 2017-11-27 NOTE — Telephone Encounter (Signed)
Referral to endocrinologist placed.

## 2017-12-02 ENCOUNTER — Other Ambulatory Visit: Payer: Self-pay | Admitting: Family Medicine

## 2017-12-02 MED FILL — TRULICITY 1.5 MG/0.5 ML PEN: 1.5 | 28 days supply | Qty: 2 | Fill #0

## 2017-12-11 ENCOUNTER — Other Ambulatory Visit: Payer: Self-pay | Admitting: Family Medicine

## 2017-12-11 DIAGNOSIS — E1165 Type 2 diabetes mellitus with hyperglycemia: Secondary | ICD-10-CM

## 2017-12-11 DIAGNOSIS — E7849 Other hyperlipidemia: Secondary | ICD-10-CM

## 2017-12-11 DIAGNOSIS — E785 Hyperlipidemia, unspecified: Secondary | ICD-10-CM

## 2017-12-11 DIAGNOSIS — IMO0001 Reserved for inherently not codable concepts without codable children: Secondary | ICD-10-CM

## 2017-12-11 DIAGNOSIS — I10 Essential (primary) hypertension: Secondary | ICD-10-CM

## 2017-12-12 ENCOUNTER — Encounter: Payer: Self-pay | Admitting: Family Medicine

## 2017-12-25 MED FILL — LEVEMIR FLEXTOUCH 100 UNITS: 100 | 54 days supply | Qty: 30 | Fill #1

## 2017-12-25 MED FILL — TRULICITY 1.5 MG/0.5 ML PEN: 1.5 | 28 days supply | Qty: 2 | Fill #1

## 2018-01-02 ENCOUNTER — Ambulatory Visit: Payer: BLUE CROSS/BLUE SHIELD | Admitting: Family Medicine

## 2018-01-02 VITALS — BP 122/72 | HR 76 | Temp 98.0°F | Resp 18 | Wt 187.4 lb

## 2018-01-02 DIAGNOSIS — K760 Fatty (change of) liver, not elsewhere classified: Secondary | ICD-10-CM | POA: Diagnosis not present

## 2018-01-02 DIAGNOSIS — E7849 Other hyperlipidemia: Secondary | ICD-10-CM | POA: Diagnosis not present

## 2018-01-02 DIAGNOSIS — E1165 Type 2 diabetes mellitus with hyperglycemia: Secondary | ICD-10-CM | POA: Diagnosis not present

## 2018-01-02 DIAGNOSIS — Z79899 Other long term (current) drug therapy: Secondary | ICD-10-CM | POA: Diagnosis not present

## 2018-01-02 DIAGNOSIS — Z23 Encounter for immunization: Secondary | ICD-10-CM | POA: Diagnosis not present

## 2018-01-02 DIAGNOSIS — E785 Hyperlipidemia, unspecified: Secondary | ICD-10-CM | POA: Diagnosis not present

## 2018-01-02 DIAGNOSIS — I10 Essential (primary) hypertension: Secondary | ICD-10-CM | POA: Diagnosis not present

## 2018-01-02 DIAGNOSIS — R1011 Right upper quadrant pain: Secondary | ICD-10-CM

## 2018-01-02 DIAGNOSIS — IMO0001 Reserved for inherently not codable concepts without codable children: Secondary | ICD-10-CM

## 2018-01-02 LAB — COMPREHENSIVE METABOLIC PANEL
ALK PHOS: 38 U/L — AB (ref 39–117)
ALT: 37 U/L (ref 0–53)
AST: 43 U/L — AB (ref 0–37)
Albumin: 4.7 g/dL (ref 3.5–5.2)
BILIRUBIN TOTAL: 0.6 mg/dL (ref 0.2–1.2)
BUN: 11 mg/dL (ref 6–23)
CO2: 25 meq/L (ref 19–32)
Calcium: 9.7 mg/dL (ref 8.4–10.5)
Chloride: 105 mEq/L (ref 96–112)
Creatinine, Ser: 0.89 mg/dL (ref 0.40–1.50)
GFR: 94.96 mL/min (ref >60.00)
Glucose, Bld: 94 mg/dL (ref 70–99)
Potassium: 4 mEq/L (ref 3.5–5.1)
SODIUM: 138 meq/L (ref 135–145)
TOTAL PROTEIN: 7.3 g/dL (ref 6.0–8.3)

## 2018-01-02 LAB — LIPID PANEL
CHOL/HDL RATIO: 3
Cholesterol: 120 mg/dL (ref 0–200)
HDL: 41.8 mg/dL (ref >39.00)
LDL Cholesterol: 67 mg/dL (ref 0–99)
NONHDL: 78.45
Triglycerides: 55 mg/dL (ref 0.0–149.0)
VLDL: 11 mg/dL (ref 0.0–40.0)

## 2018-01-02 LAB — CBC WITH DIFFERENTIAL/PLATELET
BASOS ABS: 0 10*3/uL (ref 0.0–0.1)
Basophils Relative: 0.4 % (ref 0.0–3.0)
EOS ABS: 0.1 10*3/uL (ref 0.0–0.7)
Eosinophils Relative: 1.6 % (ref 0.0–5.0)
HCT: 38.4 % — ABNORMAL LOW (ref 39.0–52.0)
Hemoglobin: 13.5 g/dL (ref 13.0–17.0)
LYMPHS ABS: 0.8 10*3/uL (ref 0.7–4.0)
Lymphocytes Relative: 23.5 % (ref 12.0–46.0)
MCHC: 35.3 g/dL (ref 30.0–36.0)
MCV: 93 fl (ref 78.0–100.0)
MONO ABS: 0.3 10*3/uL (ref 0.1–1.0)
Monocytes Relative: 9.3 % (ref 3.0–12.0)
NEUTROS ABS: 2.3 10*3/uL (ref 1.4–7.7)
NEUTROS PCT: 65.2 % (ref 43.0–77.0)
PLATELETS: 222 10*3/uL (ref 150.0–400.0)
RBC: 4.12 Mil/uL — AB (ref 4.22–5.81)
RDW: 14.9 % (ref 11.5–15.5)
WBC: 3.5 10*3/uL — ABNORMAL LOW (ref 4.0–10.5)

## 2018-01-02 LAB — HEMOGLOBIN A1C: HEMOGLOBIN A1C: 7.7 % — AB (ref 4.6–6.5)

## 2018-01-02 LAB — TSH: TSH: 1.75 u[IU]/mL (ref 0.35–4.50)

## 2018-01-02 MED ORDER — GLIMEPIRIDE 4 MG PO TABS: 4.0000 mg | ORAL_TABLET | Freq: Two times a day (BID) | ORAL | 0 refills | Status: DC

## 2018-01-02 MED ORDER — INSULIN DETEMIR 100 UNIT/ML FLEXPEN: 56.0000 [IU] | PEN_INJECTOR | Freq: Every day | SUBCUTANEOUS | 2 refills | Status: DC

## 2018-01-02 MED ORDER — DULAGLUTIDE 1.5 MG/0.5ML ~~LOC~~ SOAJ: SUBCUTANEOUS | 3 refills | Status: DC

## 2018-01-02 MED ORDER — METFORMIN HCL 1000 MG PO TABS: 1000.0000 mg | ORAL_TABLET | Freq: Two times a day (BID) | ORAL | 6 refills | Status: DC

## 2018-01-02 MED ORDER — METOPROLOL SUCCINATE ER 50 MG PO TB24: 50.0000 mg | ORAL_TABLET | Freq: Every day | ORAL | 0 refills | Status: DC

## 2018-01-02 MED ORDER — LOSARTAN POTASSIUM 50 MG PO TABS: 50.0000 mg | ORAL_TABLET | Freq: Every day | ORAL | 6 refills | Status: DC

## 2018-01-02 MED ORDER — LEVOCETIRIZINE DIHYDROCHLORIDE 5 MG PO TABS: 5.0000 mg | ORAL_TABLET | Freq: Every evening | ORAL | 3 refills | Status: DC

## 2018-01-02 NOTE — Progress Notes (Signed)
Subjective:  I acted as a Education administrator for Dr. Charlett Blake. [name], [place]  Patient ID: Curtis Kennedy, male    DOB: May 08, 1966, 52 y.o.   MRN: [id number]  No chief complaint on file.   HPI  Patient is in today for a 3 month follow up and overall he feels well. No recent febrile illness or hospitalizations.  He denies polyuria or polydipsia.  He is not routinely checking his blood sugars.  He has been trying to minimize his carbohydrates and eating out less.  He does continue to notice right upper quadrant pain after eating with some regularity.  No change in bowel habits, fevers or bloody or tarry stool. Denies CP/palp/SOB/HA/congestion/fevers or GU c/o. Taking meds as prescribed  Patient Care Team: Mosie Lukes, MD as PCP - General (Family Medicine) Renelda Loma, OD (Optometry)   Past Medical History:  Diagnosis Date  . Anemia 07/02/2017  . 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 Erlene Quan; [facility]--has been in remission since about 2009  . Diabetes mellitus without complication ([place]) 52 yrs old   type 2 (dx'd after long courses of prednisone for his UC)  . Erectile dysfunction   . Family history of tinea corporis 07/25/2012  . Hyperlipidemia   . Hypertension   . Insomnia   . Low back pain 08/07/2016  . Preventative health care 12/31/2016  . Right shoulder pain 12/31/2016  . Sinusitis 08/28/2015    Past Surgical History:  Procedure Laterality Date  . TRIGGER FINGER RELEASE  2011   b/l middle fingers    Family History  Problem Relation Age of Onset  . Diabetes Father   . Hyperlipidemia Father   . Hypertension Father   . Kidney disease Father        Kidney Failure  . Heart disease Father 49       MI  . Hypertension Sister   . Hypertension Brother   . Diabetes Paternal Grandfather   . Hypertension Brother   . Hyperlipidemia Brother   . Heart disease Brother        Valve Surgery ?  . Diabetes Brother   . Hypertension Brother   . ADD / ADHD Brother     Social  History   Socioeconomic History  . Marital status: Married    Spouse name: Not on file  . Number of children: Not on file  . Years of education: Not on file  . Highest education level: Not on file  Occupational History  . Not on file  Social Needs  . Financial resource strain: Not on file  . Food insecurity:    Worry: Not on file    Inability: Not on file  . Transportation needs:    Medical: Not on file    Non-medical: Not on file  Tobacco Use  . Smoking status: Never Smoker  . Smokeless tobacco: Never Used  Substance and Sexual Activity  . Alcohol use: No  . Drug use: No  . 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 Physical activity:    Days per week: Not on file    Minutes per session: Not on file  . Stress: Not on file  Relationships  . Social connections:    Talks on phone: Not on file    Gets together: Not on file    Attends religious service: Not on file    Active  member of club or organization: Not on file    Attends meetings of clubs or organizations: Not on file    Relationship status: Not on file  . Intimate partner violence:    Fear of current or ex partner: Not on file    Emotionally abused: Not on file    Physically abused: Not on file    Forced sexual activity: Not on file  Other Topics Concern  . Not on file  Social History Narrative   Married, 54 y/o girl.     Lives in [place], grew up in [place]--relocated to [facility] 2007.   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          Outpatient Medications Prior to Visit  Medication Sig Dispense Refill  . albuterol (PROVENTIL HFA;VENTOLIN HFA) 108 (90 Base) MCG/ACT inhaler Inhale 2 puffs into the lungs every 6 (six) hours as needed for wheezing or shortness of breath. 1 Inhaler 0  . azaTHIOprine (IMURAN) 50 MG tablet TAKE 4 AND 1/2 TABLETS BY MOUTH DAILY 135 tablet  0  . balsalazide (COLAZAL) 750 MG capsule Take 3 capsules (2,250 mg total) by mouth 3 (three) times daily. 270 capsule 3  . busPIRone (BUSPAR) 5 MG tablet Take 5 mg by mouth 2 (two) times daily.    . fenofibrate 160 MG tablet TAKE 1 TABLET BY MOUTH DAILY 30 tablet 0  . fluticasone (FLONASE) 50 MCG/ACT nasal spray Place 2 sprays into both nostrils daily. 16 g 1  . fluticasone (FLOVENT HFA) 110 MCG/ACT inhaler Inhale 2 puffs into the lungs 2 (two) times daily. 1 Inhaler 2  . fluticasone (FLOVENT HFA) 110 MCG/ACT inhaler Inhale 2 puffs into the lungs 2 (two) times daily. 1 Inhaler 2  . glucose blood (ACCU-CHEK AVIVA PLUS) test strip Use twice daily to check blood sugar.  DX E11.9 100 each 4  . guaiFENesin-codeine 100-10 MG/5ML syrup Take 5 mLs by mouth every 6 (six) hours as needed for cough. 120 mL 0  . Insulin Pen Needle (BD PEN NEEDLE NANO U/F) 32G X 4 MM MISC USE ONE NEEDLE DAILY 100 each 0  . Lancets (ONETOUCH ULTRASOFT) lancets Use as directed twice daily to check blood sugar.  DX E11.9 100 each 6  . meloxicam (MOBIC) 15 MG tablet Take 15 mg by mouth daily.    Marland Kitchen olopatadine (PATANOL) 0.1 % ophthalmic solution Place 1 drop into both eyes 2 (two) times daily. 5 mL 3  . Pitavastatin Calcium (LIVALO) 4 MG TABS Take 1 tablet (4 mg total) by mouth daily. 30 tablet 6  . sildenafil (REVATIO) 20 MG tablet 1 tab po 2-5 tabs po as needed for sexual activity. 50 tablet 0  . tadalafil (CIALIS) 5 MG tablet Take 5 mg by mouth daily as needed.    Marland Kitchen tiZANidine (ZANAFLEX) 4 MG tablet Take 1 tablet (4 mg total) by mouth every 6 (six) hours as needed for muscle spasms. 30 tablet 0  . traMADol (ULTRAM) 50 MG tablet Take 1 tablet (50 mg total) by mouth every 8 (eight) hours as needed. 60 tablet 0  . valACYclovir (VALTREX) 1000 MG tablet Take 1 tablet (1,000 mg total) by mouth 2 (two) times daily as needed. 30 tablet 6  . azithromycin (ZITHROMAX) 250 MG tablet Take 2 tablets by mouth on day 1, followed by 1 tablet by  mouth daily for 4 days. 6 tablet 0  . glimepiride (AMARYL) 4  MG tablet TAKE 1 TABLET BY MOUTH TWICE DAILY 60 tablet 0  . Insulin Detemir (LEVEMIR FLEXTOUCH) 100 UNIT/ML Pen Inject 56 Units into the skin daily at 10 pm. 30 mL 2  . levocetirizine (XYZAL) 5 MG tablet Take 1 tablet (5 mg total) by mouth every evening. 90 tablet 3  . losartan (COZAAR) 50 MG tablet Take 1 tablet (50 mg total) by mouth daily. 30 tablet 6  . losartan (COZAAR) 50 MG tablet TAKE 1 TABLET BY MOUTH DAILY 30 tablet 0  . losartan (COZAAR) 50 MG tablet TAKE 1 TABLET BY MOUTH DAILY 30 tablet 0  . metFORMIN (GLUCOPHAGE) 1000 MG tablet Take 1 tablet (1,000 mg total) by mouth 2 (two) times daily with a meal. 180 tablet 6  . metoprolol succinate (TOPROL-XL) 50 MG 24 hr tablet Take 1 tablet (50 mg total) by mouth daily. Take with or immediately following a meal. 30 tablet 0  . TRULICITY 1.5 HG/9.9ME SOPN INJECT 1.5 MG INTO THE SKIN ONCE A WEEK. 2 mL 3   No facility-administered medications prior to visit.     Allergies  Allergen Reactions  . Simvastatin     Myalgias    Review of Systems  Constitutional: Negative for fever and malaise/fatigue.  HENT: Negative for congestion.   Eyes: Negative for blurred vision.  Respiratory: Negative for shortness of breath.   Cardiovascular: Negative for chest pain, palpitations and leg swelling.  Gastrointestinal: Positive for abdominal pain. Negative for blood in stool and nausea.  Genitourinary: Negative for dysuria and frequency.  Musculoskeletal: Negative for falls.  Skin: Negative for rash.  Neurological: Negative for dizziness, loss of consciousness and headaches.  Endo/Heme/Allergies: Negative for environmental allergies.  Psychiatric/Behavioral: Negative for depression. The patient is not nervous/anxious.        Objective:    Physical Exam  Constitutional: He is oriented to person, place, and time. He appears well-developed and well-nourished. No distress.  HENT:  Head:  Normocephalic and atraumatic.  Nose: Nose normal.  Eyes: Right eye exhibits no discharge. Left eye exhibits no discharge.  Neck: Normal range of motion. Neck supple.  Cardiovascular: Normal rate and regular rhythm.  No murmur heard. Pulmonary/Chest: Effort normal and breath sounds normal.  Abdominal: Soft. Bowel sounds are normal. There is no tenderness.  Musculoskeletal: He exhibits no edema.  Neurological: He is alert and oriented to person, place, and time.  Skin: Skin is warm and dry.  Psychiatric: He has a normal mood and affect.  Nursing note and vitals reviewed.   BP 122/72 (BP Location: Left Arm, Patient Position: Sitting, Cuff Size: Normal)   Pulse 76   Temp 98 F (36.7 C) (Oral)   Resp 18   Wt 187 lb 6.4 oz (85 kg)   SpO2 98%   BMI 30.25 kg/m  Wt Readings from Last 3 Encounters:  01/02/18 187 lb 6.4 oz (85 kg)  11/26/17 185 lb 12.8 oz (84.3 kg)  10/04/17 187 lb 9.6 oz (85.1 kg)   BP Readings from Last 3 Encounters:  01/02/18 122/72  11/26/17 121/77  10/04/17 118/70     Immunization History  Administered Date(s) Administered  . Influenza Split 10/16/2011, 08/21/2012, 07/29/2014  . Influenza,inj,Quad PF,6+ Mos 08/07/2016  . Influenza-Unspecified 07/27/2015  . Pneumococcal Conjugate-13 10/16/2011  . Pneumococcal Polysaccharide-23 01/02/2018  . Tdap 04/02/2017    Health Maintenance  Topic Date Due  . COLONOSCOPY  10/13/2014  . OPHTHALMOLOGY EXAM  02/19/2017  . INFLUENZA VACCINE  05/15/2017  . PNEUMOCOCCAL POLYSACCHARIDE VACCINE (1)  03/15/2018 (Originally 10/13/1966)  . HEMOGLOBIN A1C  04/04/2018  . FOOT EXAM  07/18/2018  . TETANUS/TDAP  04/03/2027  . Hepatitis C Screening  Completed  . HIV Screening  Completed    Lab Results  Component Value Date   WBC 4.2 10/04/2017   HGB 13.8 10/04/2017   HCT 39.2 10/04/2017   PLT 213.0 10/04/2017   GLUCOSE 168 (H) 10/04/2017   CHOL 124 10/04/2017   TRIG 114.0 10/04/2017   HDL 37.50 (L) 10/04/2017   LDLCALC  63 10/04/2017   ALT 55 (H) 10/04/2017   AST 48 (H) 10/04/2017   NA 138 10/04/2017   K 4.1 10/04/2017   CL 106 10/04/2017   CREATININE 0.86 10/04/2017   BUN 14 10/04/2017   CO2 24 10/04/2017   TSH 2.33 10/04/2017   PSA 0.58 12/31/2016   HGBA1C 7.8 (H) 10/04/2017   MICROALBUR 7.3 (H) 11/24/2015    Lab Results  Component Value Date   TSH 2.33 10/04/2017   Lab Results  Component Value Date   WBC 4.2 10/04/2017   HGB 13.8 10/04/2017   HCT 39.2 10/04/2017   MCV 95.3 10/04/2017   PLT 213.0 10/04/2017   Lab Results  Component Value Date   NA 138 10/04/2017   K 4.1 10/04/2017   CO2 24 10/04/2017   GLUCOSE 168 (H) 10/04/2017   BUN 14 10/04/2017   CREATININE 0.86 10/04/2017   BILITOT 0.8 10/04/2017   ALKPHOS 49 10/04/2017   AST 48 (H) 10/04/2017   ALT 55 (H) 10/04/2017   PROT 7.5 10/04/2017   ALBUMIN 4.6 10/04/2017   CALCIUM 9.7 10/04/2017   GFR 98.88 10/04/2017   Lab Results  Component Value Date   CHOL 124 10/04/2017   Lab Results  Component Value Date   HDL 37.50 (L) 10/04/2017   Lab Results  Component Value Date   LDLCALC 63 10/04/2017   Lab Results  Component Value Date   TRIG 114.0 10/04/2017   Lab Results  Component Value Date   CHOLHDL 3 10/04/2017   Lab Results  Component Value Date   HGBA1C 7.8 (H) 10/04/2017         Assessment & Plan:   Problem List Items Addressed This Visit    Hypertension    Well controlled, no changes to meds. Encouraged heart healthy diet such as the DASH diet and exercise as tolerated.       Relevant Medications   losartan (COZAAR) 50 MG tablet   metoprolol succinate (TOPROL-XL) 50 MG 24 hr tablet   glimepiride (AMARYL) 4 MG tablet   Other Relevant Orders   CBC with Differential/Platelet   Comprehensive metabolic panel   TSH   Hyperlipidemia    Encouraged heart healthy diet, increase exercise, avoid trans fats, consider a krill oil cap daily      Relevant Medications   losartan (COZAAR) 50 MG tablet    metoprolol succinate (TOPROL-XL) 50 MG 24 hr tablet   glimepiride (AMARYL) 4 MG tablet   Other Relevant Orders   Lipid panel   Uncontrolled type 2 diabetes mellitus without complication, without long-term current use of insulin (HCC)    hgba1c improving, minimize simple carbs. Increase exercise as tolerated. Continue current meds. Encouraged to check sugars at least twice a week. Is having occasional episodes of low sugars in the middle of the night. Waking up shaky and anxious he feels better in 15 to 20 minutes after eating but he has not been checking his sugars when it happens is encouraged  to check sugars at that time and eat proteins at bedtime      Relevant Medications   losartan (COZAAR) 50 MG tablet   metoprolol succinate (TOPROL-XL) 50 MG 24 hr tablet   metFORMIN (GLUCOPHAGE) 1000 MG tablet   Dulaglutide (TRULICITY) 1.5 SW/5.4OE SOPN   glimepiride (AMARYL) 4 MG tablet   Insulin Detemir (LEVEMIR FLEXTOUCH) 100 UNIT/ML Pen   Other Relevant Orders   Hemoglobin A1c   Fatty liver disease, nonalcoholic    Minimize carbs and attempt modest weight loss. Does endorse pain in ruq after eating so needs to have his gallbladder assessed with a HIDA scan he prefers to go back to his old gastroenterologist but he forgets the name. He will let me know. So we can refer.       Other Visit Diagnoses    High risk medication use    -  Primary   Relevant Orders   Pain Mgmt, Profile 8 w/Conf, U   RUQ pain       Relevant Orders   CBC with Differential/Platelet   Need for 23-polyvalent pneumococcal polysaccharide vaccine       Relevant Orders   Pneumococcal polysaccharide vaccine 23-valent greater than or equal to 2yo subcutaneous/IM (Completed)      I have discontinued Margit Banda Risio "Joe"'s azithromycin. I have changed his TRULICITY to Dulaglutide. I have also changed his glimepiride. Additionally, I am having him maintain his meloxicam, tadalafil, busPIRone, fluticasone, sildenafil,  onetouch ultrasoft, glucose blood, valACYclovir, fluticasone, albuterol, fluticasone, tiZANidine, olopatadine, Pitavastatin Calcium, Insulin Pen Needle, traMADol, balsalazide, azaTHIOprine, guaiFENesin-codeine, fenofibrate, losartan, levocetirizine, metoprolol succinate, metFORMIN, and Insulin Detemir.  Meds ordered this encounter  Medications  . losartan (COZAAR) 50 MG tablet    Sig: Take 1 tablet (50 mg total) by mouth daily.    Dispense:  30 tablet    Refill:  6  . levocetirizine (XYZAL) 5 MG tablet    Sig: Take 1 tablet (5 mg total) by mouth every evening.    Dispense:  90 tablet    Refill:  3  . metoprolol succinate (TOPROL-XL) 50 MG 24 hr tablet    Sig: Take 1 tablet (50 mg total) by mouth daily. Take with or immediately following a meal.    Dispense:  30 tablet    Refill:  0  . metFORMIN (GLUCOPHAGE) 1000 MG tablet    Sig: Take 1 tablet (1,000 mg total) by mouth 2 (two) times daily with a meal.    Dispense:  180 tablet    Refill:  6  . Dulaglutide (TRULICITY) 1.5 VO/3.5KK SOPN    Sig: INJECT 1.5 MG INTO THE SKIN ONCE A WEEK.    Dispense:  2 mL    Refill:  3  . glimepiride (AMARYL) 4 MG tablet    Sig: Take 1 tablet (4 mg total) by mouth 2 (two) times daily.    Dispense:  60 tablet    Refill:  0  . Insulin Detemir (LEVEMIR FLEXTOUCH) 100 UNIT/ML Pen    Sig: Inject 56 Units into the skin daily at 10 pm.    Dispense:  30 mL    Refill:  2    CMA served as scribe during this visit. History, Physical and Plan performed by medical provider. Documentation and orders reviewed and attested to.  Penni Homans, MD

## 2018-01-02 NOTE — Assessment & Plan Note (Signed)
Encouraged heart healthy diet, increase exercise, avoid trans fats, consider a krill oil cap daily 

## 2018-01-02 NOTE — Assessment & Plan Note (Signed)
Well controlled, no changes to meds. Encouraged heart healthy diet such as the DASH diet and exercise as tolerated.  °

## 2018-01-02 NOTE — Patient Instructions (Signed)
Fatty Liver Fatty liver, also called hepatic steatosis or steatohepatitis, is a condition in which too much fat has built up in your liver cells. The liver removes harmful substances from your bloodstream. It produces fluids your body needs. It also helps your body use and store energy from the food you eat. In many cases, fatty liver does not cause symptoms or problems. It is often diagnosed when tests are being done for other reasons. However, over time, fatty liver can cause inflammation that may lead to more serious liver problems, such as scarring of the liver (cirrhosis). What are the causes? Causes of fatty liver may include:  Drinking too much alcohol.  Poor nutrition.  Obesity.  Cushing syndrome.  Diabetes.  Hyperlipidemia.  Pregnancy.  Certain drugs.  Poisons.  Some viral infections.  What increases the risk? You may be more likely to develop fatty liver if you:  Abuse alcohol.  Are pregnant.  Are overweight.  Have diabetes.  Have hepatitis.  Have a high triglyceride level.  What are the signs or symptoms? Fatty liver often does not cause any symptoms. In cases where symptoms develop, they can include:  Fatigue.  Weakness.  Weight loss.  Confusion.  Abdominal pain.  Yellowing of your skin and the white parts of your eyes (jaundice).  Nausea and vomiting.  How is this diagnosed? Fatty liver may be diagnosed by:  Physical exam and medical history.  Blood tests.  Imaging tests, such as an ultrasound, CT scan, or MRI.  Liver biopsy. A small sample of liver tissue is removed using a needle. The sample is then looked at under a microscope.  How is this treated? Fatty liver is often caused by other health conditions. Treatment for fatty liver may involve medicines and lifestyle changes to manage conditions such as:  Alcoholism.  High cholesterol.  Diabetes.  Being overweight or obese.  Follow these instructions at home:  Eat a  healthy diet as directed by your health care provider.  Exercise regularly. This can help you lose weight and control your cholesterol and diabetes. Talk to your health care provider about an exercise plan and which activities are best for you.  Do not drink alcohol.  Take medicines only as directed by your health care provider. Contact a health care provider if: You have difficulty controlling your:  Blood sugar.  Cholesterol.  Alcohol consumption.  Get help right away if:  You have abdominal pain.  You have jaundice.  You have nausea and vomiting. This information is not intended to replace advice given to you by your health care provider. Make sure you discuss any questions you have with your health care provider. Document Released: 11/16/2005 Document Revised: 03/08/2016 Document Reviewed: 02/10/2014 Elsevier Interactive Patient Education  [employer].

## 2018-01-02 NOTE — Assessment & Plan Note (Signed)
hgba1c improving, minimize simple carbs. Increase exercise as tolerated. Continue current meds. Encouraged to check sugars at least twice a week. Is having occasional episodes of low sugars in the middle of the night. Waking up shaky and anxious he feels better in 15 to 20 minutes after eating but he has not been checking his sugars when it happens is encouraged to check sugars at that time and eat proteins at bedtime

## 2018-01-02 NOTE — Assessment & Plan Note (Addendum)
Minimize carbs and attempt modest weight loss. Does endorse pain in ruq after eating so needs to have his gallbladder assessed with a HIDA scan he prefers to go back to his old gastroenterologist but he forgets the name. He will let me know. So we can refer.

## 2018-01-05 LAB — PAIN MGMT, PROFILE 8 W/CONF, U
6 ACETYLMORPHINE: NEGATIVE ng/mL (ref <10)
AMPHETAMINES: NEGATIVE ng/mL (ref <500)
Alcohol Metabolites: NEGATIVE ng/mL (ref <500)
BUPRENORPHINE, URINE: NEGATIVE ng/mL (ref <5)
Benzodiazepines: NEGATIVE ng/mL (ref <100)
Cocaine Metabolite: NEGATIVE ng/mL (ref <150)
Creatinine: 103.1 mg/dL
MDA: NEGATIVE ng/mL (ref <200)
MDMA: NEGATIVE ng/mL (ref <500)
MDMA: NEGATIVE ng/mL (ref <200)
Marijuana Metabolite: NEGATIVE ng/mL (ref <20)
OXIDANT: NEGATIVE ug/mL (ref <200)
OXYCODONE: NEGATIVE ng/mL (ref <100)
Opiates: NEGATIVE ng/mL (ref <100)
pH: 6.11 (ref 4.5–9.0)

## 2018-01-07 ENCOUNTER — Encounter: Payer: Self-pay | Admitting: Endocrinology

## 2018-01-07 ENCOUNTER — Ambulatory Visit: Payer: BLUE CROSS/BLUE SHIELD | Admitting: Endocrinology

## 2018-01-07 DIAGNOSIS — E1165 Type 2 diabetes mellitus with hyperglycemia: Secondary | ICD-10-CM | POA: Diagnosis not present

## 2018-01-07 DIAGNOSIS — I10 Essential (primary) hypertension: Secondary | ICD-10-CM | POA: Diagnosis not present

## 2018-01-07 DIAGNOSIS — E785 Hyperlipidemia, unspecified: Secondary | ICD-10-CM

## 2018-01-07 DIAGNOSIS — IMO0001 Reserved for inherently not codable concepts without codable children: Secondary | ICD-10-CM

## 2018-01-07 MED ORDER — INSULIN DETEMIR 100 UNIT/ML FLEXPEN: 60.0000 [IU] | PEN_INJECTOR | SUBCUTANEOUS | 2 refills | Status: DC

## 2018-01-07 MED ORDER — GLIMEPIRIDE 4 MG PO TABS: 4.0000 mg | ORAL_TABLET | ORAL | 5 refills | Status: DC

## 2018-01-07 NOTE — Patient Instructions (Addendum)
good diet and exercise significantly improve the control of your diabetes.  please let me know if you wish to be referred to a dietician.  high blood sugar is very risky to your health.  you should see an eye doctor and dentist every year.  It is very important to get all recommended vaccinations.  check your blood sugar once a day.  vary the time of day when you check, between before the 3 meals, and at bedtime.  also check if you have symptoms of your blood sugar being too high or too low.  please keep a record of the readings and bring it to your next appointment here (or you can bring the meter itself).  You can write it on any piece of paper.  please call us sooner if your blood sugar goes below 70, or if you have a lot of readings over 200.  Try injecting the injecting the insulin into your stomach. For now, please: Change the levemir to 60 units each morning, and:  reduce the glimepiride to 4 mg each morning, and: Please continue the same other diabetes medications. Please call or message [place] next week, to tell us how the blood sugar is doing.   Please come back for a follow-up appointment in 2 months.

## 2018-01-07 NOTE — Progress Notes (Signed)
Subjective:    Patient ID: Curtis Kennedy, male    DOB: [birth year], 52 y.o.   MRN: [id number]  HPI Pt returns for f/u of diabetes mellitus: DM type: Insulin-requiring type 2 Dx'ed: 5681 Complications: none Therapy: insulin since 2751, trulicity, and 2 oral meds.   DKA: never Severe hypoglycemia: never Pancreatitis: never Pancreatic imaging: normal on 2018 [place] Other: he seldom checks cbg's; he declines multiple daily injections. Interval history: no cbg record, but states he has frequent mild hypoglycemia in the middle of the night.   Past Medical History:  Diagnosis Date  . Anemia 07/02/2017  . Colitis, ulcerative ([place]) Dx'd 2001   Dr. Erlene Quan; [facility]--has been in remission since about 2009  . Diabetes mellitus without complication ([place]) 52 yrs old   type 2 (dx'd after long courses of prednisone for his UC)  . Erectile dysfunction   . Family history of tinea corporis 07/25/2012  . Hyperlipidemia   . Hypertension   . Insomnia   . Low back pain 08/07/2016  . Preventative health care 12/31/2016  . Right shoulder pain 12/31/2016  . Sinusitis 08/28/2015    Past Surgical History:  Procedure Laterality Date  . TRIGGER FINGER RELEASE  2011   b/l middle fingers    Social History   Socioeconomic History  . Marital status: Married    Spouse name: Not on file  . Number of children: Not on file  . Years of education: Not on file  . Highest education level: Not on file  Occupational History  . Not on file  Social Needs  . Financial resource strain: Not on file  . Food insecurity:    Worry: Not on file    Inability: Not on file  . Transportation needs:    Medical: Not on file    Non-medical: Not on file  Tobacco Use  . Smoking status: Never Smoker  . Smokeless tobacco: Never Used  Substance and Sexual Activity  . Alcohol use: No  . Drug use: No  . 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  Physical activity:    Days per week: Not on file    Minutes per session: Not on file  . Stress: Not on file  Relationships  . Social connections:    Talks on phone: Not on file    Gets together: Not on file    Attends religious service: Not on file    Active member of club or organization: Not on file    Attends meetings of clubs or organizations: Not on file    Relationship status: Not on file  . Intimate partner violence:    Fear of current or ex partner: Not on file    Emotionally abused: Not on file    Physically abused: Not on file    Forced sexual activity: Not on file  Other Topics Concern  . Not on file  Social History Narrative   Married, 54 y/o girl.     Lives in [place], grew up in [place]--relocated to [facility] 2007.   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          Current Outpatient Medications on File Prior to Visit  Medication Sig Dispense Refill  . albuterol (PROVENTIL HFA;VENTOLIN HFA) 108 (90 Base) MCG/ACT inhaler Inhale 2 puffs into the lungs  every 6 (six) hours as needed for wheezing or shortness of breath. 1 Inhaler 0  . azaTHIOprine (IMURAN) 50 MG tablet TAKE 4 AND 1/2 TABLETS BY MOUTH DAILY 135 tablet 0  . balsalazide (COLAZAL) 750 MG capsule Take 3 capsules (2,250 mg total) by mouth 3 (three) times daily. 270 capsule 3  . busPIRone (BUSPAR) 5 MG tablet Take 5 mg by mouth 2 (two) times daily.    . Dulaglutide (TRULICITY) 1.5 RJ/1.8AC SOPN INJECT 1.5 MG INTO THE SKIN ONCE A WEEK. 2 mL 3  . fenofibrate 160 MG tablet TAKE 1 TABLET BY MOUTH DAILY 30 tablet 0  . fluticasone (FLONASE) 50 MCG/ACT nasal spray Place 2 sprays into both nostrils daily. 16 g 1  . fluticasone (FLOVENT HFA) 110 MCG/ACT inhaler Inhale 2 puffs into the lungs 2 (two) times daily. 1 Inhaler 2  . fluticasone (FLOVENT HFA) 110 MCG/ACT inhaler Inhale 2 puffs into the lungs 2 (two) times daily. 1 Inhaler 2  . glucose blood  (ACCU-CHEK AVIVA PLUS) test strip Use twice daily to check blood sugar.  DX E11.9 100 each 4  . guaiFENesin-codeine 100-10 MG/5ML syrup Take 5 mLs by mouth every 6 (six) hours as needed for cough. 120 mL 0  . Insulin Pen Needle (BD PEN NEEDLE NANO U/F) 32G X 4 MM MISC USE ONE NEEDLE DAILY 100 each 0  . Lancets (ONETOUCH ULTRASOFT) lancets Use as directed twice daily to check blood sugar.  DX E11.9 100 each 6  . levocetirizine (XYZAL) 5 MG tablet Take 1 tablet (5 mg total) by mouth every evening. 90 tablet 3  . losartan (COZAAR) 50 MG tablet Take 1 tablet (50 mg total) by mouth daily. 30 tablet 6  . meloxicam (MOBIC) 15 MG tablet Take 15 mg by mouth daily.    . metFORMIN (GLUCOPHAGE) 1000 MG tablet Take 1 tablet (1,000 mg total) by mouth 2 (two) times daily with a meal. 180 tablet 6  . metoprolol succinate (TOPROL-XL) 50 MG 24 hr tablet Take 1 tablet (50 mg total) by mouth daily. Take with or immediately following a meal. 30 tablet 0  . olopatadine (PATANOL) 0.1 % ophthalmic solution Place 1 drop into both eyes 2 (two) times daily. 5 mL 3  . Pitavastatin Calcium (LIVALO) 4 MG TABS Take 1 tablet (4 mg total) by mouth daily. 30 tablet 6  . sildenafil (REVATIO) 20 MG tablet 1 tab po 2-5 tabs po as needed for sexual activity. 50 tablet 0  . tadalafil (CIALIS) 5 MG tablet Take 5 mg by mouth daily as needed.    Marland Kitchen tiZANidine (ZANAFLEX) 4 MG tablet Take 1 tablet (4 mg total) by mouth every 6 (six) hours as needed for muscle spasms. 30 tablet 0  . traMADol (ULTRAM) 50 MG tablet Take 1 tablet (50 mg total) by mouth every 8 (eight) hours as needed. 60 tablet 0  . valACYclovir (VALTREX) 1000 MG tablet Take 1 tablet (1,000 mg total) by mouth 2 (two) times daily as needed. 30 tablet 6   No current facility-administered medications on file prior to visit.     Allergies  Allergen Reactions  . Simvastatin     Myalgias    Family History  Problem Relation Age of Onset  . Diabetes Father   . Hyperlipidemia  Father   . Hypertension Father   . Kidney disease Father        Kidney Failure  . Heart disease Father 66       MI  .  Hypertension Sister   . Hypertension Brother   . Diabetes Paternal Grandfather   . Hypertension Brother   . Hyperlipidemia Brother   . Heart disease Brother        Valve Surgery ?  . Diabetes Brother   . Hypertension Brother   . ADD / ADHD Brother     BP (!) 142/80 (BP Location: Left Arm, Patient Position: Sitting, Cuff Size: Normal)   Pulse 89   Wt 186 lb 3.2 oz (84.5 kg)   SpO2 97%   BMI 30.05 kg/m    Review of Systems He has swelling at injection sites at the ant thighs, which is the only place he injects.      Objective:   Physical Exam VITAL SIGNS:  See vs page GENERAL: no distress Pulses: dorsalis pedis intact bilat.   MSK: no deformity of the feet CV: no leg edema Skin:  no ulcer on the feet.  normal color and temp on the feet. Neuro: sensation is intact to touch on the feet. SKIN:  Insulin injection sites at the anterior thighs are normal    Lab Results  Component Value Date   HGBA1C 7.7 (H) 01/02/2018    Lab Results  Component Value Date   ALT 37 01/02/2018   AST 43 (H) 01/02/2018   ALKPHOS 38 (L) 01/02/2018   BILITOT 0.6 01/02/2018       Assessment & Plan:  Insulin-requiring type 2 DM: we discussed.  Pt says he wants to minimize # of meds.   Inject site sxs, new to me.     Patient Instructions  good diet and exercise significantly improve the control of your diabetes.  please let me know if you wish to be referred to a dietician.  high blood sugar is very risky to your health.  you should see an eye doctor and dentist every year.  It is very important to get all recommended vaccinations.  check your blood sugar once a day.  vary the time of day when you check, between before the 3 meals, and at bedtime.  also check if you have symptoms of your blood sugar being too high or too low.  please keep a record of the readings and bring  it to your next appointment here (or you can bring the meter itself).  You can write it on any piece of paper.  please call us sooner if your blood sugar goes below 70, or if you have a lot of readings over 200.  Try injecting the injecting the insulin into your stomach. For now, please: Change the levemir to 60 units each morning, and:  reduce the glimepiride to 4 mg each morning, and: Please continue the same other diabetes medications. Please call or message [place] next week, to tell us how the blood sugar is doing.   Please come back for a follow-up appointment in 2 months.

## 2018-01-10 ENCOUNTER — Other Ambulatory Visit: Payer: Self-pay | Admitting: Family Medicine

## 2018-01-10 ENCOUNTER — Encounter: Payer: Self-pay | Admitting: Family Medicine

## 2018-01-10 DIAGNOSIS — IMO0001 Reserved for inherently not codable concepts without codable children: Secondary | ICD-10-CM

## 2018-01-10 DIAGNOSIS — E785 Hyperlipidemia, unspecified: Secondary | ICD-10-CM

## 2018-01-10 DIAGNOSIS — I10 Essential (primary) hypertension: Secondary | ICD-10-CM

## 2018-01-10 DIAGNOSIS — E1165 Type 2 diabetes mellitus with hyperglycemia: Secondary | ICD-10-CM

## 2018-01-10 DIAGNOSIS — E7849 Other hyperlipidemia: Secondary | ICD-10-CM

## 2018-01-13 MED ORDER — FENOFIBRATE 160 MG PO TABS: 160.0000 mg | ORAL_TABLET | Freq: Every day | ORAL | 5 refills | Status: DC

## 2018-01-13 MED ORDER — AZATHIOPRINE 50 MG PO TABS: ORAL_TABLET | ORAL | 5 refills | Status: DC

## 2018-01-20 ENCOUNTER — Encounter: Payer: Self-pay | Admitting: Family Medicine

## 2018-01-20 MED ORDER — FLUTICASONE PROPIONATE 50 MCG/ACT NA SUSP: 2.0000 | Freq: Every day | NASAL | 5 refills | Status: AC

## 2018-01-23 ENCOUNTER — Encounter: Payer: Self-pay | Admitting: Family Medicine

## 2018-01-23 MED ORDER — INSULIN PEN NEEDLE 32G X 4 MM MISC: 1 refills | Status: DC

## 2018-01-27 MED FILL — TRULICITY 1.5 MG/0.5 ML PEN: 1.5 | 28 days supply | Qty: 2 | Fill #2

## 2018-02-12 ENCOUNTER — Other Ambulatory Visit: Payer: Self-pay | Admitting: Family Medicine

## 2018-02-12 DIAGNOSIS — E785 Hyperlipidemia, unspecified: Secondary | ICD-10-CM

## 2018-02-12 DIAGNOSIS — IMO0001 Reserved for inherently not codable concepts without codable children: Secondary | ICD-10-CM

## 2018-02-12 DIAGNOSIS — I10 Essential (primary) hypertension: Secondary | ICD-10-CM

## 2018-02-12 DIAGNOSIS — E1165 Type 2 diabetes mellitus with hyperglycemia: Secondary | ICD-10-CM

## 2018-02-17 ENCOUNTER — Other Ambulatory Visit: Payer: Self-pay

## 2018-02-17 DIAGNOSIS — IMO0001 Reserved for inherently not codable concepts without codable children: Secondary | ICD-10-CM

## 2018-02-17 DIAGNOSIS — E785 Hyperlipidemia, unspecified: Secondary | ICD-10-CM

## 2018-02-17 DIAGNOSIS — E1165 Type 2 diabetes mellitus with hyperglycemia: Secondary | ICD-10-CM

## 2018-02-17 DIAGNOSIS — I10 Essential (primary) hypertension: Secondary | ICD-10-CM

## 2018-02-17 MED ORDER — GLIMEPIRIDE 4 MG PO TABS: 4.0000 mg | ORAL_TABLET | ORAL | 8 refills | Status: DC

## 2018-02-17 MED FILL — LEVEMIR FLEXTOUCH 100 UNITS: 100 | 54 days supply | Qty: 30 | Fill #2

## 2018-02-24 IMAGING — DX DG LUMBAR SPINE 2-3V
3 series · 3 of 3 positions shown · non-contrast
Comparison: None

CLINICAL DATA: Low back pain radiating down LEFT leg, no known
injury

EXAM:
LUMBAR SPINE - 2-3 VIEW

[l-spine ap]
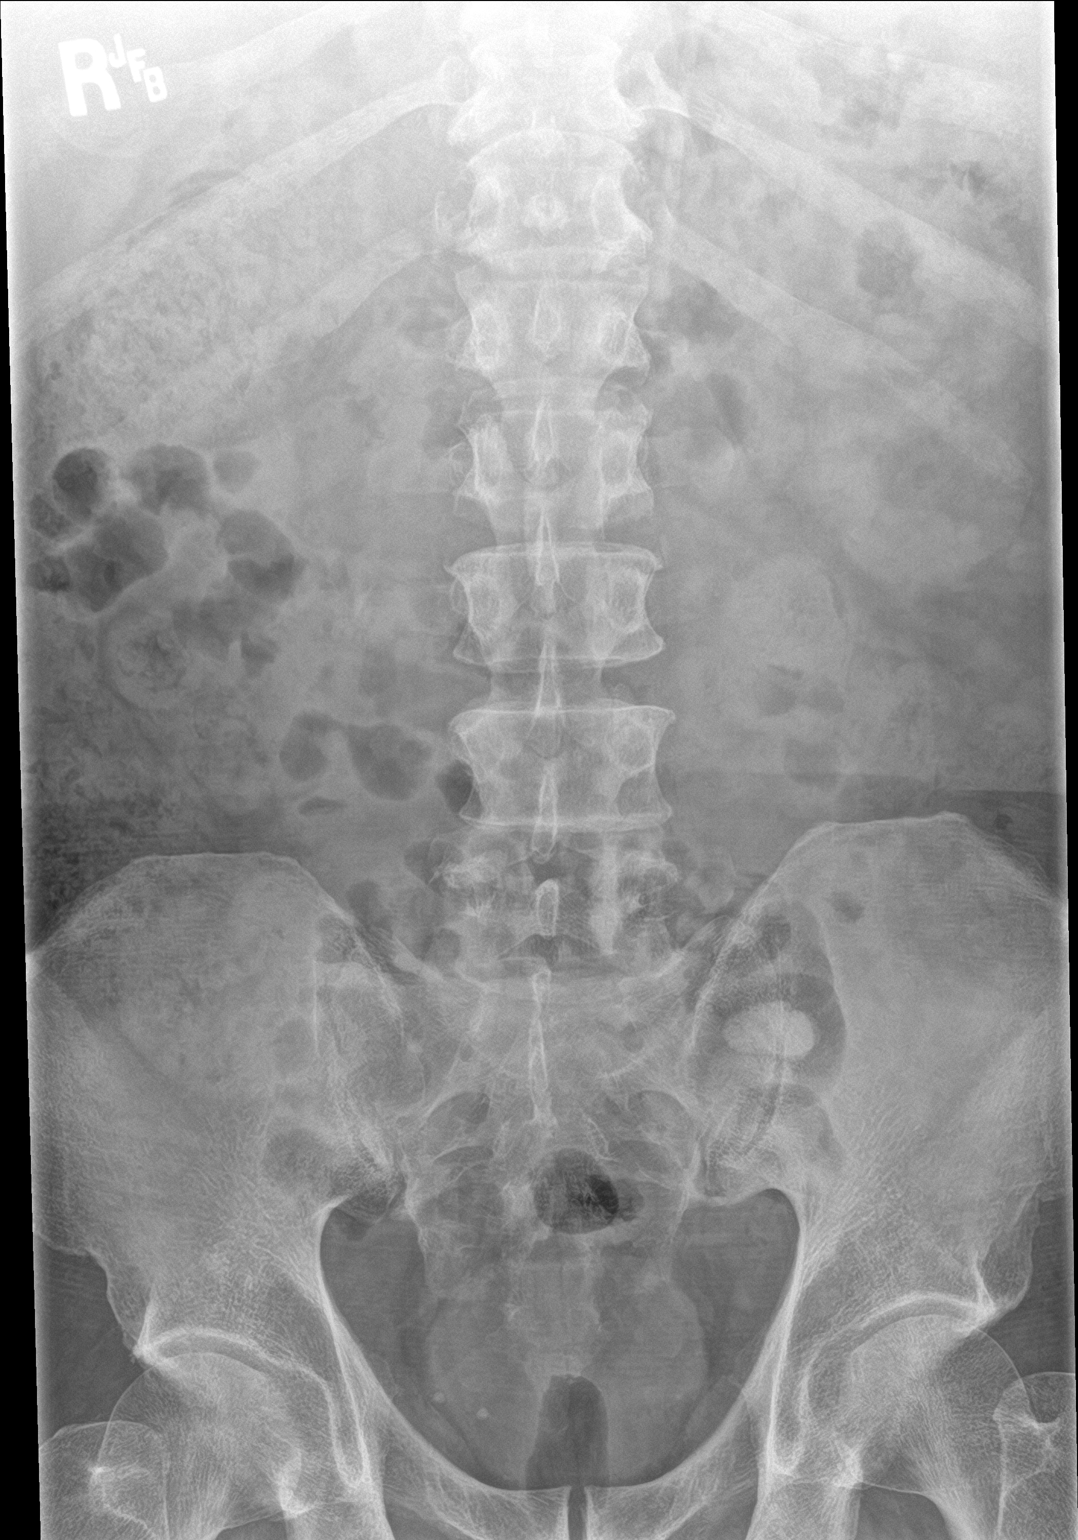

[l-spine lat]
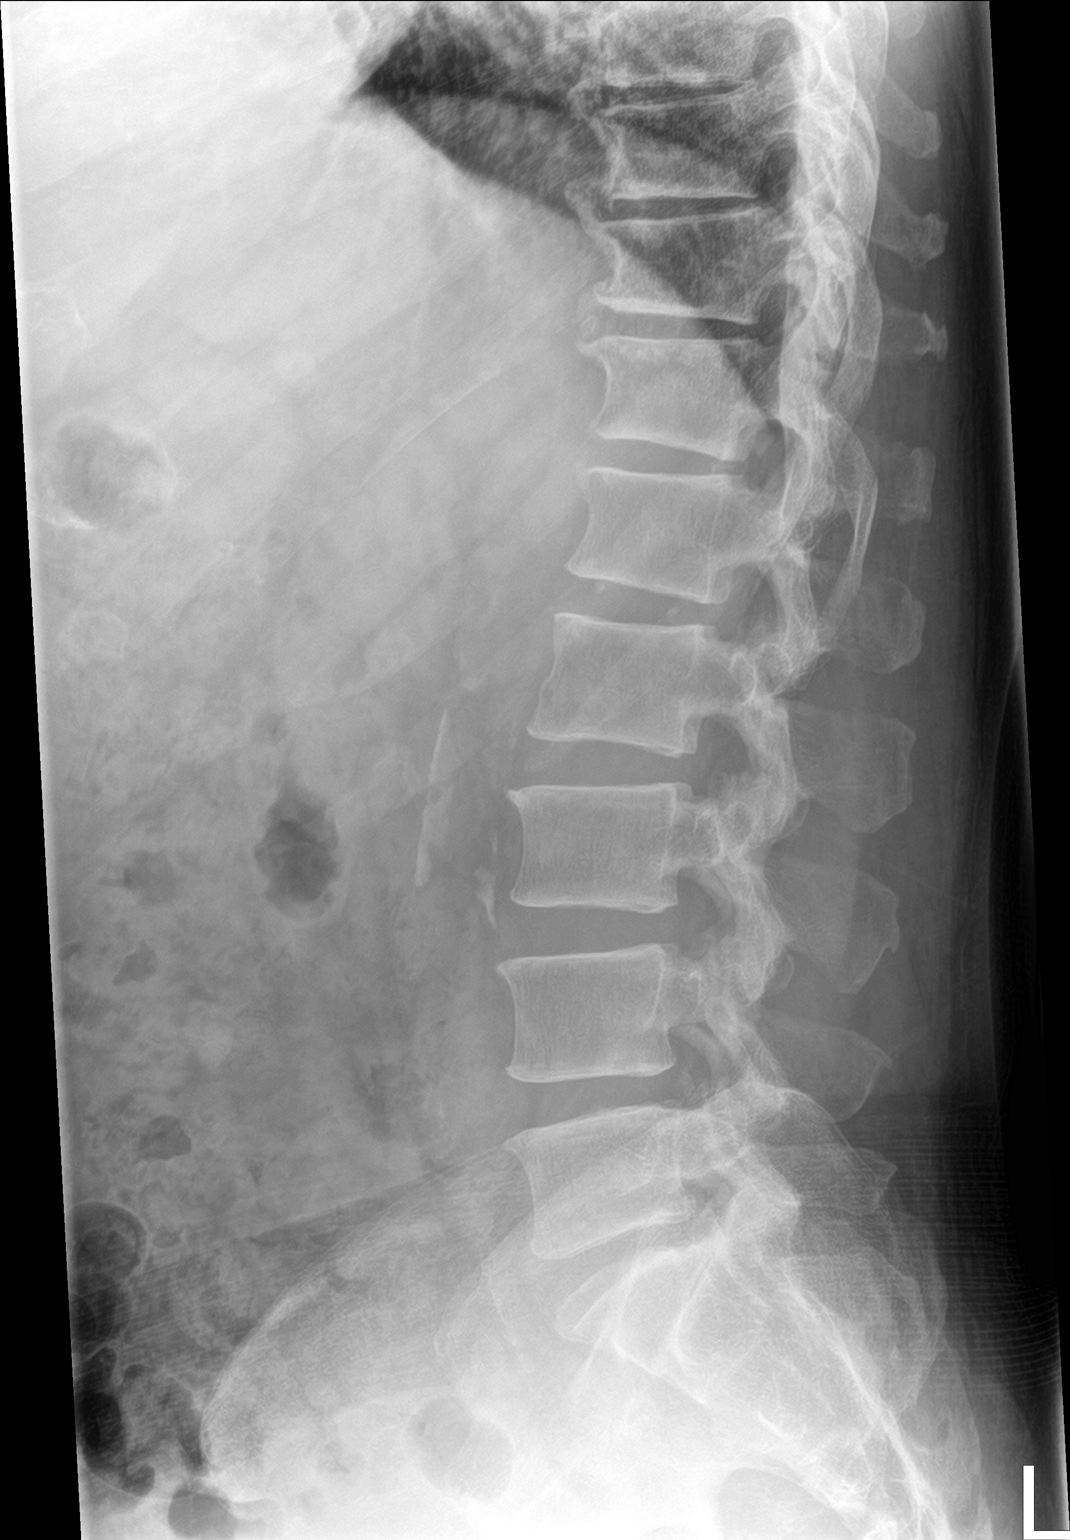

[l-spine spot]
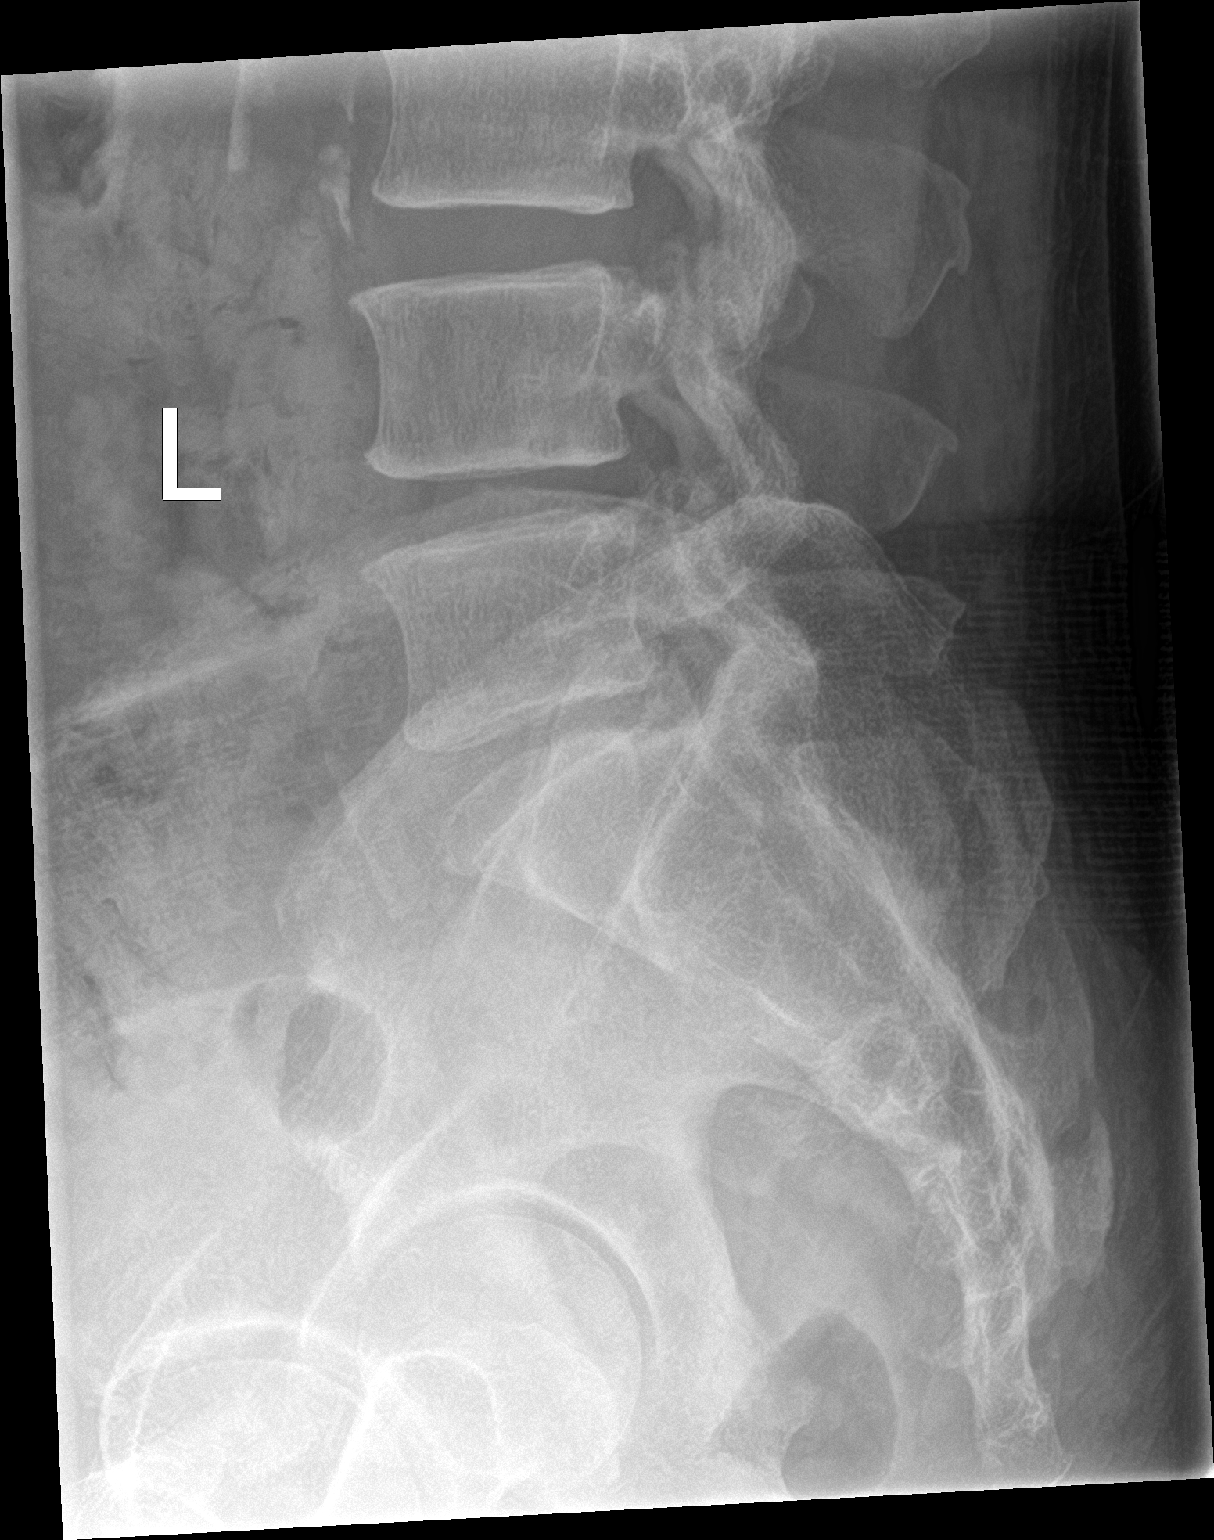

[3 of 3 positions shown; findings below may reference images not displayed]

FINDINGS: 5 non-rib-bearing lumbar vertebra.

Osseous mineralization normal.

No acute fracture, subluxation or bone destruction.

Mild degenerative disc disease changes at visualized lower thoracic
spine.

No gross evidence spondylolysis.

SI joints preserved.

Atherosclerotic calcifications aorta.
IMPRESSION: No acute lumbar spine abnormalities.

Mild degenerative disc disease changes at caudal aspect of thoracic
spine.

## 2018-02-25 MED FILL — TRULICITY 1.5 MG/0.5 ML PEN: 1.5 | 28 days supply | Qty: 2 | Fill #3

## 2018-03-11 ENCOUNTER — Ambulatory Visit: Payer: BLUE CROSS/BLUE SHIELD | Admitting: Endocrinology

## 2018-03-13 ENCOUNTER — Ambulatory Visit: Payer: BLUE CROSS/BLUE SHIELD | Admitting: Endocrinology

## 2018-03-27 MED FILL — TRULICITY 1.5 MG/0.5 ML PEN: 1.5 | 28 days supply | Qty: 2 | Fill #0

## 2018-03-31 MED FILL — LEVEMIR FLEXTOUCH 100 UNITS: 100 | 54 days supply | Qty: 30 | Fill #0

## 2018-04-07 ENCOUNTER — Encounter: Payer: Self-pay | Admitting: Family Medicine

## 2018-04-07 ENCOUNTER — Ambulatory Visit: Payer: BLUE CROSS/BLUE SHIELD | Admitting: Family Medicine

## 2018-04-07 VITALS — BP 118/70 | HR 92 | Temp 98.5°F | Resp 18 | Ht 66.0 in | Wt 186.6 lb

## 2018-04-07 DIAGNOSIS — K5180 Other ulcerative colitis without complications: Secondary | ICD-10-CM | POA: Diagnosis not present

## 2018-04-07 DIAGNOSIS — Z0000 Encounter for general adult medical examination without abnormal findings: Secondary | ICD-10-CM

## 2018-04-07 DIAGNOSIS — I10 Essential (primary) hypertension: Secondary | ICD-10-CM

## 2018-04-07 DIAGNOSIS — IMO0001 Reserved for inherently not codable concepts without codable children: Secondary | ICD-10-CM

## 2018-04-07 DIAGNOSIS — Z23 Encounter for immunization: Secondary | ICD-10-CM

## 2018-04-07 DIAGNOSIS — E782 Mixed hyperlipidemia: Secondary | ICD-10-CM

## 2018-04-07 DIAGNOSIS — E1165 Type 2 diabetes mellitus with hyperglycemia: Secondary | ICD-10-CM | POA: Diagnosis not present

## 2018-04-07 DIAGNOSIS — K760 Fatty (change of) liver, not elsewhere classified: Secondary | ICD-10-CM | POA: Diagnosis not present

## 2018-04-07 LAB — COMPREHENSIVE METABOLIC PANEL
ALT: 24 U/L (ref 0–53)
AST: 21 U/L (ref 0–37)
Albumin: 4.7 g/dL (ref 3.5–5.2)
Alkaline Phosphatase: 63 U/L (ref 39–117)
BUN: 12 mg/dL (ref 6–23)
CHLORIDE: 103 meq/L (ref 96–112)
CO2: 24 meq/L (ref 19–32)
CREATININE: 0.88 mg/dL (ref 0.40–1.50)
Calcium: 9.6 mg/dL (ref 8.4–10.5)
GFR: 96.11 mL/min (ref >60.00)
GLUCOSE: 297 mg/dL — AB (ref 70–99)
POTASSIUM: 4.5 meq/L (ref 3.5–5.1)
SODIUM: 137 meq/L (ref 135–145)
Total Bilirubin: 0.7 mg/dL (ref 0.2–1.2)
Total Protein: 7.5 g/dL (ref 6.0–8.3)

## 2018-04-07 LAB — CBC
HCT: 40.2 % (ref 39.0–52.0)
HEMOGLOBIN: 14.1 g/dL (ref 13.0–17.0)
MCHC: 35.1 g/dL (ref 30.0–36.0)
MCV: 94.8 fl (ref 78.0–100.0)
PLATELETS: 216 10*3/uL (ref 150.0–400.0)
RBC: 4.25 Mil/uL (ref 4.22–5.81)
RDW: 15.3 % (ref 11.5–15.5)
WBC: 4.7 10*3/uL (ref 4.0–10.5)

## 2018-04-07 LAB — LIPID PANEL
CHOL/HDL RATIO: 3
Cholesterol: 147 mg/dL (ref 0–200)
HDL: 46.9 mg/dL (ref >39.00)
LDL CALC: 77 mg/dL (ref 0–99)
NonHDL: 100.43
Triglycerides: 117 mg/dL (ref 0.0–149.0)
VLDL: 23.4 mg/dL (ref 0.0–40.0)

## 2018-04-07 LAB — TSH: TSH: 1.6 u[IU]/mL (ref 0.35–4.50)

## 2018-04-07 LAB — HEMOGLOBIN A1C: Hgb A1c MFr Bld: 7.5 % — ABNORMAL HIGH (ref 4.6–6.5)

## 2018-04-07 MED ORDER — TIZANIDINE HCL 4 MG PO TABS: 4.0000 mg | ORAL_TABLET | Freq: Four times a day (QID) | ORAL | 0 refills | Status: DC | PRN

## 2018-04-07 NOTE — Assessment & Plan Note (Signed)
Check cmp today

## 2018-04-07 NOTE — Patient Instructions (Signed)

## 2018-04-07 NOTE — Progress Notes (Signed)
Subjective:  I acted as a Education administrator for Dr. Charlett Blake. [name], [place]  Patient ID: Curtis Kennedy, male    DOB: 1966/03/23, 52 y.o.   MRN: [id number]  No chief complaint on file.   HPI  Patient is in today for 3 month follow up. Patient is following up in his hypertension, DM, hyperlipidemia, and other medical concerns. Patient has no acute concerns. No recent febrile illness or acute hospitalizations. Denies CP/palp/SOB/HA/congestion/fevers/GI or GU c/o. Taking meds as prescribed    Patient Care Team: Mosie Lukes, MD as PCP - General (Family Medicine) Renelda Loma, OD (Optometry)   Past Medical History:  Diagnosis Date  . Anemia 07/02/2017  . Colitis, ulcerative ([place]) Dx'd 2001   Dr. Erlene Quan; [facility]--has been in remission since about 2009  . Diabetes mellitus without complication ([place]) 52 yrs old   type 2 (dx'd after long courses of prednisone for his UC)  . Erectile dysfunction   . Family history of tinea corporis 07/25/2012  . Hyperlipidemia   . Hypertension   . Insomnia   . Low back pain 08/07/2016  . Preventative health care 12/31/2016  . Right shoulder pain 12/31/2016  . Sinusitis 08/28/2015    Past Surgical History:  Procedure Laterality Date  . TRIGGER FINGER RELEASE  2011   b/l middle fingers    Family History  Problem Relation Age of Onset  . Diabetes Father   . Hyperlipidemia Father   . Hypertension Father   . Kidney disease Father        Kidney Failure  . Heart disease Father 90       MI  . Hypertension Sister   . Hypertension Brother   . Diabetes Paternal Grandfather   . Hypertension Brother   . Hyperlipidemia Brother   . Heart disease Brother        Valve Surgery ?  . Diabetes Brother   . Hypertension Brother   . ADD / ADHD Brother     Social History   Socioeconomic History  . Marital status: Married    Spouse name: Not on file  . Number of children: Not on file  . Years of education: Not on file  . Highest education level: Not on  file  Occupational History  . Not on file  Social Needs  . Financial resource strain: Not on file  . Food insecurity:    Worry: Not on file    Inability: Not on file  . Transportation needs:    Medical: Not on file    Non-medical: Not on file  Tobacco Use  . Smoking status: Never Smoker  . Smokeless tobacco: Never Used  Substance and Sexual Activity  . Alcohol use: No  . Drug use: No  . 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 Physical activity:    Days per week: Not on file    Minutes per session: Not on file  . Stress: Not on file  Relationships  . Social connections:    Talks on phone: Not on file    Gets together: Not on file    Attends religious service: Not on file    Active member of club or organization: Not on file    Attends meetings of clubs or organizations: Not on file    Relationship status: Not on file  . Intimate partner violence:    Fear of current or  ex partner: Not on file    Emotionally abused: Not on file    Physically abused: Not on file    Forced sexual activity: Not on file  Other Topics Concern  . Not on file  Social History Narrative   Married, 54 y/o girl.     Lives in [place], grew up in [place]--relocated to [facility] 2007.   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          Outpatient Medications Prior to Visit  Medication Sig Dispense Refill  . albuterol (PROVENTIL HFA;VENTOLIN HFA) 108 (90 Base) MCG/ACT inhaler Inhale 2 puffs into the lungs every 6 (six) hours as needed for wheezing or shortness of breath. 1 Inhaler 0  . azaTHIOprine (IMURAN) 50 MG tablet TAKE 4 AND 1/2 TABLETS BY MOUTH DAILY 135 tablet 0  . azaTHIOprine (IMURAN) 50 MG tablet TAKE 4 AND 1/2 TABLETS BY MOUTH DAILY 135 tablet 5  . balsalazide (COLAZAL) 750 MG capsule Take 3 capsules (2,250 mg total) by mouth 3 (three) times daily.  270 capsule 3  . busPIRone (BUSPAR) 5 MG tablet Take 5 mg by mouth 2 (two) times daily.    . Dulaglutide (TRULICITY) 1.5 XV/4.0GQ SOPN INJECT 1.5 MG INTO THE SKIN ONCE A WEEK. 2 mL 3  . fenofibrate 160 MG tablet TAKE 1 TABLET BY MOUTH DAILY 30 tablet 0  . fenofibrate 160 MG tablet Take 1 tablet (160 mg total) by mouth daily. 30 tablet 5  . fluticasone (FLONASE) 50 MCG/ACT nasal spray Place 2 sprays into both nostrils daily. 16 g 5  . fluticasone (FLOVENT HFA) 110 MCG/ACT inhaler Inhale 2 puffs into the lungs 2 (two) times daily. 1 Inhaler 2  . fluticasone (FLOVENT HFA) 110 MCG/ACT inhaler Inhale 2 puffs into the lungs 2 (two) times daily. 1 Inhaler 2  . glimepiride (AMARYL) 4 MG tablet TAKE 1 TABLET BY MOUTH TWICE DAILY 60 tablet 0  . glimepiride (AMARYL) 4 MG tablet Take 1 tablet (4 mg total) by mouth every morning. 30 tablet 8  . glucose blood (ACCU-CHEK AVIVA PLUS) test strip Use twice daily to check blood sugar.  DX E11.9 100 each 4  . guaiFENesin-codeine 100-10 MG/5ML syrup Take 5 mLs by mouth every 6 (six) hours as needed for cough. 120 mL 0  . Insulin Detemir (LEVEMIR FLEXTOUCH) 100 UNIT/ML Pen Inject 60 Units into the skin every morning. And pen needles 2/day 30 mL 2  . Insulin Pen Needle (BD PEN NEEDLE NANO U/F) 32G X 4 MM MISC USE ONE NEEDLE DAILY 100 each 1  . Lancets (ONETOUCH ULTRASOFT) lancets Use as directed twice daily to check blood sugar.  DX E11.9 100 each 6  . levocetirizine (XYZAL) 5 MG tablet Take 1 tablet (5 mg total) by mouth every evening. 90 tablet 3  . losartan (COZAAR) 50 MG tablet Take 1 tablet (50 mg total) by mouth daily. 30 tablet 6  . meloxicam (MOBIC) 15 MG tablet Take 15 mg by mouth daily.    . metFORMIN (GLUCOPHAGE) 1000 MG tablet Take 1 tablet (1,000 mg total) by mouth 2 (two) times daily with a meal. 180 tablet 6  . metoprolol succinate (TOPROL-XL) 50 MG 24 hr tablet Take 1 tablet (50 mg total) by mouth daily. Take with or immediately following a meal. 30  tablet 0  . olopatadine (PATANOL) 0.1 % ophthalmic solution Place 1 drop into both eyes 2 (two) times daily.  5 mL 3  . Pitavastatin Calcium (LIVALO) 4 MG TABS Take 1 tablet (4 mg total) by mouth daily. 30 tablet 6  . sildenafil (REVATIO) 20 MG tablet 1 tab po 2-5 tabs po as needed for sexual activity. 50 tablet 0  . tadalafil (CIALIS) 5 MG tablet Take 5 mg by mouth daily as needed.    . traMADol (ULTRAM) 50 MG tablet Take 1 tablet (50 mg total) by mouth every 8 (eight) hours as needed. 60 tablet 0  . valACYclovir (VALTREX) 1000 MG tablet Take 1 tablet (1,000 mg total) by mouth 2 (two) times daily as needed. 30 tablet 6  . tiZANidine (ZANAFLEX) 4 MG tablet Take 1 tablet (4 mg total) by mouth every 6 (six) hours as needed for muscle spasms. 30 tablet 0   No facility-administered medications prior to visit.     Allergies  Allergen Reactions  . Simvastatin     Myalgias    Review of Systems  Constitutional: Negative for fever and malaise/fatigue.  HENT: Negative for congestion.   Eyes: Negative for blurred vision.  Respiratory: Negative for cough and shortness of breath.   Cardiovascular: Negative for chest pain, palpitations and leg swelling.  Gastrointestinal: Negative for vomiting.  Musculoskeletal: Negative for back pain.  Skin: Negative for rash.  Neurological: Negative for loss of consciousness and headaches.       Objective:    Physical Exam  Constitutional: He is oriented to person, place, and time. He appears well-developed and well-nourished. No distress.  HENT:  Head: Normocephalic and atraumatic.  Eyes: Conjunctivae are normal.  Neck: Normal range of motion. No thyromegaly present.  Cardiovascular: Normal rate and regular rhythm.  Pulmonary/Chest: Effort normal and breath sounds normal. He has no wheezes.  Abdominal: Soft. Bowel sounds are normal. There is no tenderness.  Musculoskeletal: Normal range of motion. He exhibits no edema or deformity.  Neurological: He is  alert and oriented to person, place, and time.  Skin: Skin is warm and dry. He is not diaphoretic.  Psychiatric: He has a normal mood and affect.    BP 118/70 (BP Location: Left Arm, Patient Position: Sitting, Cuff Size: Normal)   Pulse 92   Temp 98.5 F (36.9 C) (Oral)   Resp 18   Ht 5' 6"  (1.676 m)   Wt 186 lb 9.6 oz (84.6 kg)   SpO2 98%   BMI 30.12 kg/m  Wt Readings from Last 3 Encounters:  04/07/18 186 lb 9.6 oz (84.6 kg)  01/07/18 186 lb 3.2 oz (84.5 kg)  01/02/18 187 lb 6.4 oz (85 kg)   BP Readings from Last 3 Encounters:  04/07/18 118/70  01/07/18 (!) 142/80  01/02/18 122/72     Immunization History  Administered Date(s) Administered  . Influenza Split 10/16/2011, 08/21/2012, 07/29/2014  . Influenza,inj,Quad PF,6+ Mos 08/07/2016  . Influenza-Unspecified 07/27/2015  . Pneumococcal Conjugate-13 10/16/2011  . Pneumococcal Polysaccharide-23 01/02/2018  . Tdap 04/02/2017    Health Maintenance  Topic Date Due  . COLONOSCOPY  10/13/2014  . OPHTHALMOLOGY EXAM  02/19/2017  . INFLUENZA VACCINE  05/15/2018  . HEMOGLOBIN A1C  10/07/2018  . FOOT EXAM  01/08/2019  . PNEUMOCOCCAL POLYSACCHARIDE VACCINE (2) 01/03/2023  . TETANUS/TDAP  04/03/2027  . Hepatitis C Screening  Completed  . HIV Screening  Completed    Lab Results  Component Value Date   WBC 4.7 04/07/2018   HGB 14.1 04/07/2018   HCT 40.2 04/07/2018   PLT 216.0 04/07/2018   GLUCOSE 297 (H) 04/07/2018  CHOL 147 04/07/2018   TRIG 117.0 04/07/2018   HDL 46.90 04/07/2018   LDLCALC 77 04/07/2018   ALT 24 04/07/2018   AST 21 04/07/2018   NA 137 04/07/2018   K 4.5 04/07/2018   CL 103 04/07/2018   CREATININE 0.88 04/07/2018   BUN 12 04/07/2018   CO2 24 04/07/2018   TSH 1.60 04/07/2018   PSA 0.58 12/31/2016   HGBA1C 7.5 (H) 04/07/2018   MICROALBUR 7.3 (H) 11/24/2015    Lab Results  Component Value Date   TSH 1.60 04/07/2018   Lab Results  Component Value Date   WBC 4.7 04/07/2018   HGB 14.1  04/07/2018   HCT 40.2 04/07/2018   MCV 94.8 04/07/2018   PLT 216.0 04/07/2018   Lab Results  Component Value Date   NA 137 04/07/2018   K 4.5 04/07/2018   CO2 24 04/07/2018   GLUCOSE 297 (H) 04/07/2018   BUN 12 04/07/2018   CREATININE 0.88 04/07/2018   BILITOT 0.7 04/07/2018   ALKPHOS 63 04/07/2018   AST 21 04/07/2018   ALT 24 04/07/2018   PROT 7.5 04/07/2018   ALBUMIN 4.7 04/07/2018   CALCIUM 9.6 04/07/2018   GFR 96.11 04/07/2018   Lab Results  Component Value Date   CHOL 147 04/07/2018   Lab Results  Component Value Date   HDL 46.90 04/07/2018   Lab Results  Component Value Date   LDLCALC 77 04/07/2018   Lab Results  Component Value Date   TRIG 117.0 04/07/2018   Lab Results  Component Value Date   CHOLHDL 3 04/07/2018   Lab Results  Component Value Date   HGBA1C 7.5 (H) 04/07/2018         Assessment & Plan:   Problem List Items Addressed This Visit    Hypertension    Well controlled, no changes to meds. Encouraged heart healthy diet such as the DASH diet and exercise as tolerated.       Relevant Orders   CBC (Completed)   Comprehensive metabolic panel (Completed)   TSH (Completed)   Hyperlipidemia    Encouraged heart healthy diet, increase exercise, avoid trans fats, consider a krill oil cap daily      Relevant Orders   Lipid panel (Completed)   Colitis, ulcerative ([place])    Asymptomatic and doing well      Uncontrolled type 2 diabetes mellitus without complication, without long-term current use of insulin ([facility])    hgba1c acceptable, minimize simple carbs. Increase exercise as tolerated.      Relevant Orders   Hemoglobin A1c (Completed)   Preventative health care   Fatty liver disease, nonalcoholic    Check cmp today       Other Visit Diagnoses    Need for immunization against measles    -  Primary   Relevant Orders   Measles ([place]) Antibody IgG (Completed)      I am having Curtis Banda Risio "Joe" maintain his meloxicam,  tadalafil, busPIRone, sildenafil, onetouch ultrasoft, glucose blood, valACYclovir, fluticasone, albuterol, fluticasone, olopatadine, Pitavastatin Calcium, traMADol, balsalazide, guaiFENesin-codeine, losartan, levocetirizine, metoprolol succinate, metFORMIN, Dulaglutide, Insulin Detemir, fenofibrate, azaTHIOprine, azaTHIOprine, fenofibrate, fluticasone, Insulin Pen Needle, glimepiride, glimepiride, and tiZANidine.  Meds ordered this encounter  Medications  . tiZANidine (ZANAFLEX) 4 MG tablet    Sig: Take 1 tablet (4 mg total) by mouth every 6 (six) hours as needed for muscle spasms.    Dispense:  30 tablet    Refill:  0    CMA served as  scribe during this visit. History, Physical and Plan performed by medical provider. Documentation and orders reviewed and attested to.  Penni Homans, MD

## 2018-04-07 NOTE — Assessment & Plan Note (Signed)
Encouraged heart healthy diet, increase exercise, avoid trans fats, consider a krill oil cap daily 

## 2018-04-07 NOTE — Assessment & Plan Note (Signed)
Asymptomatic and doing well

## 2018-04-07 NOTE — Assessment & Plan Note (Signed)
hgba1c acceptable, minimize simple carbs. Increase exercise as tolerated.  

## 2018-04-07 NOTE — Assessment & Plan Note (Signed)
Well controlled, no changes to meds. Encouraged heart healthy diet such as the DASH diet and exercise as tolerated.  °

## 2018-04-07 NOTE — Assessment & Plan Note (Deleted)
Patient encouraged to maintain heart healthy diet, regular exercise, adequate sleep. Consider daily probiotics. Take medications as prescribed 

## 2018-04-08 LAB — RUBEOLA ANTIBODY IGG: Rubeola IgG: >300 AU/mL

## 2018-04-14 ENCOUNTER — Encounter: Payer: Self-pay | Admitting: Family Medicine

## 2018-04-16 ENCOUNTER — Ambulatory Visit: Payer: BLUE CROSS/BLUE SHIELD | Admitting: Endocrinology

## 2018-04-16 ENCOUNTER — Encounter: Payer: Self-pay | Admitting: Endocrinology

## 2018-04-16 VITALS — BP 124/70 | HR 94 | Wt 189.2 lb

## 2018-04-16 DIAGNOSIS — IMO0001 Reserved for inherently not codable concepts without codable children: Secondary | ICD-10-CM

## 2018-04-16 DIAGNOSIS — E1165 Type 2 diabetes mellitus with hyperglycemia: Secondary | ICD-10-CM | POA: Diagnosis not present

## 2018-04-16 MED ORDER — GLIMEPIRIDE 1 MG PO TABS: 1.0000 mg | ORAL_TABLET | Freq: Every day | ORAL | 5 refills | Status: DC

## 2018-04-16 NOTE — Progress Notes (Signed)
Subjective:    Patient ID: Curtis Kennedy, male    DOB: 1966-09-28, 52 y.o.   MRN: [id number]  HPI Pt returns for f/u of diabetes mellitus: DM type: Insulin-requiring type 2 Dx'ed: 2836 Complications: none Therapy: insulin since 6294, trulicity, and 2 oral meds.   DKA: never Severe hypoglycemia: never Pancreatitis: never Pancreatic imaging: normal on 2018 [place] Other: he seldom checks cbg's; he declines multiple daily injections. Interval history: no cbg record, but states he has frequent mild hypoglycemia in the middle of the night.  Otherwise, pt states he feels well in general. Past Medical History:  Diagnosis Date  . Anemia 07/02/2017  . 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 Erlene Quan; [facility]--has been in remission since about 2009  . Diabetes mellitus without complication ([place]) 52 yrs old   type 2 (dx'd after long courses of prednisone for his UC)  . Erectile dysfunction   . Family history of tinea corporis 07/25/2012  . Hyperlipidemia   . Hypertension   . Insomnia   . Low back pain 08/07/2016  . Preventative health care 12/31/2016  . Right shoulder pain 12/31/2016  . Sinusitis 08/28/2015    Past Surgical History:  Procedure Laterality Date  . TRIGGER FINGER RELEASE  2011   b/l middle fingers    Social History   Socioeconomic History  . Marital status: Married    Spouse name: Not on file  . Number of children: Not on file  . Years of education: Not on file  . Highest education level: Not on file  Occupational History  . Not on file  Social Needs  . Financial resource strain: Not on file  . Food insecurity:    Worry: Not on file    Inability: Not on file  . Transportation needs:    Medical: Not on file    Non-medical: Not on file  Tobacco Use  . Smoking status: Never Smoker  . Smokeless tobacco: Never Used  Substance and Sexual Activity  . Alcohol use: No  . Drug use: No  . Sexual activity: Yes    Partners: Female    Comment: lives with wife,  does office work follows with  Lifestyle  . Physical activity:    Days per week: Not on file    Minutes per session: Not on file  . Stress: Not on file  Relationships  . Social connections:    Talks on phone: Not on file    Gets together: Not on file    Attends religious service: Not on file    Active member of club or organization: Not on file    Attends meetings of clubs or organizations: Not on file    Relationship status: Not on file  . Intimate partner violence:    Fear of current or ex partner: Not on file    Emotionally abused: Not on file    Physically abused: Not on file    Forced sexual activity: Not on file  Other Topics Concern  . Not on file  Social History Narrative   Married, 54 y/o girl.     Lives in [place], grew up in [place]--relocated to [facility] 2007.   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          Current Outpatient Medications on File Prior to Visit  Medication Sig Dispense Refill  . albuterol (PROVENTIL HFA;VENTOLIN HFA) 108 (90 Base) MCG/ACT  inhaler Inhale 2 puffs into the lungs every 6 (six) hours as needed for wheezing or shortness of breath. 1 Inhaler 0  . azaTHIOprine (IMURAN) 50 MG tablet TAKE 4 AND 1/2 TABLETS BY MOUTH DAILY 135 tablet 0  . azaTHIOprine (IMURAN) 50 MG tablet TAKE 4 AND 1/2 TABLETS BY MOUTH DAILY 135 tablet 5  . balsalazide (COLAZAL) 750 MG capsule Take 3 capsules (2,250 mg total) by mouth 3 (three) times daily. 270 capsule 3  . busPIRone (BUSPAR) 5 MG tablet Take 5 mg by mouth 2 (two) times daily.    . Dulaglutide (TRULICITY) 1.5 SA/6.3KZ SOPN INJECT 1.5 MG INTO THE SKIN ONCE A WEEK. 2 mL 3  . fenofibrate 160 MG tablet TAKE 1 TABLET BY MOUTH DAILY 30 tablet 0  . fenofibrate 160 MG tablet Take 1 tablet (160 mg total) by mouth daily. 30 tablet 5  . fluticasone (FLONASE) 50 MCG/ACT nasal spray Place 2 sprays into both nostrils daily. 16 g 5  . fluticasone (FLOVENT  HFA) 110 MCG/ACT inhaler Inhale 2 puffs into the lungs 2 (two) times daily. 1 Inhaler 2  . fluticasone (FLOVENT HFA) 110 MCG/ACT inhaler Inhale 2 puffs into the lungs 2 (two) times daily. 1 Inhaler 2  . glucose blood (ACCU-CHEK AVIVA PLUS) test strip Use twice daily to check blood sugar.  DX E11.9 100 each 4  . guaiFENesin-codeine 100-10 MG/5ML syrup Take 5 mLs by mouth every 6 (six) hours as needed for cough. 120 mL 0  . Insulin Detemir (LEVEMIR FLEXTOUCH) 100 UNIT/ML Pen Inject 60 Units into the skin every morning. And pen needles 2/day 30 mL 2  . Insulin Pen Needle (BD PEN NEEDLE NANO U/F) 32G X 4 MM MISC USE ONE NEEDLE DAILY 100 each 1  . Lancets (ONETOUCH ULTRASOFT) lancets Use as directed twice daily to check blood sugar.  DX E11.9 100 each 6  . levocetirizine (XYZAL) 5 MG tablet Take 1 tablet (5 mg total) by mouth every evening. 90 tablet 3  . losartan (COZAAR) 50 MG tablet Take 1 tablet (50 mg total) by mouth daily. 30 tablet 6  . meloxicam (MOBIC) 15 MG tablet Take 15 mg by mouth daily.    . metFORMIN (GLUCOPHAGE) 1000 MG tablet Take 1 tablet (1,000 mg total) by mouth 2 (two) times daily with a meal. 180 tablet 6  . metoprolol succinate (TOPROL-XL) 50 MG 24 hr tablet Take 1 tablet (50 mg total) by mouth daily. Take with or immediately following a meal. 30 tablet 0  . olopatadine (PATANOL) 0.1 % ophthalmic solution Place 1 drop into both eyes 2 (two) times daily. 5 mL 3  . Pitavastatin Calcium (LIVALO) 4 MG TABS Take 1 tablet (4 mg total) by mouth daily. 30 tablet 6  . sildenafil (REVATIO) 20 MG tablet 1 tab po 2-5 tabs po as needed for sexual activity. 50 tablet 0  . tadalafil (CIALIS) 5 MG tablet Take 5 mg by mouth daily as needed.    Marland Kitchen tiZANidine (ZANAFLEX) 4 MG tablet Take 1 tablet (4 mg total) by mouth every 6 (six) hours as needed for muscle spasms. 30 tablet 0  . traMADol (ULTRAM) 50 MG tablet Take 1 tablet (50 mg total) by mouth every 8 (eight) hours as needed. 60 tablet 0  .  valACYclovir (VALTREX) 1000 MG tablet Take 1 tablet (1,000 mg total) by mouth 2 (two) times daily as needed. 30 tablet 6   No current facility-administered medications on file prior to visit.  Allergies  Allergen Reactions  . Simvastatin     Myalgias    Family History  Problem Relation Age of Onset  . Diabetes Father   . Hyperlipidemia Father   . Hypertension Father   . Kidney disease Father        Kidney Failure  . Heart disease Father 28       MI  . Hypertension Sister   . Hypertension Brother   . Diabetes Paternal Grandfather   . Hypertension Brother   . Hyperlipidemia Brother   . Heart disease Brother        Valve Surgery ?  . Diabetes Brother   . Hypertension Brother   . ADD / ADHD Brother     BP 124/70 (BP Location: Left Arm, Patient Position: Sitting, Cuff Size: Normal)   Pulse 94   Wt 189 lb 3.2 oz (85.8 kg)   SpO2 97%   BMI 30.54 kg/m    Review of Systems He denies hypoglycemia    Objective:   Physical Exam VITAL SIGNS:  See vs page GENERAL: no distress Pulses: foot pulses are intact bilaterally.   MSK: no deformity of the feet or ankles.  CV: no edema of the legs or ankles Skin:  no ulcer on the feet or ankles.  normal color and temp on the feet and ankles Neuro: sensation is intact to touch on the feet and ankles.     Lab Results  Component Value Date   HGBA1C 7.5 (H) 04/07/2018       Assessment & Plan:  Insulin-requiring type 2 DM: goal is to transition off glimepiride.  We discussed.  Pt wants to do so in stages. Hypoglycemia, at night.  He should have levemir rather than glargine.   Patient Instructions  check your blood sugar once a day.  vary the time of day when you check, between before the 3 meals, and at bedtime.  also check if you have symptoms of your blood sugar being too high or too low.  please keep a record of the readings and bring it to your next appointment here (or you can bring the meter itself).  You can write it on  any piece of paper.  please call us sooner if your blood sugar goes below 70, or if you have a lot of readings over 200.  For now, please: increase the levemir to 60 units each morning, and:  reduce the glimepiride to 1 mg each morning, and: Please continue the same other diabetes medications. Please call or message [place] next week, to tell us how the blood sugar is doing.   Please come back for a follow-up appointment in 2-3 months.

## 2018-04-16 NOTE — Patient Instructions (Addendum)
check your blood sugar once a day.  vary the time of day when you check, between before the 3 meals, and at bedtime.  also check if you have symptoms of your blood sugar being too high or too low.  please keep a record of the readings and bring it to your next appointment here (or you can bring the meter itself).  You can write it on any piece of paper.  please call us sooner if your blood sugar goes below 70, or if you have a lot of readings over 200.  For now, please: increase the levemir to 60 units each morning, and:  reduce the glimepiride to 1 mg each morning, and: Please continue the same other diabetes medications. Please call or message [place] next week, to tell us how the blood sugar is doing.   Please come back for a follow-up appointment in 2-3 months.

## 2018-04-23 MED FILL — TRULICITY 1.5 MG/0.5 ML PEN: 1.5 | 28 days supply | Qty: 2 | Fill #1

## 2018-05-10 ENCOUNTER — Other Ambulatory Visit: Payer: Self-pay | Admitting: Family Medicine

## 2018-05-22 MED FILL — LEVEMIR FLEXTOUCH 100 UNITS: 100 | 54 days supply | Qty: 30 | Fill #1

## 2018-05-22 MED FILL — TRULICITY 1.5 MG/0.5 ML PEN: 1.5 | 28 days supply | Qty: 2 | Fill #2

## 2018-06-07 ENCOUNTER — Other Ambulatory Visit: Payer: Self-pay | Admitting: Family Medicine

## 2018-06-07 DIAGNOSIS — IMO0001 Reserved for inherently not codable concepts without codable children: Secondary | ICD-10-CM

## 2018-06-07 DIAGNOSIS — E1165 Type 2 diabetes mellitus with hyperglycemia: Secondary | ICD-10-CM

## 2018-06-07 DIAGNOSIS — E785 Hyperlipidemia, unspecified: Secondary | ICD-10-CM

## 2018-06-07 DIAGNOSIS — I10 Essential (primary) hypertension: Secondary | ICD-10-CM

## 2018-06-23 MED FILL — TRULICITY 1.5 MG/0.5 ML PEN: 1.5 | 28 days supply | Qty: 2 | Fill #3

## 2018-07-06 ENCOUNTER — Other Ambulatory Visit: Payer: Self-pay | Admitting: Family Medicine

## 2018-07-06 DIAGNOSIS — I10 Essential (primary) hypertension: Secondary | ICD-10-CM

## 2018-07-06 DIAGNOSIS — E7849 Other hyperlipidemia: Secondary | ICD-10-CM

## 2018-07-06 DIAGNOSIS — IMO0001 Reserved for inherently not codable concepts without codable children: Secondary | ICD-10-CM

## 2018-07-06 DIAGNOSIS — E1165 Type 2 diabetes mellitus with hyperglycemia: Secondary | ICD-10-CM

## 2018-07-08 ENCOUNTER — Ambulatory Visit: Payer: BLUE CROSS/BLUE SHIELD | Admitting: Family Medicine

## 2018-07-08 ENCOUNTER — Encounter: Payer: Self-pay | Admitting: Family Medicine

## 2018-07-08 VITALS — BP 126/68 | HR 77 | Temp 97.9°F | Resp 18 | Wt 185.8 lb

## 2018-07-08 DIAGNOSIS — I10 Essential (primary) hypertension: Secondary | ICD-10-CM | POA: Diagnosis not present

## 2018-07-08 DIAGNOSIS — Z23 Encounter for immunization: Secondary | ICD-10-CM | POA: Diagnosis not present

## 2018-07-08 DIAGNOSIS — E7849 Other hyperlipidemia: Secondary | ICD-10-CM

## 2018-07-08 DIAGNOSIS — K5180 Other ulcerative colitis without complications: Secondary | ICD-10-CM

## 2018-07-08 DIAGNOSIS — G8929 Other chronic pain: Secondary | ICD-10-CM

## 2018-07-08 DIAGNOSIS — M545 Low back pain: Secondary | ICD-10-CM

## 2018-07-08 DIAGNOSIS — E785 Hyperlipidemia, unspecified: Secondary | ICD-10-CM

## 2018-07-08 DIAGNOSIS — E1165 Type 2 diabetes mellitus with hyperglycemia: Secondary | ICD-10-CM | POA: Diagnosis not present

## 2018-07-08 DIAGNOSIS — Z79899 Other long term (current) drug therapy: Secondary | ICD-10-CM

## 2018-07-08 DIAGNOSIS — H9312 Tinnitus, left ear: Secondary | ICD-10-CM

## 2018-07-08 DIAGNOSIS — H9319 Tinnitus, unspecified ear: Secondary | ICD-10-CM | POA: Insufficient documentation

## 2018-07-08 DIAGNOSIS — IMO0001 Reserved for inherently not codable concepts without codable children: Secondary | ICD-10-CM

## 2018-07-08 LAB — LIPID PANEL
Cholesterol: 120 mg/dL (ref 0–200)
HDL: 37.6 mg/dL — AB (ref >39.00)
LDL Cholesterol: 59 mg/dL (ref 0–99)
NONHDL: 81.97
Total CHOL/HDL Ratio: 3
Triglycerides: 117 mg/dL (ref 0.0–149.0)
VLDL: 23.4 mg/dL (ref 0.0–40.0)

## 2018-07-08 LAB — CBC
HCT: 39.7 % (ref 39.0–52.0)
HEMOGLOBIN: 13.7 g/dL (ref 13.0–17.0)
MCHC: 34.5 g/dL (ref 30.0–36.0)
MCV: 93.9 fl (ref 78.0–100.0)
Platelets: 219 10*3/uL (ref 150.0–400.0)
RBC: 4.23 Mil/uL (ref 4.22–5.81)
RDW: 14.4 % (ref 11.5–15.5)
WBC: 4.2 10*3/uL (ref 4.0–10.5)

## 2018-07-08 LAB — COMPREHENSIVE METABOLIC PANEL
ALT: 37 U/L (ref 0–53)
AST: 29 U/L (ref 0–37)
Albumin: 4.5 g/dL (ref 3.5–5.2)
Alkaline Phosphatase: 44 U/L (ref 39–117)
BILIRUBIN TOTAL: 0.8 mg/dL (ref 0.2–1.2)
BUN: 12 mg/dL (ref 6–23)
CO2: 25 meq/L (ref 19–32)
Calcium: 9.7 mg/dL (ref 8.4–10.5)
Chloride: 106 mEq/L (ref 96–112)
Creatinine, Ser: 0.81 mg/dL (ref 0.40–1.50)
GFR: 105.65 mL/min (ref >60.00)
GLUCOSE: 123 mg/dL — AB (ref 70–99)
Potassium: 4.4 mEq/L (ref 3.5–5.1)
Sodium: 140 mEq/L (ref 135–145)
Total Protein: 7.3 g/dL (ref 6.0–8.3)

## 2018-07-08 LAB — TSH: TSH: 1.6 u[IU]/mL (ref 0.35–4.50)

## 2018-07-08 LAB — HEMOGLOBIN A1C: HEMOGLOBIN A1C: 8.4 % — AB (ref 4.6–6.5)

## 2018-07-08 MED ORDER — AZATHIOPRINE 50 MG PO TABS: ORAL_TABLET | ORAL | 1 refills | Status: DC

## 2018-07-08 MED ORDER — METOPROLOL SUCCINATE ER 50 MG PO TB24: ORAL_TABLET | ORAL | 1 refills | Status: DC

## 2018-07-08 MED ORDER — LOSARTAN POTASSIUM 50 MG PO TABS: 50.0000 mg | ORAL_TABLET | Freq: Every day | ORAL | 1 refills | Status: DC

## 2018-07-08 MED ORDER — FENOFIBRATE 160 MG PO TABS: 160.0000 mg | ORAL_TABLET | Freq: Every day | ORAL | 1 refills | Status: DC

## 2018-07-08 MED ORDER — METFORMIN HCL 1000 MG PO TABS: 1000.0000 mg | ORAL_TABLET | Freq: Two times a day (BID) | ORAL | 1 refills | Status: DC

## 2018-07-08 MED ORDER — ZALEPLON 10 MG PO CAPS: 10.0000 mg | ORAL_CAPSULE | Freq: Every evening | ORAL | 2 refills | Status: DC | PRN

## 2018-07-08 NOTE — Assessment & Plan Note (Signed)
Left ear for several months. Sort of a buzzing. Did rupture the eardrum as a child. Referred to ENT for evaluation and warned there is likely no treatment if it is in fact tinnitus.

## 2018-07-08 NOTE — Assessment & Plan Note (Signed)
Well controlled, no changes to meds. Encouraged heart healthy diet such as the DASH diet and exercise as tolerated.  °

## 2018-07-08 NOTE — Patient Instructions (Signed)
Tinnitus Tinnitus refers to hearing a sound when there is no actual source for that sound. This is often described as ringing in the ears. However, people with this condition may hear a variety of noises. A person may hear the sound in one ear or in both ears. The sounds of tinnitus can be soft, loud, or somewhere in between. Tinnitus can last for a few seconds or can be constant for days. It may go away without treatment and come back at various times. When tinnitus is constant or happens often, it can lead to other problems, such as trouble sleeping and trouble concentrating. Almost everyone experiences tinnitus at some point. Tinnitus that is long-lasting (chronic) or comes back often is a problem that may require medical attention. What are the causes? The cause of tinnitus is often not known. In some cases, it can result from other problems or conditions, including:  Exposure to loud noises from machinery, music, or other sources.  Hearing loss.  Ear or sinus infections.  Earwax buildup.  A foreign object in the ear.  Use of certain medicines.  Use of alcohol and caffeine.  High blood pressure.  Heart diseases.  Anemia.  Allergies.  Meniere disease.  Thyroid problems.  Tumors.  An enlarged part of a weakened blood vessel (aneurysm).  What are the signs or symptoms? The main symptom of tinnitus is hearing a sound when there is no source for that sound. It may sound like:  Buzzing.  Roaring.  Ringing.  Blowing air, similar to the sound heard when you listen to a seashell.  Hissing.  Whistling.  Sizzling.  Humming.  Running water.  A sustained musical note.  How is this diagnosed? Tinnitus is diagnosed based on your symptoms. Your health care provider will do a physical exam. A comprehensive hearing exam (audiologic exam) will be done if your tinnitus:  Affects only one ear (unilateral).  Causes hearing difficulties.  Lasts 6 months or  longer.  You may also need to see a health care provider who specializes in hearing disorders (audiologist). You may be asked to complete a questionnaire to determine the severity of your tinnitus. Tests may be done to help determine the cause and to rule out other conditions. These can include:  Imaging studies of your head and brain, such as: ? A CT scan. ? An MRI.  An imaging study of your blood vessels (angiogram).  How is this treated? Treating an underlying medical condition can sometimes make tinnitus go away. If your tinnitus continues, other treatments may include:  Medicines, such as certain antidepressants or sleeping aids.  Sound generators to mask the tinnitus. These include: ? Tabletop sound machines that play relaxing sounds to help you fall asleep. ? Wearable devices that fit in your ear and play sounds or music. ? A small device that uses headphones to deliver a signal embedded in music (acoustic neural stimulation). In time, this may change the pathways of your brain and make you less sensitive to tinnitus. This device is used for very severe cases when no other treatment is working.  Therapy and counseling to help you manage the stress of living with tinnitus.  Using hearing aids or cochlear implants, if your tinnitus is related to hearing loss.  Follow these instructions at home:  When possible, avoid being in loud places and being exposed to loud sounds.  Wear hearing protection, such as earplugs, when you are exposed to loud noises.  Do not take stimulants, such as nicotine,   alcohol, or caffeine.  Practice techniques for reducing stress, such as meditation, yoga, or deep breathing.  Use a white noise machine, a humidifier, or other devices to mask the sound of tinnitus.  Sleep with your head slightly raised. This may reduce the impact of tinnitus.  Try to get plenty of rest each night. Contact a health care provider if:  You have tinnitus in just one  ear.  Your tinnitus continues for 3 weeks or longer without stopping.  Home care measures are not helping.  You have tinnitus after a head injury.  You have tinnitus along with any of the following: ? Dizziness. ? Loss of balance. ? Nausea and vomiting. This information is not intended to replace advice given to you by your health care provider. Make sure you discuss any questions you have with your health care provider. Document Released: 10/01/2005 Document Revised: 06/03/2016 Document Reviewed: 03/03/2014 Elsevier Interactive Patient Education  2018 [employer]  

## 2018-07-08 NOTE — Assessment & Plan Note (Signed)
No recent flare.

## 2018-07-08 NOTE — Progress Notes (Signed)
Subjective:  I acted as a Education administrator for Dr. Charlett Blake. [name], [place]  Patient ID: Curtis Kennedy, male    DOB: 10/29/1965, 52 y.o.   MRN: [id number]  No chief complaint on file.   HPI  Patient is in today for a 3 month follow up. He is following up on his hypertension, DM, and is complaining of persistent low back pain since 2018. No recent falls or trauma. No radicular symptoms or incontinence but it does affect his activity at times and is bothering him more recently. He is hoping for further work up and treatment. Tylenol is somewhat helpful temporarily. Denies CP/palp/SOB/HA/congestion/fevers/GI or GU c/o. Taking meds as prescribed  Patient Care Team: Mosie Lukes, MD as PCP - General (Family Medicine) Renelda Loma, OD (Optometry)   Past Medical History:  Diagnosis Date  . Anemia 07/02/2017  . Colitis, ulcerative ([place]) Dx'd 2001   Dr. Erlene Quan; [facility]--has been in remission since about 2009  . Diabetes mellitus without complication ([place]) 52 yrs old   type 2 (dx'd after long courses of prednisone for his UC)  . Erectile dysfunction   . Family history of tinea corporis 07/25/2012  . Hyperlipidemia   . Hypertension   . Insomnia   . Low back pain 08/07/2016  . Preventative health care 12/31/2016  . Right shoulder pain 12/31/2016  . Sinusitis 08/28/2015    Past Surgical History:  Procedure Laterality Date  . TRIGGER FINGER RELEASE  2011   b/l middle fingers    Family History  Problem Relation Age of Onset  . Diabetes Father   . Hyperlipidemia Father   . Hypertension Father   . Kidney disease Father        Kidney Failure  . Heart disease Father 62       MI  . Hypertension Sister   . Hypertension Brother   . Diabetes Paternal Grandfather   . Hypertension Brother   . Hyperlipidemia Brother   . Heart disease Brother        Valve Surgery ?  . Diabetes Brother   . Hypertension Brother   . ADD / ADHD Brother     Social History   Socioeconomic History  . Marital  status: Married    Spouse name: Not on file  . Number of children: Not on file  . Years of education: Not on file  . Highest education level: Not on file  Occupational History  . Not on file  Social Needs  . Financial resource strain: Not on file  . Food insecurity:    Worry: Not on file    Inability: Not on file  . Transportation needs:    Medical: Not on file    Non-medical: Not on file  Tobacco Use  . Smoking status: Never Smoker  . Smokeless tobacco: Never Used  Substance and Sexual Activity  . Alcohol use: No  . Drug use: No  . 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 Physical activity:    Days per week: Not on file    Minutes per session: Not on file  . Stress: Not on file  Relationships  . Social connections:    Talks on phone: Not on file    Gets together: Not on file    Attends religious service: Not on file    Active member of club or organization: Not on file  Attends meetings of clubs or organizations: Not on file    Relationship status: Not on file  . Intimate partner violence:    Fear of current or ex partner: Not on file    Emotionally abused: Not on file    Physically abused: Not on file    Forced sexual activity: Not on file  Other Topics Concern  . Not on file  Social History Narrative   Married, 54 y/o girl.     Lives in [place], grew up in [place]--relocated to [facility] 2007.   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          Outpatient Medications Prior to Visit  Medication Sig Dispense Refill  . albuterol (PROVENTIL HFA;VENTOLIN HFA) 108 (90 Base) MCG/ACT inhaler Inhale 2 puffs into the lungs every 6 (six) hours as needed for wheezing or shortness of breath. 1 Inhaler 0  . balsalazide (COLAZAL) 750 MG capsule Take 3 capsules (2,250 mg total) by mouth 3 (three) times daily. 270 capsule 3  . busPIRone  (BUSPAR) 5 MG tablet Take 5 mg by mouth 2 (two) times daily.    . Dulaglutide (TRULICITY) 1.5 YQ/6.5HQ SOPN INJECT 1.5 MG INTO THE SKIN ONCE A WEEK. 2 mL 3  . fluticasone (FLONASE) 50 MCG/ACT nasal spray Place 2 sprays into both nostrils daily. 16 g 5  . fluticasone (FLOVENT HFA) 110 MCG/ACT inhaler Inhale 2 puffs into the lungs 2 (two) times daily. 1 Inhaler 2  . fluticasone (FLOVENT HFA) 110 MCG/ACT inhaler Inhale 2 puffs into the lungs 2 (two) times daily. 1 Inhaler 2  . glimepiride (AMARYL) 1 MG tablet Take 1 tablet (1 mg total) by mouth daily with breakfast. 30 tablet 5  . glucose blood (ACCU-CHEK AVIVA PLUS) test strip Use twice daily to check blood sugar.  DX E11.9 100 each 4  . guaiFENesin-codeine 100-10 MG/5ML syrup Take 5 mLs by mouth every 6 (six) hours as needed for cough. 120 mL 0  . Insulin Detemir (LEVEMIR FLEXTOUCH) 100 UNIT/ML Pen Inject 60 Units into the skin every morning. And pen needles 2/day 30 mL 2  . Insulin Pen Needle (BD PEN NEEDLE NANO U/F) 32G X 4 MM MISC USE ONE NEEDLE DAILY 100 each 1  . Lancets (ONETOUCH ULTRASOFT) lancets Use as directed twice daily to check blood sugar.  DX E11.9 100 each 6  . levocetirizine (XYZAL) 5 MG tablet Take 1 tablet (5 mg total) by mouth every evening. 90 tablet 3  . meloxicam (MOBIC) 15 MG tablet Take 15 mg by mouth daily.    Marland Kitchen olopatadine (PATANOL) 0.1 % ophthalmic solution Place 1 drop into both eyes 2 (two) times daily. 5 mL 3  . Pitavastatin Calcium (LIVALO) 4 MG TABS Take 1 tablet (4 mg total) by mouth daily. 30 tablet 6  . sildenafil (REVATIO) 20 MG tablet 1 tab po 2-5 tabs po as needed for sexual activity. 50 tablet 0  . tadalafil (CIALIS) 5 MG tablet Take 5 mg by mouth daily as needed.    Marland Kitchen tiZANidine (ZANAFLEX) 4 MG tablet Take 1 tablet (4 mg total) by mouth every 6 (six) hours as needed for muscle spasms. 30 tablet 0  . traMADol (ULTRAM) 50 MG tablet Take 1 tablet (50 mg total) by mouth every 8 (eight) hours as needed. 60 tablet  0  . valACYclovir (VALTREX) 1000 MG tablet Take 1 tablet (1,000 mg total) by mouth 2 (two)  times daily as needed. 30 tablet 6  . azaTHIOprine (IMURAN) 50 MG tablet TAKE 4 AND 1/2 TABLETS BY MOUTH DAILY 135 tablet 0  . azaTHIOprine (IMURAN) 50 MG tablet TAKE 4 AND 1/2 TABLETS BY MOUTH DAILY 135 tablet 5  . fenofibrate 160 MG tablet Take 1 tablet (160 mg total) by mouth daily. 90 tablet 0  . losartan (COZAAR) 50 MG tablet Take 1 tablet (50 mg total) by mouth daily. 30 tablet 6  . metFORMIN (GLUCOPHAGE) 1000 MG tablet Take 1 tablet (1,000 mg total) by mouth 2 (two) times daily with a meal. 180 tablet 6  . metoprolol succinate (TOPROL-XL) 50 MG 24 hr tablet TAKE 1 TABLET BY MOUTH DAILY WITH OR IMMEDIATELY FOLLOWING A MEAL 30 tablet 0   No facility-administered medications prior to visit.     Allergies  Allergen Reactions  . Simvastatin     Myalgias    Review of Systems  Constitutional: Negative for fever and malaise/fatigue.  HENT: Negative for congestion.   Eyes: Negative for blurred vision.  Respiratory: Negative for shortness of breath.   Cardiovascular: Negative for chest pain, palpitations and leg swelling.  Gastrointestinal: Negative for abdominal pain, blood in stool and nausea.  Genitourinary: Negative for dysuria and frequency.  Musculoskeletal: Positive for back pain. Negative for falls.  Skin: Negative for rash.  Neurological: Negative for dizziness, loss of consciousness and headaches.  Endo/Heme/Allergies: Negative for environmental allergies.  Psychiatric/Behavioral: Negative for depression. The patient is not nervous/anxious.        Objective:    Physical Exam  Constitutional: He is oriented to person, place, and time. He appears well-developed and well-nourished. No distress.  HENT:  Head: Normocephalic and atraumatic.  Nose: Nose normal.  Eyes: Right eye exhibits no discharge. Left eye exhibits no discharge.  Neck: Normal range of motion. Neck supple.    Cardiovascular: Normal rate and regular rhythm.  No murmur heard. Pulmonary/Chest: Effort normal and breath sounds normal.  Abdominal: Soft. Bowel sounds are normal. There is no tenderness.  Musculoskeletal: He exhibits no edema.  Neurological: He is alert and oriented to person, place, and time.  Skin: Skin is warm and dry.  Psychiatric: He has a normal mood and affect.  Nursing note and vitals reviewed.   BP 126/68 (BP Location: Left Arm, Patient Position: Sitting, Cuff Size: Normal)   Pulse 77   Temp 97.9 F (36.6 C) (Oral)   Resp 18   Wt 185 lb 12.8 oz (84.3 kg)   SpO2 98%   BMI 29.99 kg/m  Wt Readings from Last 3 Encounters:  07/08/18 185 lb 12.8 oz (84.3 kg)  04/16/18 189 lb 3.2 oz (85.8 kg)  04/07/18 186 lb 9.6 oz (84.6 kg)   BP Readings from Last 3 Encounters:  07/08/18 126/68  04/16/18 124/70  04/07/18 118/70     Immunization History  Administered Date(s) Administered  . Influenza Split 10/16/2011, 08/21/2012, 07/29/2014  . Influenza,inj,Quad PF,6+ Mos 08/07/2016  . Influenza-Unspecified 07/27/2015  . Pneumococcal Conjugate-13 10/16/2011  . Pneumococcal Polysaccharide-23 01/02/2018  . Tdap 04/02/2017    Health Maintenance  Topic Date Due  . COLONOSCOPY  10/13/2014  . OPHTHALMOLOGY EXAM  02/19/2017  . INFLUENZA VACCINE  05/15/2018  . HEMOGLOBIN A1C  10/07/2018  . FOOT EXAM  04/17/2019  . TETANUS/TDAP  04/03/2027  . PNEUMOCOCCAL POLYSACCHARIDE VACCINE AGE 81-64 HIGH RISK  Completed  . Hepatitis C Screening  Completed  . HIV Screening  Completed    Lab Results  Component Value Date  WBC 4.7 04/07/2018   HGB 14.1 04/07/2018   HCT 40.2 04/07/2018   PLT 216.0 04/07/2018   GLUCOSE 297 (H) 04/07/2018   CHOL 147 04/07/2018   TRIG 117.0 04/07/2018   HDL 46.90 04/07/2018   LDLCALC 77 04/07/2018   ALT 24 04/07/2018   AST 21 04/07/2018   NA 137 04/07/2018   K 4.5 04/07/2018   CL 103 04/07/2018   CREATININE 0.88 04/07/2018   BUN 12 04/07/2018   CO2  24 04/07/2018   TSH 1.60 04/07/2018   PSA 0.58 12/31/2016   HGBA1C 7.5 (H) 04/07/2018   MICROALBUR 7.3 (H) 11/24/2015    Lab Results  Component Value Date   TSH 1.60 04/07/2018   Lab Results  Component Value Date   WBC 4.7 04/07/2018   HGB 14.1 04/07/2018   HCT 40.2 04/07/2018   MCV 94.8 04/07/2018   PLT 216.0 04/07/2018   Lab Results  Component Value Date   NA 137 04/07/2018   K 4.5 04/07/2018   CO2 24 04/07/2018   GLUCOSE 297 (H) 04/07/2018   BUN 12 04/07/2018   CREATININE 0.88 04/07/2018   BILITOT 0.7 04/07/2018   ALKPHOS 63 04/07/2018   AST 21 04/07/2018   ALT 24 04/07/2018   PROT 7.5 04/07/2018   ALBUMIN 4.7 04/07/2018   CALCIUM 9.6 04/07/2018   GFR 96.11 04/07/2018   Lab Results  Component Value Date   CHOL 147 04/07/2018   Lab Results  Component Value Date   HDL 46.90 04/07/2018   Lab Results  Component Value Date   LDLCALC 77 04/07/2018   Lab Results  Component Value Date   TRIG 117.0 04/07/2018   Lab Results  Component Value Date   CHOLHDL 3 04/07/2018   Lab Results  Component Value Date   HGBA1C 7.5 (H) 04/07/2018         Assessment & Plan:   Problem List Items Addressed This Visit    Hypertension    Well controlled, no changes to meds. Encouraged heart healthy diet such as the DASH diet and exercise as tolerated.       Relevant Medications   azaTHIOprine (IMURAN) 50 MG tablet   fenofibrate 160 MG tablet   losartan (COZAAR) 50 MG tablet   metoprolol succinate (TOPROL-XL) 50 MG 24 hr tablet   Other Relevant Orders   CBC   Comprehensive metabolic panel   TSH   Hyperlipidemia    Encouraged heart healthy diet, increase exercise, avoid trans fats, consider a krill oil cap daily      Relevant Medications   azaTHIOprine (IMURAN) 50 MG tablet   fenofibrate 160 MG tablet   losartan (COZAAR) 50 MG tablet   metoprolol succinate (TOPROL-XL) 50 MG 24 hr tablet   Other Relevant Orders   Lipid panel   Colitis, ulcerative (HCC)     No recent flare.       Uncontrolled type 2 diabetes mellitus without complication, without long-term current use of insulin (HCC)   Relevant Medications   azaTHIOprine (IMURAN) 50 MG tablet   fenofibrate 160 MG tablet   losartan (COZAAR) 50 MG tablet   metFORMIN (GLUCOPHAGE) 1000 MG tablet   metoprolol succinate (TOPROL-XL) 50 MG 24 hr tablet   Other Relevant Orders   Hemoglobin A1c   Low back pain    Encouraged moist heat and gentle stretching as tolerated. May try NSAIDs and prescription meds as directed and report if symptoms worsen or seek immediate care, using tylenol 2 tabs daily  Relevant Orders   MR Lumbar Spine Wo Contrast   Tinnitus    Left ear for several months. Sort of a buzzing. Did rupture the eardrum as a child. Referred to ENT for evaluation and warned there is likely no treatment if it is in fact tinnitus.       Relevant Orders   Ambulatory referral to ENT    Other Visit Diagnoses    High risk medication use    -  Primary   Relevant Orders   Pain Mgmt, Profile 8 w/Conf, U      I am having Margit Banda Risio "Joe" start on zaleplon. I am also having him maintain his meloxicam, tadalafil, busPIRone, sildenafil, onetouch ultrasoft, glucose blood, valACYclovir, fluticasone, albuterol, fluticasone, olopatadine, Pitavastatin Calcium, traMADol, balsalazide, guaiFENesin-codeine, levocetirizine, Dulaglutide, Insulin Detemir, fluticasone, Insulin Pen Needle, tiZANidine, glimepiride, azaTHIOprine, fenofibrate, losartan, metFORMIN, and metoprolol succinate.  Meds ordered this encounter  Medications  . azaTHIOprine (IMURAN) 50 MG tablet    Sig: TAKE 4 AND 1/2 TABLETS BY MOUTH DAILY    Dispense:  135 tablet    Refill:  1  . fenofibrate 160 MG tablet    Sig: Take 1 tablet (160 mg total) by mouth daily.    Dispense:  90 tablet    Refill:  1  . losartan (COZAAR) 50 MG tablet    Sig: Take 1 tablet (50 mg total) by mouth daily.    Dispense:  90 tablet    Refill:  1    . metFORMIN (GLUCOPHAGE) 1000 MG tablet    Sig: Take 1 tablet (1,000 mg total) by mouth 2 (two) times daily with a meal.    Dispense:  180 tablet    Refill:  1  . metoprolol succinate (TOPROL-XL) 50 MG 24 hr tablet    Sig: TAKE 1 TABLET BY MOUTH DAILY WITH OR IMMEDIATELY FOLLOWING A MEAL    Dispense:  90 tablet    Refill:  1  . zaleplon (SONATA) 10 MG capsule    Sig: Take 1 capsule (10 mg total) by mouth at bedtime as needed for sleep.    Dispense:  30 capsule    Refill:  2    CMA served as scribe during this visit. History, Physical and Plan performed by medical provider. Documentation and orders reviewed and attested to.  Penni Homans, MD

## 2018-07-08 NOTE — Assessment & Plan Note (Signed)
Encouraged heart healthy diet, increase exercise, avoid trans fats, consider a krill oil cap daily 

## 2018-07-08 NOTE — Assessment & Plan Note (Addendum)
Encouraged moist heat and gentle stretching as tolerated. May try NSAIDs and prescription meds as directed and report if symptoms worsen or seek immediate care, using tylenol 2 tabs daily

## 2018-07-10 ENCOUNTER — Ambulatory Visit: Payer: BLUE CROSS/BLUE SHIELD | Admitting: Family Medicine

## 2018-07-10 LAB — PAIN MGMT, PROFILE 8 W/CONF, U
6 Acetylmorphine: NEGATIVE ng/mL (ref <10)
ALCOHOL METABOLITES: NEGATIVE ng/mL (ref <500)
AMPHETAMINES: NEGATIVE ng/mL (ref <500)
Benzodiazepines: NEGATIVE ng/mL (ref <100)
Buprenorphine, Urine: NEGATIVE ng/mL (ref <5)
COCAINE METABOLITE: NEGATIVE ng/mL (ref <150)
CREATININE: 130.6 mg/dL
MDA: NEGATIVE ng/mL (ref <200)
MDMA: NEGATIVE ng/mL (ref <500)
MDMA: NEGATIVE ng/mL (ref <200)
Marijuana Metabolite: NEGATIVE ng/mL (ref <20)
OXIDANT: NEGATIVE ug/mL (ref <200)
OXYCODONE: NEGATIVE ng/mL (ref <100)
Opiates: NEGATIVE ng/mL (ref <100)
pH: 6.4 (ref 4.5–9.0)

## 2018-07-10 MED FILL — LEVEMIR FLEXTOUCH 100 UNITS: 100 | 54 days supply | Qty: 30 | Fill #2

## 2018-07-14 ENCOUNTER — Other Ambulatory Visit: Payer: Self-pay | Admitting: Family Medicine

## 2018-07-14 DIAGNOSIS — G8929 Other chronic pain: Secondary | ICD-10-CM

## 2018-07-14 DIAGNOSIS — M545 Low back pain: Principal | ICD-10-CM

## 2018-07-16 ENCOUNTER — Encounter: Payer: Self-pay | Admitting: Endocrinology

## 2018-07-16 ENCOUNTER — Ambulatory Visit (INDEPENDENT_AMBULATORY_CARE_PROVIDER_SITE_OTHER): Payer: BLUE CROSS/BLUE SHIELD | Admitting: Endocrinology

## 2018-07-16 VITALS — BP 122/72 | HR 92 | Ht 66.0 in | Wt 184.4 lb

## 2018-07-16 DIAGNOSIS — E1165 Type 2 diabetes mellitus with hyperglycemia: Secondary | ICD-10-CM | POA: Diagnosis not present

## 2018-07-16 DIAGNOSIS — IMO0001 Reserved for inherently not codable concepts without codable children: Secondary | ICD-10-CM

## 2018-07-16 MED ORDER — FREESTYLE LIBRE 14 DAY SENSOR MISC: 1.0000 | 3 refills | Status: DC

## 2018-07-16 MED ORDER — FREESTYLE LIBRE 14 DAY READER DEVI: 1.0000 | Freq: Once | 0 refills | Status: AC

## 2018-07-16 MED ORDER — INSULIN DETEMIR 100 UNIT/ML FLEXPEN: 70.0000 [IU] | PEN_INJECTOR | SUBCUTANEOUS | 11 refills | Status: DC

## 2018-07-16 MED FILL — FREESTYLE LIBRE 14 DAY SENS: 84 days supply | Qty: 6 | Fill #0

## 2018-07-16 MED FILL — ULTICARE PEN NDL 8MM 31G: 31G X 8 MM | 50 days supply | Qty: 100 | Fill #0

## 2018-07-16 MED FILL — FREESTYLE LIBRE 14 DAY READ: 1 days supply | Qty: 1 | Fill #0

## 2018-07-16 NOTE — Progress Notes (Signed)
Subjective:    Patient ID: Curtis Kennedy, male    DOB: Nov 06, 1965, 52 y.o.   MRN: [id number]  HPI Pt returns for f/u of diabetes mellitus: DM type: Insulin-requiring type 2 Dx'ed: 2751 Complications: none Therapy: insulin since 7001, trulicity, and 2 oral meds.   DKA: never Severe hypoglycemia: never Pancreatitis: never Pancreatic imaging: normal on 2018 [place].   Other: he seldom checks cbg's; he declines multiple daily injections.   Interval history: He brings a record of his cbg's which I have reviewed today.  It varies from 52-200.  pt states he feels well in general.   Past Medical History:  Diagnosis Date  . Anemia 07/02/2017  . Colitis, ulcerative ([place]) Dx'd 2001   Dr. Erlene Quan; [facility]--has been in remission since about 2009  . Diabetes mellitus without complication ([place]) 52 yrs old   type 2 (dx'd after long courses of prednisone for his UC)  . Erectile dysfunction   . Family history of tinea corporis 07/25/2012  . Hyperlipidemia   . Hypertension   . Insomnia   . Low back pain 08/07/2016  . Preventative health care 12/31/2016  . Right shoulder pain 12/31/2016  . Sinusitis 08/28/2015    Past Surgical History:  Procedure Laterality Date  . TRIGGER FINGER RELEASE  2011   b/l middle fingers    Social History   Socioeconomic History  . Marital status: Married    Spouse name: Not on file  . Number of children: Not on file  . Years of education: Not on file  . Highest education level: Not on file  Occupational History  . Not on file  Social Needs  . Financial resource strain: Not on file  . Food insecurity:    Worry: Not on file    Inability: Not on file  . Transportation needs:    Medical: Not on file    Non-medical: Not on file  Tobacco Use  . Smoking status: Never Smoker  . Smokeless tobacco: Never Used  Substance and Sexual Activity  . Alcohol use: No  . Drug use: No  . 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 Physical activity:    Days per week: Not on file    Minutes per session: Not on file  . Stress: Not on file  Relationships  . Social connections:    Talks on phone: Not on file    Gets together: Not on file    Attends religious service: Not on file    Active member of club or organization: Not on file    Attends meetings of clubs or organizations: Not on file    Relationship status: Not on file  . Intimate partner violence:    Fear of current or ex partner: Not on file    Emotionally abused: Not on file    Physically abused: Not on file    Forced sexual activity: Not on file  Other Topics Concern  . Not on file  Social History Narrative   Married, 54 y/o girl.     Lives in [place], grew up in [place]--relocated to [facility] 2007.   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          Current Outpatient Medications on File Prior to Visit  Medication Sig Dispense Refill  . albuterol (PROVENTIL  HFA;VENTOLIN HFA) 108 (90 Base) MCG/ACT inhaler Inhale 2 puffs into the lungs every 6 (six) hours as needed for wheezing or shortness of breath. 1 Inhaler 0  . azaTHIOprine (IMURAN) 50 MG tablet TAKE 4 AND 1/2 TABLETS BY MOUTH DAILY 135 tablet 1  . balsalazide (COLAZAL) 750 MG capsule Take 3 capsules (2,250 mg total) by mouth 3 (three) times daily. 270 capsule 3  . busPIRone (BUSPAR) 5 MG tablet Take 5 mg by mouth 2 (two) times daily.    . fenofibrate 160 MG tablet Take 1 tablet (160 mg total) by mouth daily. 90 tablet 1  . fluticasone (FLONASE) 50 MCG/ACT nasal spray Place 2 sprays into both nostrils daily. 16 g 5  . fluticasone (FLOVENT HFA) 110 MCG/ACT inhaler Inhale 2 puffs into the lungs 2 (two) times daily. 1 Inhaler 2  . fluticasone (FLOVENT HFA) 110 MCG/ACT inhaler Inhale 2 puffs into the lungs 2 (two) times daily. 1 Inhaler 2  . glucose blood (ACCU-CHEK AVIVA PLUS) test strip Use  twice daily to check blood sugar.  DX E11.9 100 each 4  . Insulin Pen Needle (BD PEN NEEDLE NANO U/F) 32G X 4 MM MISC USE ONE NEEDLE DAILY 100 each 1  . Lancets (ONETOUCH ULTRASOFT) lancets Use as directed twice daily to check blood sugar.  DX E11.9 100 each 6  . levocetirizine (XYZAL) 5 MG tablet Take 1 tablet (5 mg total) by mouth every evening. 90 tablet 3  . losartan (COZAAR) 50 MG tablet Take 1 tablet (50 mg total) by mouth daily. 90 tablet 1  . meloxicam (MOBIC) 15 MG tablet Take 15 mg by mouth daily.    . metFORMIN (GLUCOPHAGE) 1000 MG tablet Take 1 tablet (1,000 mg total) by mouth 2 (two) times daily with a meal. 180 tablet 1  . metoprolol succinate (TOPROL-XL) 50 MG 24 hr tablet TAKE 1 TABLET BY MOUTH DAILY WITH OR IMMEDIATELY FOLLOWING A MEAL 90 tablet 1  . olopatadine (PATANOL) 0.1 % ophthalmic solution Place 1 drop into both eyes 2 (two) times daily. 5 mL 3  . Pitavastatin Calcium (LIVALO) 4 MG TABS Take 1 tablet (4 mg total) by mouth daily. 30 tablet 6  . sildenafil (REVATIO) 20 MG tablet 1 tab po 2-5 tabs po as needed for sexual activity. 50 tablet 0  . tadalafil (CIALIS) 5 MG tablet Take 5 mg by mouth daily as needed.    Marland Kitchen tiZANidine (ZANAFLEX) 4 MG tablet Take 1 tablet (4 mg total) by mouth every 6 (six) hours as needed for muscle spasms. 30 tablet 0  . traMADol (ULTRAM) 50 MG tablet Take 1 tablet (50 mg total) by mouth every 8 (eight) hours as needed. 60 tablet 0  . valACYclovir (VALTREX) 1000 MG tablet Take 1 tablet (1,000 mg total) by mouth 2 (two) times daily as needed. 30 tablet 6  . zaleplon (SONATA) 10 MG capsule Take 1 capsule (10 mg total) by mouth at bedtime as needed for sleep. 30 capsule 2   No current facility-administered medications on file prior to visit.     Allergies  Allergen Reactions  . Simvastatin     Myalgias    Family History  Problem Relation Age of Onset  . Diabetes Father   . Hyperlipidemia Father   . Hypertension Father   . Kidney disease  Father        Kidney Failure  . Heart disease Father 24       MI  . Hypertension Sister   .  Hypertension Brother   . Diabetes Paternal Grandfather   . Hypertension Brother   . Hyperlipidemia Brother   . Heart disease Brother        Valve Surgery ?  . Diabetes Brother   . Hypertension Brother   . ADD / ADHD Brother     BP 122/72 (BP Location: Left Arm, Patient Position: Sitting)   Pulse 92   Ht 5' 6"  (1.676 m)   Wt 184 lb 6.4 oz (83.6 kg)   SpO2 98%   BMI 29.76 kg/m    Review of Systems Denies LOC.      Objective:   Physical Exam VITAL SIGNS:  See vs page GENERAL: no distress Pulses: foot pulses are intact bilaterally.   MSK: no deformity of the feet or ankles.  CV: no edema of the legs or ankles Skin:  no ulcer on the feet or ankles.  normal color and temp on the feet and ankles.   Neuro: sensation is intact to touch on the feet and ankles.    Lab Results  Component Value Date   HGBA1C 8.4 (H) 07/08/2018       Assessment & Plan:  Insulin-requiring type 2 DM: worse.  Hypoglycemia: this limits aggressiveness of glycemic control.   Patient Instructions  check your blood sugar once a day.  vary the time of day when you check, between before the 3 meals, and at bedtime.  also check if you have symptoms of your blood sugar being too high or too low.  please keep a record of the readings and bring it to your next appointment here (or you can bring the meter itself).  You can write it on any piece of paper.  please call us sooner if your blood sugar goes below 70, or if you have a lot of readings over 200.  For now, please: increase the levemir to 70 units each morning, and:  Stop taking the glimepiride, and: Please continue the same other diabetes medications. Please come back for a follow-up appointment in 2 months.

## 2018-07-16 NOTE — Patient Instructions (Addendum)
check your blood sugar once a day.  vary the time of day when you check, between before the 3 meals, and at bedtime.  also check if you have symptoms of your blood sugar being too high or too low.  please keep a record of the readings and bring it to your next appointment here (or you can bring the meter itself).  You can write it on any piece of paper.  please call us sooner if your blood sugar goes below 70, or if you have a lot of readings over 200.  For now, please: increase the levemir to 70 units each morning, and:  Stop taking the glimepiride, and: Please continue the same other diabetes medications. Please come back for a follow-up appointment in 2 months.

## 2018-07-18 ENCOUNTER — Ambulatory Visit (INDEPENDENT_AMBULATORY_CARE_PROVIDER_SITE_OTHER): Payer: BLUE CROSS/BLUE SHIELD | Admitting: Podiatry

## 2018-07-18 ENCOUNTER — Encounter: Payer: Self-pay | Admitting: Endocrinology

## 2018-07-18 DIAGNOSIS — E119 Type 2 diabetes mellitus without complications: Secondary | ICD-10-CM

## 2018-07-18 MED ORDER — DULAGLUTIDE 1.5 MG/0.5ML ~~LOC~~ SOAJ: 1.5000 mg | SUBCUTANEOUS | 3 refills | Status: DC

## 2018-07-18 MED FILL — TRULICITY 1.5 MG/0.5 ML PEN: 1.5 | 84 days supply | Qty: 6 | Fill #0

## 2018-07-18 NOTE — Patient Instructions (Signed)

## 2018-07-20 NOTE — Progress Notes (Signed)
Subjective: Curtis Kennedy presents the office today for yearly diabetic foot evaluation.  He states that overall he is doing well does not have any issues to his feet.  He denies any open sores, claudication symptoms, numbness or tingling.  His A1c is 7.4.  He has no concerns to his feet today.  Objective: General: AAO x3, NAD  Dermatological: Skin is warm, dry and supple bilateral. Nails x 10 are well manicured; remaining integument appears unremarkable at this time. There are no open sores, no preulcerative lesions, no rash or signs of infection present.  Vascular: Dorsalis Pedis artery and Posterior Tibial artery pedal pulses are 2/4 bilateral with immedate capillary fill time. Pedal hair growth present. No varicosities and no lower extremity edema present bilateral. There is no pain with calf compression, swelling, warmth, erythema.   Neruologic: Grossly intact via light touch bilateral. Vibratory intact via tuning fork bilateral. Protective threshold with Semmes Wienstein monofilament intact to all pedal sites bilateral.   Musculoskeletal: Mild arthritic changes to the right hallux IPJ, currently no pain or swelling. No pain, crepitus, or limitation noted with foot and ankle range of motion bilateral. Muscular strength 5/5 in all groups tested bilateral.  Gait: Unassisted, Nonantalgic.    Assessment: Diabetic foot evaluation, doing well  Plan: -All treatment options discussed with the patient including all alternatives, risks, complications.  -At this point he is doing well, diabetic foot standpoint. Discussed with him daily foot inspection. -RTC 1 year but encouraged to call this any changes.  -Patient encouraged to call the office with any questions, concerns, change in symptoms.   Celesta Gentile, DPM

## 2018-07-31 ENCOUNTER — Encounter (INDEPENDENT_AMBULATORY_CARE_PROVIDER_SITE_OTHER): Payer: Self-pay | Admitting: Orthopaedic Surgery

## 2018-07-31 ENCOUNTER — Ambulatory Visit (INDEPENDENT_AMBULATORY_CARE_PROVIDER_SITE_OTHER): Payer: BLUE CROSS/BLUE SHIELD | Admitting: Orthopaedic Surgery

## 2018-07-31 ENCOUNTER — Ambulatory Visit (INDEPENDENT_AMBULATORY_CARE_PROVIDER_SITE_OTHER): Payer: Self-pay

## 2018-07-31 DIAGNOSIS — M545 Low back pain, unspecified: Secondary | ICD-10-CM

## 2018-07-31 DIAGNOSIS — G8929 Other chronic pain: Secondary | ICD-10-CM

## 2018-07-31 MED ORDER — METHOCARBAMOL 500 MG PO TABS: 500.0000 mg | ORAL_TABLET | Freq: Two times a day (BID) | ORAL | 0 refills | Status: DC | PRN

## 2018-07-31 MED ORDER — METHYLPREDNISOLONE 4 MG PO TBPK: ORAL_TABLET | ORAL | 0 refills | Status: DC

## 2018-07-31 NOTE — Progress Notes (Signed)
Office Visit Note   Patient: Curtis Kennedy           Date of Birth: [birth year]           MRN: [id number] Visit Date: 07/31/2018              Requested by: Mosie Lukes, MD [address] [place], [place] [address] PCP: Mosie Lukes, MD   Assessment & Plan: Visit Diagnoses:  1. Chronic low back pain, unspecified back pain laterality, unspecified whether sciatica present     Plan: Impression is chronic lumbar pain.  Because this is been ongoing for the past 30+ years and the patient has failed all conservative treatment options, we will proceed with an MRI to assess for structural abnormalities.  He will follow-up with [place] once that has been completed.  In the meantime, I will call in a methylprednisolone taper as well as Robaxin.  He will be extra vigilant with taking his blood sugars while on the steroid.  Follow-Up Instructions: Return in about 10 days (around 08/10/2018).   Orders:  Orders Placed This Encounter  Procedures  . XR Lumbar Spine 2-3 Views  . MR Lumbar Spine w/o contrast   Meds ordered this encounter  Medications  . methocarbamol (ROBAXIN) 500 MG tablet    Sig: Take 1 tablet (500 mg total) by mouth 2 (two) times daily as needed for muscle spasms.    Dispense:  30 tablet    Refill:  0  . methylPREDNISolone (MEDROL DOSEPAK) 4 MG TBPK tablet    Sig: Take as directed    Dispense:  21 tablet    Refill:  0      Procedures: No procedures performed   Clinical Data: No additional findings.   Subjective: Chief Complaint  Patient presents with  . Lower Back - Pain    HPI patient is a pleasant 52 year old gentleman who presents to our clinic today with lower back pain.  This began approximately 30 years ago when picking up a heavy-year-old and shaking it out.  His pain has gradually worsened over the past 3 decades.  Pain he has is to both sides of the lower back.  Worse with cold weather, rain and banding too long with time.  Over the  years, he has been to a chiropractor as well as formal physical therapy where he continues to do a stretching program on his own.  These were initially helpful, but have no longer been of relief.  He has been taking tramadol as well as Tylenol with minimal relief.  He is a diabetic and has underlying ulcerative colitis (in remission), so is very hesitant with medications.  No previous MRI or epidural steroid injection.  No bowel or bladder change and no saddle paresthesias.  Review of Systems as detailed in HPI.  All others reviewed and are negative.   Objective: Vital Signs: There were no vitals taken for this visit.  Physical Exam well-developed well-nourished gentleman in no acute distress.  Alert and oriented x3.  Ortho Exam examination of his back reveals minimal tenderness through the thoracic spine.  He does have slight paraspinous tenderness to the lower lumbar spine.  Increased pain with lumbar flexion.  No pain with lumbar extension.  Minimally positive straight leg raise left greater than right.  Negative logroll.  No focal weakness.  He is neurovascularly intact distally.  Specialty Comments:  No specialty comments available.  Imaging: Xr Lumbar Spine 2-3 Views  Result Date: 07/31/2018 Moderate degenerative disc disease at T11-12, T12-L1    PMFS History: Patient Active Problem List   Diagnosis Date Noted  . Tinnitus 07/08/2018  . Fatty liver disease, nonalcoholic 92/95/7473  . Ganglion cyst 07/19/2017  . Anemia 07/02/2017  . Right shoulder pain 12/31/2016  . Trigger finger of left hand 12/31/2016  . Preventative health care 12/31/2016  . Low back pain 08/07/2016  . Encounter for diabetic foot exam ([place]) 07/17/2016  . Uncontrolled type 2 diabetes mellitus without complication, without long-term current use of insulin ([place]) 08/31/2015  . Sinusitis 08/28/2015  . Axillary mass 04/28/2015  . Insomnia   . Tinea corporis 07/25/2012  . Erectile dysfunction   .  Hypertension   . Hyperlipidemia   . Colitis, ulcerative ([place])    Past Medical History:  Diagnosis Date  . Anemia 07/02/2017  . Colitis, ulcerative ([place]) Dx'd 2001   Dr. Erlene Quan; [facility]--has been in remission since about 2009  . Diabetes mellitus without complication ([place]) 52 yrs old   type 2 (dx'd after long courses of prednisone for his UC)  . Erectile dysfunction   . Family history of tinea corporis 07/25/2012  . Hyperlipidemia   . Hypertension   . Insomnia   . Low back pain 08/07/2016  . Preventative health care 12/31/2016  . Right shoulder pain 12/31/2016  . Sinusitis 08/28/2015    Family History  Problem Relation Age of Onset  . Diabetes Father   . Hyperlipidemia Father   . Hypertension Father   . Kidney disease Father        Kidney Failure  . Heart disease Father 27       MI  . Hypertension Sister   . Hypertension Brother   . Diabetes Paternal Grandfather   . Hypertension Brother   . Hyperlipidemia Brother   . Heart disease Brother        Valve Surgery ?  . Diabetes Brother   . Hypertension Brother   . ADD / ADHD Brother     Past Surgical History:  Procedure Laterality Date  . TRIGGER FINGER RELEASE  2011   b/l middle fingers   Social History   Occupational History  . Not on file  Tobacco Use  . Smoking status: Never Smoker  . Smokeless tobacco: Never Used  Substance and Sexual Activity  . Alcohol use: No  . Drug use: No  . 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

## 2018-08-06 MED FILL — LEVEMIR FLEXTOUCH 100 UNITS: 100 | 43 days supply | Qty: 30 | Fill #0

## 2018-08-07 ENCOUNTER — Other Ambulatory Visit: Payer: Self-pay | Admitting: Family Medicine

## 2018-08-07 ENCOUNTER — Encounter (INDEPENDENT_AMBULATORY_CARE_PROVIDER_SITE_OTHER): Payer: Self-pay | Admitting: Orthopaedic Surgery

## 2018-08-07 DIAGNOSIS — E7849 Other hyperlipidemia: Secondary | ICD-10-CM

## 2018-08-07 DIAGNOSIS — IMO0001 Reserved for inherently not codable concepts without codable children: Secondary | ICD-10-CM

## 2018-08-07 DIAGNOSIS — I10 Essential (primary) hypertension: Secondary | ICD-10-CM

## 2018-08-07 DIAGNOSIS — E785 Hyperlipidemia, unspecified: Secondary | ICD-10-CM

## 2018-08-07 DIAGNOSIS — E1165 Type 2 diabetes mellitus with hyperglycemia: Secondary | ICD-10-CM

## 2018-08-14 ENCOUNTER — Other Ambulatory Visit: Payer: Self-pay | Admitting: Family Medicine

## 2018-08-14 DIAGNOSIS — I10 Essential (primary) hypertension: Secondary | ICD-10-CM

## 2018-08-14 DIAGNOSIS — IMO0001 Reserved for inherently not codable concepts without codable children: Secondary | ICD-10-CM

## 2018-08-14 DIAGNOSIS — E1165 Type 2 diabetes mellitus with hyperglycemia: Secondary | ICD-10-CM

## 2018-08-14 DIAGNOSIS — E7849 Other hyperlipidemia: Secondary | ICD-10-CM

## 2018-08-26 ENCOUNTER — Ambulatory Visit (INDEPENDENT_AMBULATORY_CARE_PROVIDER_SITE_OTHER): Payer: BLUE CROSS/BLUE SHIELD | Admitting: Orthopaedic Surgery

## 2018-08-26 IMAGING — US US ABDOMEN COMPLETE
1 series · 13 of 25 positions shown · non-contrast
Comparison: None

CLINICAL DATA: Dyspepsia, abdominal pain, hypertension, diabetes
mellitus, hyperlipidemia, history ulcer colitis

EXAM:
ABDOMEN ULTRASOUND COMPLETE

[Series 1: us abdomen complete · 0.32mm/px · 13 of 94 slices shown]
[im 1/94]
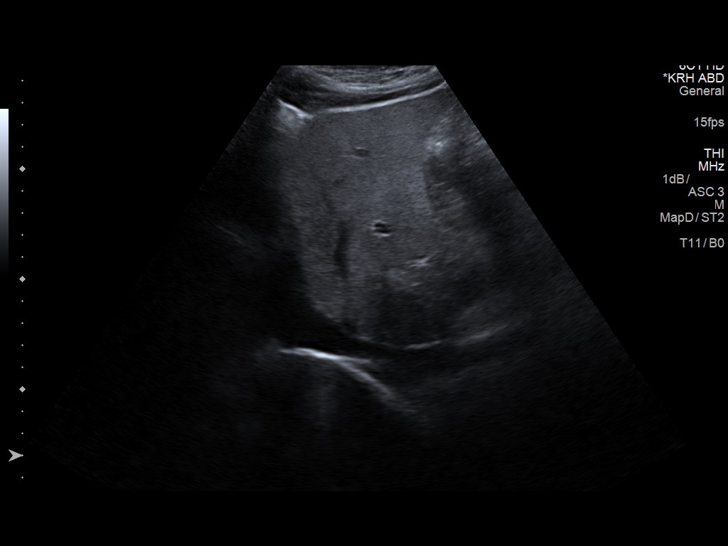
[im 8/94]
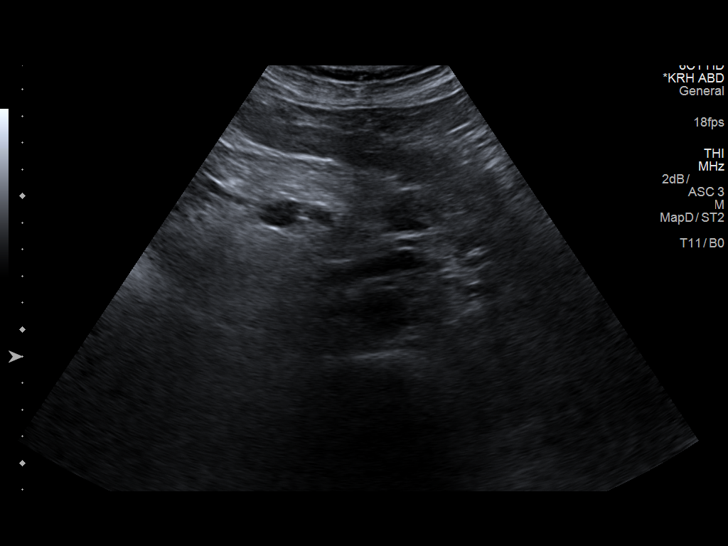
[im 16/94]
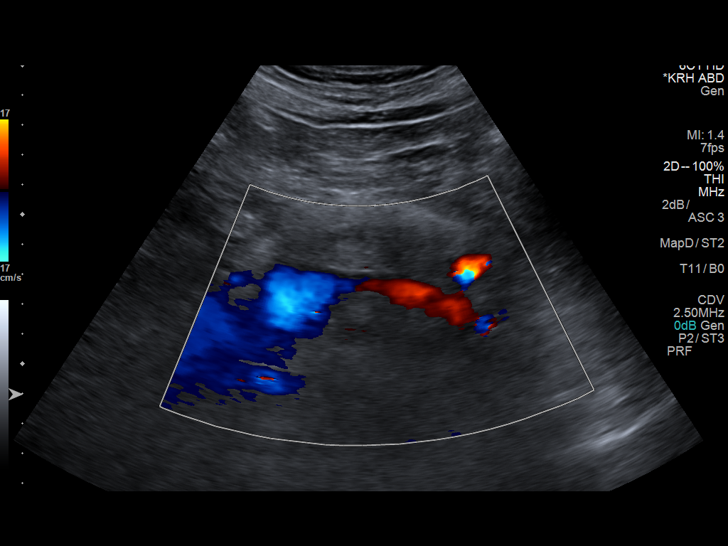
[im 24/94]
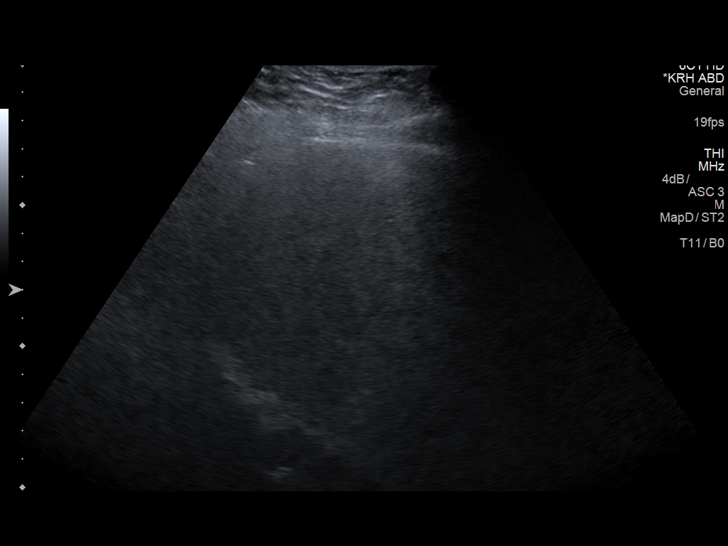
[im 32/94]
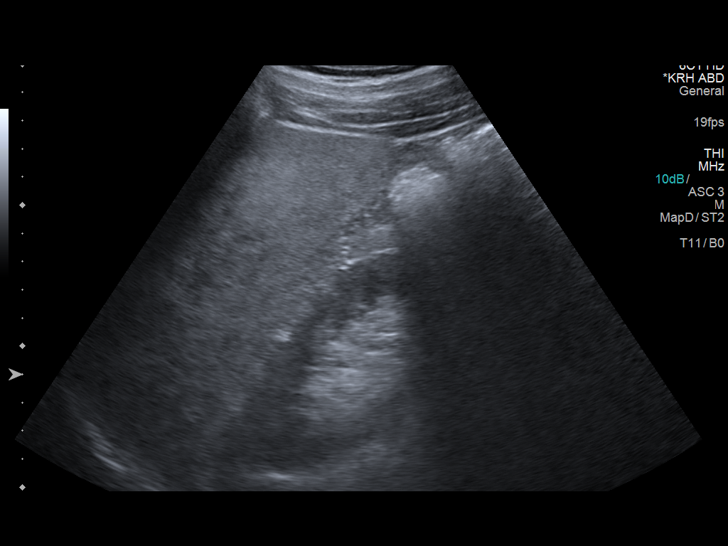
[im 39/94]
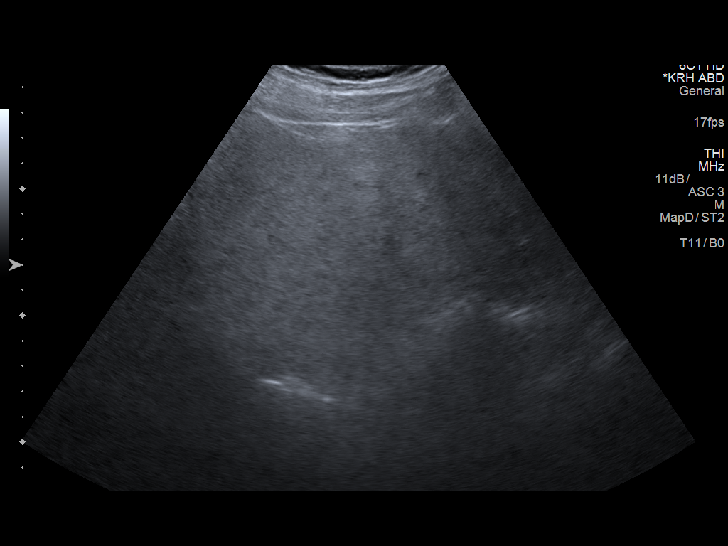
[im 47/94]
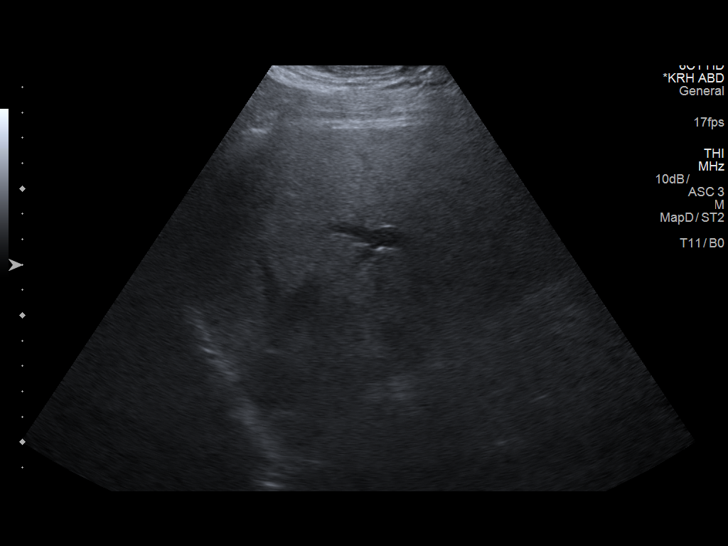
[im 55/94]
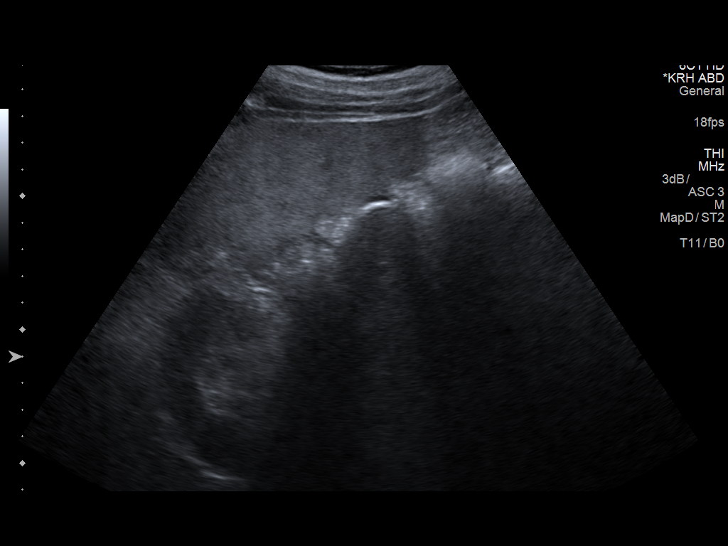
[im 63/94]
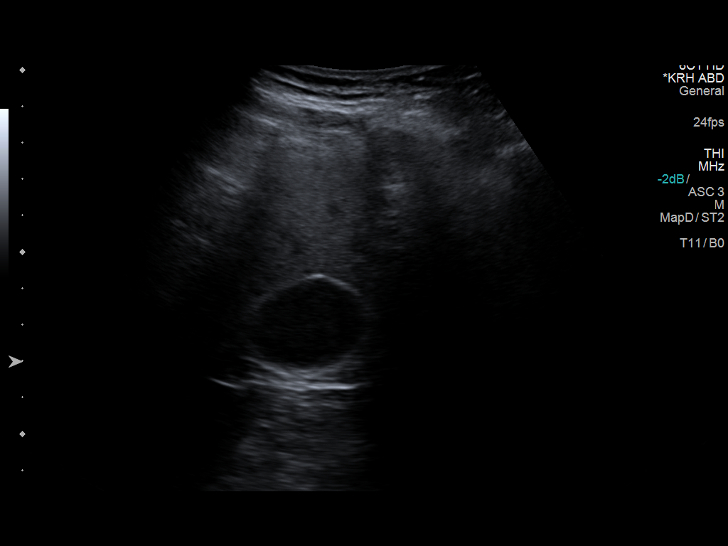
[im 70/94]
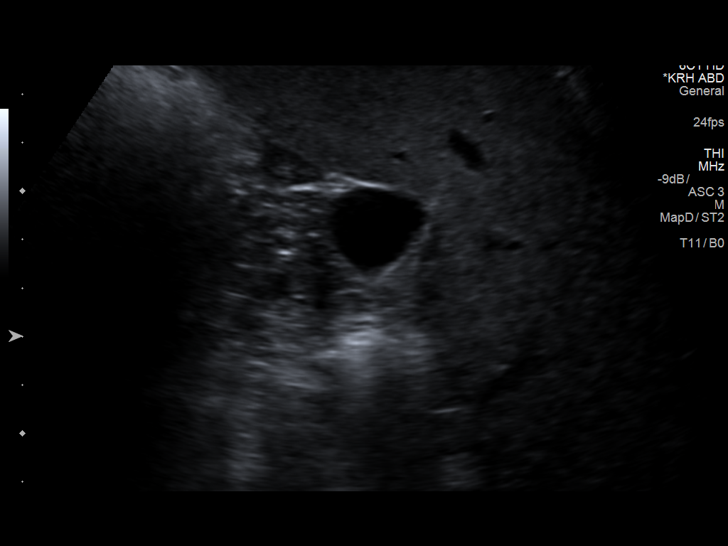
[im 78/94]
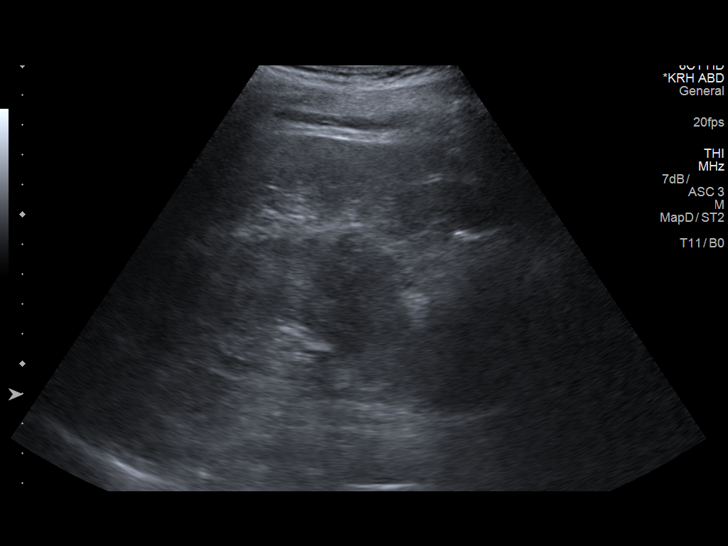
[im 86/94]
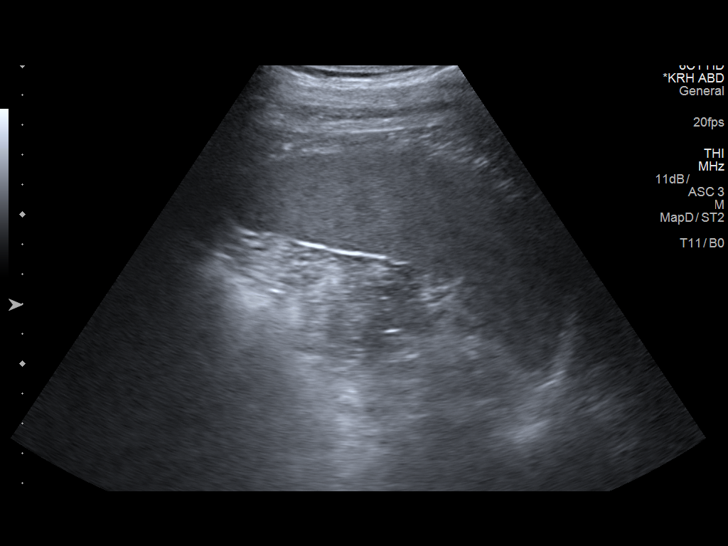
[im 94/94]
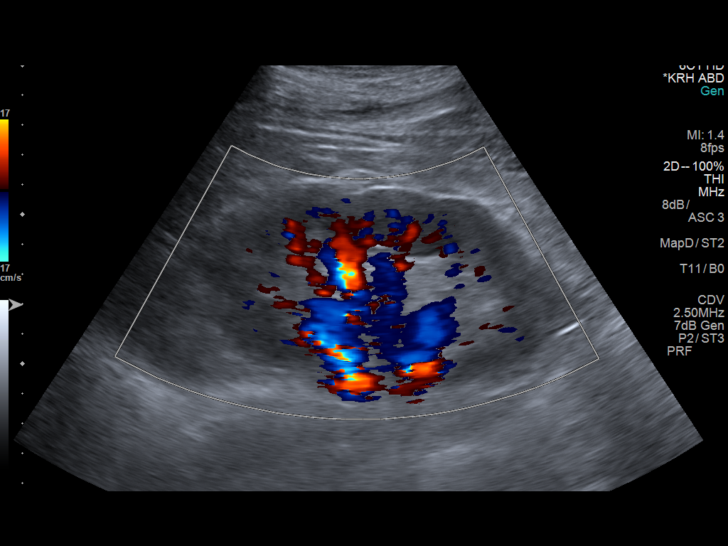

[13 of 25 positions shown; findings below may reference images not displayed]

FINDINGS: Gallbladder: Normally distended without stones or wall thickening.
No pericholecystic fluid or sonographic Murphy sign.

Common bile duct: Diameter: Normal caliber 5 mm diameter

Liver: Echogenic parenchyma, likely fatty infiltration though this
can be seen with cirrhosis and certain infiltrative disorders. Area
of hypoechogenicity adjacent to gallbladder fossa question focal
sparing. No additional mass lesion. Portal vein is patent on color
Doppler imaging with normal direction of blood flow towards the
liver.

IVC: Normal appearance

Pancreas: Normal appearance

Spleen: Normal echogenicity, 14.2 cm length with calculated volume
531 mL. No focal mass.

Right Kidney: Length: 11.9 cm. Normal morphology without mass or
hydronephrosis.

Left Kidney: Length: 11.4 cm. Normal morphology without mass or
hydronephrosis.

Abdominal aorta: Normal caliber

Other findings: No free fluid
IMPRESSION: Question fatty infiltration of liver with an area of focal sparing
adjacent to gallbladder fossa.

Minimal splenic enlargement.

Remainder of exam unremarkable.

## 2018-08-29 ENCOUNTER — Encounter: Payer: Self-pay | Admitting: Family Medicine

## 2018-08-30 ENCOUNTER — Other Ambulatory Visit: Payer: Self-pay | Admitting: Family Medicine

## 2018-08-30 MED ORDER — AMOXICILLIN 500 MG PO CAPS: 500.0000 mg | ORAL_CAPSULE | Freq: Three times a day (TID) | ORAL | 0 refills | Status: DC

## 2018-09-02 ENCOUNTER — Ambulatory Visit: Payer: BLUE CROSS/BLUE SHIELD | Admitting: Medical

## 2018-09-02 ENCOUNTER — Other Ambulatory Visit: Payer: BLUE CROSS/BLUE SHIELD

## 2018-09-04 ENCOUNTER — Other Ambulatory Visit: Payer: Self-pay | Admitting: Family Medicine

## 2018-09-04 DIAGNOSIS — E7849 Other hyperlipidemia: Secondary | ICD-10-CM

## 2018-09-04 DIAGNOSIS — I10 Essential (primary) hypertension: Secondary | ICD-10-CM

## 2018-09-04 DIAGNOSIS — IMO0001 Reserved for inherently not codable concepts without codable children: Secondary | ICD-10-CM

## 2018-09-04 DIAGNOSIS — E1165 Type 2 diabetes mellitus with hyperglycemia: Secondary | ICD-10-CM

## 2018-09-09 ENCOUNTER — Ambulatory Visit (INDEPENDENT_AMBULATORY_CARE_PROVIDER_SITE_OTHER): Payer: BLUE CROSS/BLUE SHIELD | Admitting: Orthopaedic Surgery

## 2018-09-16 ENCOUNTER — Ambulatory Visit (INDEPENDENT_AMBULATORY_CARE_PROVIDER_SITE_OTHER): Payer: BLUE CROSS/BLUE SHIELD | Admitting: Orthopaedic Surgery

## 2018-09-16 ENCOUNTER — Encounter (INDEPENDENT_AMBULATORY_CARE_PROVIDER_SITE_OTHER): Payer: Self-pay | Admitting: Orthopaedic Surgery

## 2018-09-16 DIAGNOSIS — M545 Low back pain: Secondary | ICD-10-CM

## 2018-09-16 DIAGNOSIS — G8929 Other chronic pain: Secondary | ICD-10-CM | POA: Diagnosis not present

## 2018-09-16 MED ORDER — METHOCARBAMOL 500 MG PO TABS: 500.0000 mg | ORAL_TABLET | Freq: Four times a day (QID) | ORAL | 2 refills | Status: AC | PRN

## 2018-09-16 NOTE — Progress Notes (Signed)
Office Visit Note   Patient: Curtis Kennedy           Date of Birth: Jun 22, 1966           MRN: [id number] Visit Date: 09/16/2018              Requested by: Mosie Lukes, MD [address] [place], [place] [address] PCP: Mosie Lukes, MD   Assessment & Plan: Visit Diagnoses:  1. Chronic low back pain, unspecified back pain laterality, unspecified whether sciatica present     Plan: MRI findings are consistent with multilevel degenerative findings of the lumbar spine with facet hypertrophy and ligamentum flavum thickening and broad-based disc bulging with mild canal stenosis at L3-4.  No findings to suggest radiculopathy.  Patient is overall doing okay.  I refilled his Robaxin which is really helped in the past.  Referral to outpatient physical therapy.  Questions encouraged and answered.  Follow-up as needed.  Follow-Up Instructions: Return if symptoms worsen or fail to improve.   Orders:  No orders of the defined types were placed in this encounter.  Meds ordered this encounter  Medications  . methocarbamol (ROBAXIN) 500 MG tablet    Sig: Take 1 tablet (500 mg total) by mouth every 6 (six) hours as needed for muscle spasms.    Dispense:  60 tablet    Refill:  2      Procedures: No procedures performed   Clinical Data: No additional findings.   Subjective: Chief Complaint  Patient presents with  . Lower Back - Pain    Curtis Kennedy follows up today for lumbar spine MRI review.  He states that he is at his baseline level of back pain.   Review of Systems  Constitutional: Negative.   All other systems reviewed and are negative.    Objective: Vital Signs: There were no vitals taken for this visit.  Physical Exam  Constitutional: He is oriented to person, place, and time. He appears well-developed and well-nourished.  Pulmonary/Chest: Effort normal.  Abdominal: Soft.  Neurological: He is alert and oriented to person, place, and time.  Skin: Skin  is warm.  Psychiatric: He has a normal mood and affect. His behavior is normal. Judgment and thought content normal.  Nursing note and vitals reviewed.   Ortho Exam Lumbar spine exam is stable.  No evidence of radiculopathy. Specialty Comments:  No specialty comments available.  Imaging: No results found.   PMFS History: Patient Active Problem List   Diagnosis Date Noted  . Tinnitus 07/08/2018  . Fatty liver disease, nonalcoholic 15/17/6160  . Ganglion cyst 07/19/2017  . Anemia 07/02/2017  . Right shoulder pain 12/31/2016  . Trigger finger of left hand 12/31/2016  . Preventative health care 12/31/2016  . Low back pain 08/07/2016  . Encounter for diabetic foot exam ([place]) 07/17/2016  . Uncontrolled type 2 diabetes mellitus without complication, without long-term current use of insulin ([place]) 08/31/2015  . Sinusitis 08/28/2015  . Axillary mass 04/28/2015  . Insomnia   . Tinea corporis 07/25/2012  . Erectile dysfunction   . Hypertension   . Hyperlipidemia   . Colitis, ulcerative ([place])    Past Medical History:  Diagnosis Date  . Anemia 07/02/2017  . Colitis, ulcerative ([place]) Dx'd 2001   Dr. Erlene Quan; [facility]--has been in remission since about 2009  . Diabetes mellitus without complication ([place]) 53 yrs old   type 2 (dx'd after long courses of prednisone for his UC)  .  Erectile dysfunction   . Family history of tinea corporis 07/25/2012  . Hyperlipidemia   . Hypertension   . Insomnia   . Low back pain 08/07/2016  . Preventative health care 12/31/2016  . Right shoulder pain 12/31/2016  . Sinusitis 08/28/2015    Family History  Problem Relation Age of Onset  . Diabetes Father   . Hyperlipidemia Father   . Hypertension Father   . Kidney disease Father        Kidney Failure  . Heart disease Father 65       MI  . Hypertension Sister   . Hypertension Brother   . Diabetes Paternal Grandfather   . Hypertension Brother   . Hyperlipidemia Brother   . Heart disease  Brother        Valve Surgery ?  . Diabetes Brother   . Hypertension Brother   . ADD / ADHD Brother     Past Surgical History:  Procedure Laterality Date  . TRIGGER FINGER RELEASE  2011   b/l middle fingers   Social History   Occupational History  . Not on file  Tobacco Use  . Smoking status: Never Smoker  . Smokeless tobacco: Never Used  Substance and Sexual Activity  . Alcohol use: No  . Drug use: No  . 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

## 2018-09-17 ENCOUNTER — Ambulatory Visit: Payer: BLUE CROSS/BLUE SHIELD | Admitting: Endocrinology

## 2018-09-23 MED FILL — LEVEMIR FLEXTOUCH 100 UNITS: 100 | 43 days supply | Qty: 30 | Fill #1

## 2018-09-24 ENCOUNTER — Other Ambulatory Visit: Payer: Self-pay | Admitting: Family Medicine

## 2018-09-24 MED FILL — TRULICITY 1.5 MG/0.5 ML PEN: 1.5 | 84 days supply | Qty: 6 | Fill #1

## 2018-09-27 ENCOUNTER — Other Ambulatory Visit: Payer: Self-pay | Admitting: Family Medicine

## 2018-09-27 DIAGNOSIS — IMO0001 Reserved for inherently not codable concepts without codable children: Secondary | ICD-10-CM

## 2018-09-27 DIAGNOSIS — E1165 Type 2 diabetes mellitus with hyperglycemia: Secondary | ICD-10-CM

## 2018-09-27 DIAGNOSIS — I10 Essential (primary) hypertension: Secondary | ICD-10-CM

## 2018-09-27 DIAGNOSIS — E785 Hyperlipidemia, unspecified: Secondary | ICD-10-CM

## 2018-09-29 ENCOUNTER — Other Ambulatory Visit: Payer: Self-pay | Admitting: Family Medicine

## 2018-09-29 DIAGNOSIS — E785 Hyperlipidemia, unspecified: Secondary | ICD-10-CM

## 2018-09-29 DIAGNOSIS — I10 Essential (primary) hypertension: Secondary | ICD-10-CM

## 2018-09-29 DIAGNOSIS — E1165 Type 2 diabetes mellitus with hyperglycemia: Secondary | ICD-10-CM

## 2018-09-29 DIAGNOSIS — IMO0001 Reserved for inherently not codable concepts without codable children: Secondary | ICD-10-CM

## 2018-10-01 MED FILL — FREESTYLE LIBRE 14 DAY SENS: 84 days supply | Qty: 6 | Fill #1

## 2018-11-07 ENCOUNTER — Encounter: Payer: BLUE CROSS/BLUE SHIELD | Admitting: Family Medicine

## 2018-11-08 ENCOUNTER — Other Ambulatory Visit: Payer: Self-pay | Admitting: Family Medicine

## 2018-11-13 ENCOUNTER — Encounter: Payer: Self-pay | Admitting: Family Medicine

## 2018-11-13 ENCOUNTER — Telehealth: Payer: Self-pay

## 2018-11-13 DIAGNOSIS — Z0279 Encounter for issue of other medical certificate: Secondary | ICD-10-CM

## 2018-11-13 NOTE — Telephone Encounter (Signed)
PA initiated via Covermymeds; KEY: [id number]. Awaiting determination.

## 2018-11-14 MED FILL — LEVEMIR FLEXTOUCH 100 UNITS: 100 | 43 days supply | Qty: 30 | Fill #2

## 2018-11-17 NOTE — Telephone Encounter (Signed)
PA approved. Effective 11/13/2018 to 11/11/2021.

## 2018-11-20 ENCOUNTER — Ambulatory Visit: Payer: BLUE CROSS/BLUE SHIELD | Admitting: Endocrinology

## 2018-11-26 ENCOUNTER — Other Ambulatory Visit: Payer: Self-pay | Admitting: Family Medicine

## 2018-11-26 DIAGNOSIS — I10 Essential (primary) hypertension: Secondary | ICD-10-CM

## 2018-11-26 DIAGNOSIS — E785 Hyperlipidemia, unspecified: Secondary | ICD-10-CM

## 2018-11-26 DIAGNOSIS — E1165 Type 2 diabetes mellitus with hyperglycemia: Secondary | ICD-10-CM

## 2018-11-26 DIAGNOSIS — IMO0001 Reserved for inherently not codable concepts without codable children: Secondary | ICD-10-CM

## 2018-12-15 MED FILL — TRULICITY 1.5 MG/0.5 ML PEN: 1.5 | 84 days supply | Qty: 6 | Fill #2

## 2018-12-22 MED FILL — LEVEMIR FLEXTOUCH 100 UNITS: 100 | 43 days supply | Qty: 30 | Fill #3

## 2019-01-27 MED FILL — LEVEMIR FLEXTOUCH 100 UNITS: 100 | 43 days supply | Qty: 30 | Fill #4

## 2019-03-04 MED FILL — LEVEMIR FLEXTOUCH 100 UNITS: 100 | 43 days supply | Qty: 30 | Fill #5

## 2019-03-10 ENCOUNTER — Other Ambulatory Visit: Payer: Self-pay | Admitting: Family Medicine

## 2019-03-10 DIAGNOSIS — IMO0001 Reserved for inherently not codable concepts without codable children: Secondary | ICD-10-CM

## 2019-03-10 DIAGNOSIS — E7849 Other hyperlipidemia: Secondary | ICD-10-CM

## 2019-03-10 DIAGNOSIS — I10 Essential (primary) hypertension: Secondary | ICD-10-CM

## 2019-03-16 MED FILL — FREESTYLE LIBRE 14 DAY SENS: 84 days supply | Qty: 6 | Fill #2

## 2019-03-18 MED FILL — TRULICITY 1.5 MG/0.5 ML PEN: 1.5 | 84 days supply | Qty: 6 | Fill #3

## 2019-03-18 MED FILL — ULTICARE PEN NDL 8MM 31G: 31G X 8 MM | 50 days supply | Qty: 100 | Fill #1

## 2019-04-15 MED FILL — LEVEMIR FLEXTOUCH 100 UNITS: 100 | 43 days supply | Qty: 30 | Fill #6

## 2019-05-25 MED FILL — LEVEMIR FLEXTOUCH 100 UNITS: 100 | 43 days supply | Qty: 30 | Fill #7

## 2019-05-26 MED FILL — TRULICITY 1.5 MG/0.5 ML PEN: 1.5 | 30 days supply | Qty: 2 | Fill #0

## 2019-06-03 MED FILL — FREESTYLE LIBRE 14 DAY SENS: 84 days supply | Qty: 6 | Fill #3

## 2019-07-13 MED FILL — LEVEMIR FLEXTOUCH 100 UNITS: 100 | 43 days supply | Qty: 30 | Fill #8

## 2019-07-15 MED FILL — TRULICITY 1.5 MG/0.5 ML PEN: 1.5 | 30 days supply | Qty: 2 | Fill #1

## 2019-08-11 MED FILL — TRULICITY 1.5 MG/0.5 ML PEN: 1.5 | 30 days supply | Qty: 2 | Fill #2

## 2019-08-17 MED FILL — LEVEMIR FLEXTOUCH 100 UNITS: 100 | 43 days supply | Qty: 30 | Fill #0

## 2019-09-04 MED FILL — TRULICITY 1.5 MG/0.5 ML PEN: 1.5 | 30 days supply | Qty: 2 | Fill #3

## 2019-10-22 ENCOUNTER — Telehealth: Payer: Self-pay

## 2019-10-22 NOTE — Telephone Encounter (Signed)
Copied from [place] [id number]. Topic: General - Other >> Oct 22, 2019  9:22 AM Keene Breath wrote: Reason for CRM: Patient called to give new insurance information for his insurance.  CB# [phone]

## 2019-11-05 ENCOUNTER — Ambulatory Visit: Payer: 59 | Admitting: Family Medicine

## 2019-11-05 ENCOUNTER — Other Ambulatory Visit: Payer: Self-pay

## 2019-11-05 ENCOUNTER — Encounter: Payer: Self-pay | Admitting: Family Medicine

## 2019-11-05 DIAGNOSIS — E782 Mixed hyperlipidemia: Secondary | ICD-10-CM | POA: Diagnosis not present

## 2019-11-05 DIAGNOSIS — K760 Fatty (change of) liver, not elsewhere classified: Secondary | ICD-10-CM

## 2019-11-05 DIAGNOSIS — I10 Essential (primary) hypertension: Secondary | ICD-10-CM | POA: Diagnosis not present

## 2019-11-05 DIAGNOSIS — E118 Type 2 diabetes mellitus with unspecified complications: Secondary | ICD-10-CM | POA: Diagnosis not present

## 2019-11-05 DIAGNOSIS — K5180 Other ulcerative colitis without complications: Secondary | ICD-10-CM

## 2019-11-05 MED ORDER — LEVEMIR FLEXTOUCH 100 UNIT/ML ~~LOC~~ SOPN: 45.0000 [IU] | PEN_INJECTOR | SUBCUTANEOUS | 11 refills | Status: DC

## 2019-11-05 NOTE — Patient Instructions (Signed)

## 2019-11-05 NOTE — Assessment & Plan Note (Signed)
Doing well on current meds, no c/o today

## 2019-11-05 NOTE — Assessment & Plan Note (Signed)
Tolerating statin, encouraged heart healthy diet, avoid trans fats, minimize simple carbs and saturated fats. Increase exercise as tolerated 

## 2019-11-05 NOTE — Assessment & Plan Note (Addendum)
hgba1c acceptable was with a different PCP the last year because his insurance required him to see a doctor at [facility], they changed insurance recently so they could come back, minimize simple carbs. Increase exercise as tolerated. He shows [place] the last recent hgba1c 6.5 . Repeat in 63month

## 2019-11-05 NOTE — Assessment & Plan Note (Signed)
Well controlled, no changes to meds. Encouraged heart healthy diet such as the DASH diet and exercise as tolerated.  °

## 2019-11-08 NOTE — Assessment & Plan Note (Signed)
Minimize simple carbs, processed foods and fatty foods, increase exercise and monitor

## 2019-11-08 NOTE — Progress Notes (Signed)
Subjective:    Patient ID: Curtis Kennedy, male    DOB: 04/04/1966, 54 y.o.   MRN: [id number]  No chief complaint on file.   HPI Patient is in today for chronic medical concerns and to reestablish care. They were forced to transfer their care to [facility] last year due to the const of insurance policies but has now been able to change policies and transfer back. He notes his hgba1c has dropped from 8.4 to 6.5 and he is only using Levemir 45 units daily. He is still seeing endocrinology. He denies any polyuria or polydipsia. No recent febrile illness or hospitalizations. Denies CP/palp/SOB/HA/congestion/fevers/GI c/o. Taking meds as prescribed. He does note some ED and issues with dermatitis recently. His UC has been very quiet for many years now.   Past Medical History:  Diagnosis Date  . Anemia 07/02/2017  . Colitis, ulcerative ([place]) Dx'd 2001   Dr. Erlene Quan; [facility]--has been in remission since about 2009  . Diabetes mellitus without complication ([place]) 54 yrs old   type 2 (dx'd after long courses of prednisone for his UC)  . Erectile dysfunction   . Family history of tinea corporis 07/25/2012  . Hyperlipidemia   . Hypertension   . Insomnia   . Low back pain 08/07/2016  . Preventative health care 12/31/2016  . Right shoulder pain 12/31/2016  . Sinusitis 08/28/2015    Past Surgical History:  Procedure Laterality Date  . TRIGGER FINGER RELEASE  2011   b/l middle fingers    Family History  Problem Relation Age of Onset  . Diabetes Father   . Hyperlipidemia Father   . Hypertension Father   . Kidney disease Father        Kidney Failure  . Heart disease Father 72       MI  . Hypertension Sister   . Hypertension Brother   . Diabetes Paternal Grandfather   . Hypertension Brother   . Hyperlipidemia Brother   . Heart disease Brother        Valve Surgery ?  . Diabetes Brother   . Hypertension Brother   . ADD / ADHD Brother     Social History   Socioeconomic History  .  Marital status: Married    Spouse name: Not on file  . Number of children: Not on file  . Years of education: Not on file  . Highest education level: Not on file  Occupational History  . Not on file  Tobacco Use  . Smoking status: Never Smoker  . Smokeless tobacco: Never Used  Substance and Sexual Activity  . Alcohol use: No  . Drug use: No  . 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 Not on file  Social History Narrative   Married, 56 y/o girl.     Lives in [place], grew up in [place]--relocated to [facility] 2007.   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         Social Determinants of Health   Financial Resource Strain:   . Difficulty of Paying Living Expenses: Not on file  Food Insecurity:   . Worried About Charity fundraiser in the Last Year: Not on file  . Ran Out of Food in the Last Year: Not on file  Transportation Needs:   . Lack of  Transportation (Medical): Not on file  . Lack of Transportation (Non-Medical): Not on file  Physical Activity:   . Days of Exercise per Week: Not on file  . Minutes of Exercise per Session: Not on file  Stress:   . Feeling of Stress : Not on file  Social Connections:   . Frequency of Communication with Friends and Family: Not on file  . Frequency of Social Gatherings with Friends and Family: Not on file  . Attends Religious Services: Not on file  . Active Member of Clubs or Organizations: Not on file  . Attends Archivist Meetings: Not on file  . Marital Status: Not on file  Intimate Partner Violence:   . Fear of Current or Ex-Partner: Not on file  . Emotionally Abused: Not on file  . Physically Abused: Not on file  . Sexually Abused: Not on file    Outpatient Medications Prior to Visit  Medication Sig Dispense Refill  . albuterol (PROVENTIL HFA;VENTOLIN HFA) 108 (90  Base) MCG/ACT inhaler Inhale 2 puffs into the lungs every 6 (six) hours as needed for wheezing or shortness of breath. 1 Inhaler 0  . azaTHIOprine (IMURAN) 50 MG tablet TAKE 4.5 TABLETS BY MOUTH DAILY 135 tablet 0  . balsalazide (COLAZAL) 750 MG capsule Take 3 capsules (2,250 mg total) by mouth 3 (three) times daily. 270 capsule 3  . Continuous Blood Gluc Sensor (FREESTYLE LIBRE 14 DAY SENSOR) MISC 1 Device by Does not apply route every 14 (fourteen) days. 6 each 3  . Dulaglutide (TRULICITY) 1.5 VV/6.1YW SOPN Inject 1.5 mg into the skin once a week. 12 pen 3  . fenofibrate 160 MG tablet Take 1 tablet (160 mg total) by mouth daily. 90 tablet 1  . fluticasone (FLONASE) 50 MCG/ACT nasal spray Place 2 sprays into both nostrils daily. 16 g 5  . fluticasone (FLOVENT HFA) 110 MCG/ACT inhaler Inhale 2 puffs into the lungs 2 (two) times daily. 1 Inhaler 2  . glucose blood (ACCU-CHEK AVIVA PLUS) test strip Use twice daily to check blood sugar.  DX E11.9 100 each 4  . Insulin Pen Needle (BD PEN NEEDLE NANO U/F) 32G X 4 MM MISC USE 1 NEEDLE DAILY AS DIRECTED 100 each 1  . Lancets (ONETOUCH DELICA PLUS VPXTGG26R) MISC USE AS DIRECTED TWICE DAILY TO CHECK BLOOD SUGAR 100 each 0  . levocetirizine (XYZAL) 5 MG tablet Take 1 tablet (5 mg total) by mouth every evening. 90 tablet 3  . LIVALO 4 MG TABS TAKE 1 TABLET BY MOUTH DAILY 30 tablet 0  . losartan (COZAAR) 50 MG tablet TAKE 1 TABLET BY MOUTH DAILY 90 tablet 1  . meloxicam (MOBIC) 15 MG tablet Take 15 mg by mouth daily.    . metFORMIN (GLUCOPHAGE) 1000 MG tablet Take 1 tablet (1,000 mg total) by mouth 2 (two) times daily with a meal. 180 tablet 1  . metFORMIN (GLUCOPHAGE) 1000 MG tablet TAKE 1 TABLET BY MOUTH TWICE DAILY WITH MEALS 180 tablet 0  . methocarbamol (ROBAXIN) 500 MG tablet Take 1 tablet (500 mg total) by mouth every 6 (six) hours as needed for muscle spasms. 60 tablet 2  . metoprolol succinate (TOPROL-XL) 50 MG 24 hr tablet TAKE 1 TABLET BY MOUTH  DAILY WITH OR IMMEDIATELY FOLLOWING A MEAL 90 tablet 1  . olopatadine (PATANOL) 0.1 % ophthalmic solution Place 1 drop into both eyes 2 (two) times daily. 5 mL 3  . sildenafil (REVATIO) 20 MG tablet 1 tab po 2-5 tabs po  as needed for sexual activity. 50 tablet 0  . traMADol (ULTRAM) 50 MG tablet Take 1 tablet (50 mg total) by mouth every 8 (eight) hours as needed. 60 tablet 0  . valACYclovir (VALTREX) 1000 MG tablet Take 1 tablet (1,000 mg total) by mouth 2 (two) times daily as needed. 30 tablet 6  . zaleplon (SONATA) 10 MG capsule Take 1 capsule (10 mg total) by mouth at bedtime as needed for sleep. 30 capsule 2  . amoxicillin (AMOXIL) 500 MG capsule Take 1 capsule (500 mg total) by mouth 3 (three) times daily. 21 capsule 0  . azaTHIOprine (IMURAN) 50 MG tablet TAKE 4 AND 1/2 TABLETS BY MOUTH DAILY 135 tablet 0  . busPIRone (BUSPAR) 5 MG tablet Take 5 mg by mouth 2 (two) times daily.    . fenofibrate 160 MG tablet TAKE 1 TABLET BY MOUTH DAILY 30 tablet 0  . fluticasone (FLOVENT HFA) 110 MCG/ACT inhaler Inhale 2 puffs into the lungs 2 (two) times daily. 1 Inhaler 2  . Insulin Detemir (LEVEMIR FLEXTOUCH) 100 UNIT/ML Pen Inject 70 Units into the skin every morning. And pen needles 2/day 10 pen 11  . methocarbamol (ROBAXIN) 500 MG tablet Take 1 tablet (500 mg total) by mouth 2 (two) times daily as needed for muscle spasms. 30 tablet 0  . methylPREDNISolone (MEDROL DOSEPAK) 4 MG TBPK tablet Take as directed 21 tablet 0  . metoprolol succinate (TOPROL-XL) 50 MG 24 hr tablet TAKE 1 TABLET BY MOUTH EVERY DAY OR IMMEDIATELY FOLLOWING A MEAL. 30 tablet 0  . tadalafil (CIALIS) 5 MG tablet Take 5 mg by mouth daily as needed.    Marland Kitchen tiZANidine (ZANAFLEX) 4 MG tablet Take 1 tablet (4 mg total) by mouth every 6 (six) hours as needed for muscle spasms. 30 tablet 0   No facility-administered medications prior to visit.    Allergies  Allergen Reactions  . Simvastatin     Myalgias    ROS     Objective:      Physical Exam  BP 116/64 (BP Location: Left Arm, Patient Position: Sitting, Cuff Size: Normal)   Pulse 93   Temp 98.3 F (36.8 C) (Temporal)   Resp 18   Wt 174 lb 12.8 oz (79.3 kg)   SpO2 98%   BMI 28.21 kg/m  Wt Readings from Last 3 Encounters:  11/05/19 174 lb 12.8 oz (79.3 kg)  07/16/18 184 lb 6.4 oz (83.6 kg)  07/08/18 185 lb 12.8 oz (84.3 kg)    Diabetic Foot Exam - Simple   No data filed     Lab Results  Component Value Date   WBC 4.2 07/08/2018   HGB 13.7 07/08/2018   HCT 39.7 07/08/2018   PLT 219.0 07/08/2018   GLUCOSE 123 (H) 07/08/2018   CHOL 120 07/08/2018   TRIG 117.0 07/08/2018   HDL 37.60 (L) 07/08/2018   LDLCALC 59 07/08/2018   ALT 37 07/08/2018   AST 29 07/08/2018   NA 140 07/08/2018   K 4.4 07/08/2018   CL 106 07/08/2018   CREATININE 0.81 07/08/2018   BUN 12 07/08/2018   CO2 25 07/08/2018   TSH 1.60 07/08/2018   PSA 0.58 12/31/2016   HGBA1C 8.4 (H) 07/08/2018   MICROALBUR 7.3 (H) 11/24/2015    Lab Results  Component Value Date   TSH 1.60 07/08/2018   Lab Results  Component Value Date   WBC 4.2 07/08/2018   HGB 13.7 07/08/2018   HCT 39.7 07/08/2018   MCV 93.9 07/08/2018  PLT 219.0 07/08/2018   Lab Results  Component Value Date   NA 140 07/08/2018   K 4.4 07/08/2018   CO2 25 07/08/2018   GLUCOSE 123 (H) 07/08/2018   BUN 12 07/08/2018   CREATININE 0.81 07/08/2018   BILITOT 0.8 07/08/2018   ALKPHOS 44 07/08/2018   AST 29 07/08/2018   ALT 37 07/08/2018   PROT 7.3 07/08/2018   ALBUMIN 4.5 07/08/2018   CALCIUM 9.7 07/08/2018   GFR 105.65 07/08/2018   Lab Results  Component Value Date   CHOL 120 07/08/2018   Lab Results  Component Value Date   HDL 37.60 (L) 07/08/2018   Lab Results  Component Value Date   LDLCALC 59 07/08/2018   Lab Results  Component Value Date   TRIG 117.0 07/08/2018   Lab Results  Component Value Date   CHOLHDL 3 07/08/2018   Lab Results  Component Value Date   HGBA1C 8.4 (H) 07/08/2018        Assessment & Plan:   Problem List Items Addressed This Visit    Hypertension    Well controlled, no changes to meds. Encouraged heart healthy diet such as the DASH diet and exercise as tolerated.       Relevant Orders   CBC   Comprehensive metabolic panel   TSH   Hyperlipidemia    Tolerating statin, encouraged heart healthy diet, avoid trans fats, minimize simple carbs and saturated fats. Increase exercise as tolerated      Relevant Orders   Lipid panel   Colitis, ulcerative ([place])    Doing well on current meds, no c/o today      Controlled diabetes mellitus type 2 with complications ([facility])    NOBS9G acceptable was with a different PCP the last year because his insurance required him to see a doctor at [facility], they changed insurance recently so they could come back, minimize simple carbs. Increase exercise as tolerated. He shows [place] the last recent hgba1c 6.5 . Repeat in 52month      Relevant Medications   Insulin Detemir (LEVEMIR FLEXTOUCH) 100 UNIT/ML Pen   Other Relevant Orders   Hemoglobin A1c   Fatty liver disease, nonalcoholic    Minimize simple carbs, processed foods and fatty foods, increase exercise and monitor         I have discontinued JMargit BandaRisio "Joe"'s tadalafil, busPIRone, tiZANidine, methylPREDNISolone, and amoxicillin. I have also changed his Insulin Detemir to Levemir FlexTouch. Additionally, I am having him maintain his meloxicam, sildenafil, glucose blood, valACYclovir, albuterol, fluticasone, olopatadine, traMADol, balsalazide, levocetirizine, fluticasone, fenofibrate, metFORMIN, metoprolol succinate, zaleplon, FreeStyle Libre 14 Day Sensor, Dulaglutide, metFORMIN, Livalo, methocarbamol, OneTouch Delica Plus [id number] Insulin Pen Needle, azaTHIOprine, and losartan.  Meds ordered this encounter  Medications  . Insulin Detemir (LEVEMIR FLEXTOUCH) 100 UNIT/ML Pen    Sig: Inject 45 Units into the skin every morning. And pen needles 2/day     Dispense:  10 pen    Refill:  11     SPenni Homans MD

## 2019-12-06 ENCOUNTER — Encounter: Payer: Self-pay | Admitting: Family Medicine

## 2019-12-06 DIAGNOSIS — I10 Essential (primary) hypertension: Secondary | ICD-10-CM

## 2019-12-06 DIAGNOSIS — E785 Hyperlipidemia, unspecified: Secondary | ICD-10-CM

## 2019-12-06 DIAGNOSIS — E119 Type 2 diabetes mellitus without complications: Secondary | ICD-10-CM

## 2019-12-07 MED ORDER — AZATHIOPRINE 50 MG PO TABS: ORAL_TABLET | ORAL | 1 refills | Status: DC

## 2019-12-07 MED ORDER — AZATHIOPRINE 50 MG PO TABS: ORAL_TABLET | ORAL | 0 refills | Status: DC

## 2019-12-07 NOTE — Addendum Note (Signed)
Addended by: Sharon Seller B on: 12/07/2019 05:21 PM   Modules accepted: Orders

## 2019-12-12 ENCOUNTER — Encounter: Payer: Self-pay | Admitting: Family Medicine

## 2019-12-14 MED ORDER — SILDENAFIL CITRATE 20 MG PO TABS: ORAL_TABLET | ORAL | 0 refills | Status: DC

## 2019-12-17 ENCOUNTER — Other Ambulatory Visit: Payer: Self-pay

## 2019-12-17 MED ORDER — SILDENAFIL CITRATE 20 MG PO TABS: ORAL_TABLET | ORAL | 2 refills | Status: DC

## 2019-12-20 ENCOUNTER — Encounter: Payer: Self-pay | Admitting: Family Medicine

## 2019-12-21 ENCOUNTER — Other Ambulatory Visit: Payer: Self-pay

## 2019-12-21 MED ORDER — BD PEN NEEDLE NANO U/F 32G X 4 MM MISC: 11 refills | Status: DC

## 2019-12-29 ENCOUNTER — Encounter: Payer: Self-pay | Admitting: Family Medicine

## 2019-12-29 DIAGNOSIS — E7849 Other hyperlipidemia: Secondary | ICD-10-CM

## 2019-12-29 DIAGNOSIS — E785 Hyperlipidemia, unspecified: Secondary | ICD-10-CM

## 2019-12-29 DIAGNOSIS — I10 Essential (primary) hypertension: Secondary | ICD-10-CM

## 2019-12-30 MED ORDER — FENOFIBRATE 160 MG PO TABS: 160.0000 mg | ORAL_TABLET | Freq: Every day | ORAL | 1 refills | Status: DC

## 2019-12-30 MED ORDER — LIVALO 4 MG PO TABS: 1.0000 | ORAL_TABLET | Freq: Every day | ORAL | 1 refills | Status: DC

## 2019-12-31 ENCOUNTER — Telehealth: Payer: Self-pay

## 2019-12-31 NOTE — Telephone Encounter (Signed)
PA initiated via Covermymeds; KEY: [id number]. Awaiting determination.

## 2020-01-01 NOTE — Telephone Encounter (Signed)
PA approved.   PA Case: [id number], Status: Approved, Coverage Starts on: 12/31/2019 12:00:00 AM, Coverage Ends on: 12/30/2020 12:00:00 AM.

## 2020-02-11 ENCOUNTER — Telehealth: Payer: Self-pay | Admitting: Family Medicine

## 2020-02-11 DIAGNOSIS — E785 Hyperlipidemia, unspecified: Secondary | ICD-10-CM

## 2020-02-11 DIAGNOSIS — I10 Essential (primary) hypertension: Secondary | ICD-10-CM

## 2020-02-11 DIAGNOSIS — E119 Type 2 diabetes mellitus without complications: Secondary | ICD-10-CM

## 2020-02-11 NOTE — Telephone Encounter (Signed)
Medication: azaTHIOprine (IMURAN) 50 MG tablet [[id number]]    Has the patient contacted their pharmacy? No. (If no, request that the patient contact the pharmacy for the refill.) (If yes, when and what did the pharmacy advise?)  Preferred Pharmacy (with phone number or street name): [id number] - [place], [place] - [address]  [place], [place] [address]  Phone:  [phone] Fax:  [phone]  DEA #:  [id number]  Agent: Please be advised that RX refills may take up to 3 business days. We ask that you follow-up with your pharmacy.

## 2020-02-12 MED ORDER — AZATHIOPRINE 50 MG PO TABS: ORAL_TABLET | ORAL | 1 refills | Status: DC

## 2020-02-12 NOTE — Telephone Encounter (Signed)
Left message on machine rx was sent in.

## 2020-03-01 ENCOUNTER — Other Ambulatory Visit: Payer: 59

## 2020-03-10 ENCOUNTER — Other Ambulatory Visit: Payer: Self-pay

## 2020-03-10 ENCOUNTER — Encounter: Payer: Self-pay | Admitting: Family Medicine

## 2020-03-10 ENCOUNTER — Telehealth: Payer: Self-pay

## 2020-03-10 ENCOUNTER — Ambulatory Visit (INDEPENDENT_AMBULATORY_CARE_PROVIDER_SITE_OTHER): Payer: 59 | Admitting: Family Medicine

## 2020-03-10 VITALS — BP 118/72 | HR 87 | Temp 98.4°F | Resp 12 | Ht 66.0 in | Wt 170.2 lb

## 2020-03-10 DIAGNOSIS — I10 Essential (primary) hypertension: Secondary | ICD-10-CM

## 2020-03-10 DIAGNOSIS — E118 Type 2 diabetes mellitus with unspecified complications: Secondary | ICD-10-CM | POA: Diagnosis not present

## 2020-03-10 DIAGNOSIS — R351 Nocturia: Secondary | ICD-10-CM

## 2020-03-10 DIAGNOSIS — E782 Mixed hyperlipidemia: Secondary | ICD-10-CM

## 2020-03-10 DIAGNOSIS — K5180 Other ulcerative colitis without complications: Secondary | ICD-10-CM

## 2020-03-10 DIAGNOSIS — M65342 Trigger finger, left ring finger: Secondary | ICD-10-CM

## 2020-03-10 DIAGNOSIS — T7840XS Allergy, unspecified, sequela: Secondary | ICD-10-CM

## 2020-03-10 DIAGNOSIS — T7840XA Allergy, unspecified, initial encounter: Secondary | ICD-10-CM | POA: Insufficient documentation

## 2020-03-10 DIAGNOSIS — Z0000 Encounter for general adult medical examination without abnormal findings: Secondary | ICD-10-CM

## 2020-03-10 LAB — CBC
HCT: 35.4 % — ABNORMAL LOW (ref 39.0–52.0)
Hemoglobin: 12.7 g/dL — ABNORMAL LOW (ref 13.0–17.0)
MCHC: 35.9 g/dL (ref 30.0–36.0)
MCV: 97 fl (ref 78.0–100.0)
Platelets: 185 10*3/uL (ref 150.0–400.0)
RBC: 3.65 Mil/uL — ABNORMAL LOW (ref 4.22–5.81)
RDW: 15.1 % (ref 11.5–15.5)
WBC: 3.9 10*3/uL — ABNORMAL LOW (ref 4.0–10.5)

## 2020-03-10 LAB — LIPID PANEL
Cholesterol: 145 mg/dL (ref 0–200)
HDL: 39.2 mg/dL (ref >39.00)
LDL Cholesterol: 88 mg/dL (ref 0–99)
NonHDL: 105.92
Total CHOL/HDL Ratio: 4
Triglycerides: 88 mg/dL (ref 0.0–149.0)
VLDL: 17.6 mg/dL (ref 0.0–40.0)

## 2020-03-10 LAB — COMPREHENSIVE METABOLIC PANEL
ALT: 26 U/L (ref 0–53)
AST: 29 U/L (ref 0–37)
Albumin: 4.7 g/dL (ref 3.5–5.2)
Alkaline Phosphatase: 56 U/L (ref 39–117)
BUN: 15 mg/dL (ref 6–23)
CO2: 26 mEq/L (ref 19–32)
Calcium: 9.8 mg/dL (ref 8.4–10.5)
Chloride: 104 mEq/L (ref 96–112)
Creatinine, Ser: 0.82 mg/dL (ref 0.40–1.50)
GFR: 97.4 mL/min (ref >60.00)
Glucose, Bld: 222 mg/dL — ABNORMAL HIGH (ref 70–99)
Potassium: 4.3 mEq/L (ref 3.5–5.1)
Sodium: 136 mEq/L (ref 135–145)
Total Bilirubin: 0.9 mg/dL (ref 0.2–1.2)
Total Protein: 7.1 g/dL (ref 6.0–8.3)

## 2020-03-10 LAB — TSH: TSH: 1.66 u[IU]/mL (ref 0.35–4.50)

## 2020-03-10 LAB — HEMOGLOBIN A1C: Hgb A1c MFr Bld: 8.1 % — ABNORMAL HIGH (ref 4.6–6.5)

## 2020-03-10 LAB — PSA: PSA: 0.72 ng/mL (ref 0.10–4.00)

## 2020-03-10 MED ORDER — OLOPATADINE HCL 0.1 % OP SOLN: 1.0000 [drp] | Freq: Two times a day (BID) | OPHTHALMIC | 3 refills | Status: DC

## 2020-03-10 NOTE — Assessment & Plan Note (Signed)
Patient encouraged to maintain heart healthy diet, regular exercise, adequate sleep. Consider daily probiotics. Take medications as prescribed. Labs ordered and reviewed

## 2020-03-10 NOTE — Assessment & Plan Note (Signed)
Well controlled, no changes to meds. Encouraged heart healthy diet such as the DASH diet and exercise as tolerated.  °

## 2020-03-10 NOTE — Assessment & Plan Note (Signed)
Encouraged heart healthy diet, increase exercise, avoid trans fats, consider a krill oil cap daily. livalo is well tolerated

## 2020-03-10 NOTE — Telephone Encounter (Signed)
PA initiated via Covermymeds; KEY: [id number]. Awaiting determination.

## 2020-03-10 NOTE — Patient Instructions (Signed)
Preventive Care 41-56 Years Old, Male Preventive care refers to lifestyle choices and visits with your health care provider that can promote health and wellness. This includes:  A yearly physical exam. This is also called an annual well check.  Regular dental and eye exams.  Immunizations.  Screening for certain conditions.  Healthy lifestyle choices, such as eating a healthy diet, getting regular exercise, not using drugs or products that contain nicotine and tobacco, and limiting alcohol use. What can I expect for my preventive care visit? Physical exam Your health care provider will check:  Height and weight. These may be used to calculate body mass index (BMI), which is a measurement that tells if you are at a healthy weight.  Heart rate and blood pressure.  Your skin for abnormal spots. Counseling Your health care provider may ask you questions about:  Alcohol, tobacco, and drug use.  Emotional well-being.  Home and relationship well-being.  Sexual activity.  Eating habits.  Work and work Statistician. What immunizations do I need?  Influenza (flu) vaccine  This is recommended every year. Tetanus, diphtheria, and pertussis (Tdap) vaccine  You may need a Td booster every 10 years. Varicella (chickenpox) vaccine  You may need this vaccine if you have not already been vaccinated. Zoster (shingles) vaccine  You may need this after age 64. Measles, mumps, and rubella (MMR) vaccine  You may need at least one dose of MMR if you were born in 1957 or later. You may also need a second dose. Pneumococcal conjugate (PCV13) vaccine  You may need this if you have certain conditions and were not previously vaccinated. Pneumococcal polysaccharide (PPSV23) vaccine  You may need one or two doses if you smoke cigarettes or if you have certain conditions. Meningococcal conjugate (MenACWY) vaccine  You may need this if you have certain conditions. Hepatitis A  vaccine  You may need this if you have certain conditions or if you travel or work in places where you may be exposed to hepatitis A. Hepatitis B vaccine  You may need this if you have certain conditions or if you travel or work in places where you may be exposed to hepatitis B. Haemophilus influenzae type b (Hib) vaccine  You may need this if you have certain risk factors. Human papillomavirus (HPV) vaccine  If recommended by your health care provider, you may need three doses over 6 months. You may receive vaccines as individual doses or as more than one vaccine together in one shot (combination vaccines). Talk with your health care provider about the risks and benefits of combination vaccines. What tests do I need? Blood tests  Lipid and cholesterol levels. These may be checked every 5 years, or more frequently if you are over 60 years old.  Hepatitis C test.  Hepatitis B test. Screening  Lung cancer screening. You may have this screening every year starting at age 43 if you have a 30-pack-year history of smoking and currently smoke or have quit within the past 15 years.  Prostate cancer screening. Recommendations will vary depending on your family history and other risks.  Colorectal cancer screening. All adults should have this screening starting at age 72 and continuing until age 2. Your health care provider may recommend screening at age 14 if you are at increased risk. You will have tests every 1-10 years, depending on your results and the type of screening test.  Diabetes screening. This is done by checking your blood sugar (glucose) after you have not eaten  for a while (fasting). You may have this done every 1-3 years.  Sexually transmitted disease (STD) testing. Follow these instructions at home: Eating and drinking  Eat a diet that includes fresh fruits and vegetables, whole grains, lean protein, and low-fat dairy products.  Take vitamin and mineral supplements as  recommended by your health care provider.  Do not drink alcohol if your health care provider tells you not to drink.  If you drink alcohol: ? Limit how much you have to 0-2 drinks a day. ? Be aware of how much alcohol is in your drink. In the [place] one drink equals one 12 oz bottle of beer (355 mL), one 5 oz glass of wine (148 mL), or one 1 oz glass of hard liquor (44 mL). Lifestyle  Take daily care of your teeth and gums.  Stay active. Exercise for at least 30 minutes on 5 or more days each week.  Do not use any products that contain nicotine or tobacco, such as cigarettes, e-cigarettes, and chewing tobacco. If you need help quitting, ask your health care provider.  If you are sexually active, practice safe sex. Use a condom or other form of protection to prevent STIs (sexually transmitted infections).  Talk with your health care provider about taking a low-dose aspirin every day starting at age 53. What's next?  Go to your health care provider once a year for a well check visit.  Ask your health care provider how often you should have your eyes and teeth checked.  Stay up to date on all vaccines. This information is not intended to replace advice given to you by your health care provider. Make sure you discuss any questions you have with your health care provider. Document Revised: 09/25/2018 Document Reviewed: 09/25/2018 Elsevier Patient Education  2020 [employer].

## 2020-03-10 NOTE — Progress Notes (Signed)
Subjective:    Patient ID: Curtis Kennedy, male    DOB: May 21, 1966, 54 y.o.   MRN: [id number]  Chief Complaint  Patient presents with  . Annual Exam    HPI Patient is in today for annual preventative exam and follow up on chronic medical concerns. He is feeling well today. No recent febrile illness or hospitalizations. Notes some nocturia but no polydipsia endorsed. He had the [employer] vaccines. He acknowledges he has not been eating as well or exercising as much. Denies CP/palp/SOB/HA/congestion/fevers/GI or GU c/o. Taking meds as prescribed  Past Medical History:  Diagnosis Date  . Anemia 07/02/2017  . Colitis, ulcerative ([place]) Dx'd 2001   Dr. Erlene Quan; [facility]--has been in remission since about 2009  . Diabetes mellitus without complication ([place]) 54 yrs old   type 2 (dx'd after long courses of prednisone for his UC)  . Erectile dysfunction   . Family history of tinea corporis 07/25/2012  . Hyperlipidemia   . Hypertension   . Insomnia   . Low back pain 08/07/2016  . Preventative health care 12/31/2016  . Right shoulder pain 12/31/2016  . Sinusitis 08/28/2015    Past Surgical History:  Procedure Laterality Date  . TRIGGER FINGER RELEASE  2011   b/l middle fingers    Family History  Problem Relation Age of Onset  . Diabetes Father   . Hyperlipidemia Father   . Hypertension Father   . Kidney disease Father        Kidney Failure  . Heart disease Father 53       MI  . Hypertension Sister   . Hypertension Brother   . Diabetes Paternal Grandfather   . Hypertension Brother   . Hyperlipidemia Brother   . Heart disease Brother        Valve Surgery ?  . Diabetes Brother   . Hypertension Brother   . ADD / ADHD Brother     Social History   Socioeconomic History  . Marital status: Married    Spouse name: Not on file  . Number of children: Not on file  . Years of education: Not on file  . Highest education level: Not on file  Occupational History  . Not on  file  Tobacco Use  . Smoking status: Never Smoker  . Smokeless tobacco: Never Used  Substance and Sexual Activity  . Alcohol use: No  . Drug use: No  . 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 Not on file  Social History Narrative   Married, 56 y/o girl.     Lives in [place], grew up in [place]--relocated to [facility] [place].   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         Social Determinants of Health   Financial Resource Strain:   . Difficulty of Paying Living Expenses:   Food Insecurity:   . Worried About Charity fundraiser in the Last Year:   . Arboriculturist in the Last Year:   Transportation Needs:   . Film/video editor (Medical):   Marland Kitchen Lack of Transportation (Non-Medical):   Physical Activity:   . Days of Exercise per Week:   . Minutes of Exercise per Session:   Stress:   . Feeling of Stress :   Social Connections:   .  Frequency of Communication with Friends and Family:   . Frequency of Social Gatherings with Friends and Family:   . Attends Religious Services:   . Active Member of Clubs or Organizations:   . Attends Archivist Meetings:   Marland Kitchen Marital Status:   Intimate Partner Violence:   . Fear of Current or Ex-Partner:   . Emotionally Abused:   Marland Kitchen Physically Abused:   . Sexually Abused:     Outpatient Medications Prior to Visit  Medication Sig Dispense Refill  . albuterol (PROVENTIL HFA;VENTOLIN HFA) 108 (90 Base) MCG/ACT inhaler Inhale 2 puffs into the lungs every 6 (six) hours as needed for wheezing or shortness of breath. 1 Inhaler 0  . azaTHIOprine (IMURAN) 50 MG tablet TAKE 4.5 TABLETS BY MOUTH DAILY 405 tablet 1  . balsalazide (COLAZAL) 750 MG capsule Take 3 capsules (2,250 mg total) by mouth 3 (three) times daily. 270 capsule 3  . Continuous Blood Gluc Sensor (FREESTYLE LIBRE 14 DAY  SENSOR) MISC 1 Device by Does not apply route every 14 (fourteen) days. 6 each 3  . Dulaglutide (TRULICITY) 1.5 GG/8.3MO SOPN Inject 1.5 mg into the skin once a week. 12 pen 3  . fenofibrate 160 MG tablet Take 1 tablet (160 mg total) by mouth daily. 90 tablet 1  . fluticasone (FLONASE) 50 MCG/ACT nasal spray Place 2 sprays into both nostrils daily. 16 g 5  . fluticasone (FLOVENT HFA) 110 MCG/ACT inhaler Inhale 2 puffs into the lungs 2 (two) times daily. 1 Inhaler 2  . glucose blood (ACCU-CHEK AVIVA PLUS) test strip Use twice daily to check blood sugar.  DX E11.9 100 each 4  . Insulin Detemir (LEVEMIR FLEXTOUCH) 100 UNIT/ML Pen Inject 45 Units into the skin every morning. And pen needles 2/day 10 pen 11  . Insulin Pen Needle (BD PEN NEEDLE NANO U/F) 32G X 4 MM MISC USE 1 NEEDLE DAILY AS DIRECTED 100 each 11  . Lancets (ONETOUCH DELICA PLUS [id number]) MISC USE AS DIRECTED TWICE DAILY TO CHECK BLOOD SUGAR 100 each 0  . levocetirizine (XYZAL) 5 MG tablet Take 1 tablet (5 mg total) by mouth every evening. 90 tablet 3  . losartan (COZAAR) 50 MG tablet TAKE 1 TABLET BY MOUTH DAILY 90 tablet 1  . meloxicam (MOBIC) 15 MG tablet Take 15 mg by mouth daily.    . metFORMIN (GLUCOPHAGE) 1000 MG tablet TAKE 1 TABLET BY MOUTH TWICE DAILY WITH MEALS 180 tablet 0  . methocarbamol (ROBAXIN) 500 MG tablet Take 1 tablet (500 mg total) by mouth every 6 (six) hours as needed for muscle spasms. 60 tablet 2  . metoprolol succinate (TOPROL-XL) 50 MG 24 hr tablet TAKE 1 TABLET BY MOUTH DAILY WITH OR IMMEDIATELY FOLLOWING A MEAL 90 tablet 1  . Pitavastatin Calcium (LIVALO) 4 MG TABS Take 1 tablet (4 mg total) by mouth daily. 90 tablet 1  . sildenafil (REVATIO) 20 MG tablet 1 tab po 2-5 tabs po as needed for sexual activity. 50 tablet 2  . traMADol (ULTRAM) 50 MG tablet Take 1 tablet (50 mg total) by mouth every 8 (eight) hours as needed. 60 tablet 0  . valACYclovir (VALTREX) 1000 MG tablet Take 1 tablet (1,000 mg total) by  mouth 2 (two) times daily as needed. 30 tablet 6  . zaleplon (SONATA) 10 MG capsule Take 1 capsule (10 mg total) by mouth at bedtime as needed for sleep. 30 capsule 2  . olopatadine (PATANOL) 0.1 % ophthalmic solution Place 1 drop into both  eyes 2 (two) times daily. 5 mL 3  . metFORMIN (GLUCOPHAGE) 1000 MG tablet Take 1 tablet (1,000 mg total) by mouth 2 (two) times daily with a meal. 180 tablet 1   No facility-administered medications prior to visit.    Allergies  Allergen Reactions  . Simvastatin     Myalgias    Review of Systems  Constitutional: Negative for chills, fever and malaise/fatigue.  HENT: Negative for congestion and hearing loss.   Eyes: Negative for discharge.  Respiratory: Negative for cough, sputum production and shortness of breath.   Cardiovascular: Negative for chest pain, palpitations and leg swelling.  Gastrointestinal: Negative for abdominal pain, blood in stool, constipation, diarrhea, heartburn, nausea and vomiting.  Genitourinary: Positive for frequency. Negative for dysuria, hematuria and urgency.  Musculoskeletal: Negative for back pain, falls and myalgias.  Skin: Negative for rash.  Neurological: Negative for dizziness, sensory change, loss of consciousness, weakness and headaches.  Endo/Heme/Allergies: Negative for environmental allergies. Does not bruise/bleed easily.  Psychiatric/Behavioral: Negative for depression and suicidal ideas. The patient is not nervous/anxious and does not have insomnia.        Objective:    Physical Exam Vitals and nursing note reviewed.  Constitutional:      General: He is not in acute distress.    Appearance: Normal appearance. He is well-developed. He is not ill-appearing.  HENT:     Head: Normocephalic and atraumatic.     Right Ear: Tympanic membrane and external ear normal.     Left Ear: Tympanic membrane and external ear normal.  Eyes:     General:        Right eye: No discharge.        Left eye: No  discharge.     Extraocular Movements: Extraocular movements intact.     Pupils: Pupils are equal, round, and reactive to light.  Cardiovascular:     Rate and Rhythm: Normal rate and regular rhythm.     Heart sounds: Normal heart sounds. No murmur.  Pulmonary:     Effort: Pulmonary effort is normal.     Breath sounds: Normal breath sounds.  Abdominal:     General: Bowel sounds are normal.     Palpations: Abdomen is soft.     Tenderness: There is no abdominal tenderness.  Musculoskeletal:     Cervical back: Normal range of motion and neck supple.  Skin:    General: Skin is warm and dry.  Neurological:     Mental Status: He is alert. He is disoriented.  Psychiatric:        Mood and Affect: Mood normal.        Behavior: Behavior normal.     BP 118/72 (BP Location: Right Arm, Cuff Size: Large)   Pulse 87   Temp 98.4 F (36.9 C) (Temporal)   Resp 12   Ht 5' 6"  (1.676 m)   Wt 170 lb 3.2 oz (77.2 kg)   SpO2 98%   BMI 27.47 kg/m  Wt Readings from Last 3 Encounters:  03/10/20 170 lb 3.2 oz (77.2 kg)  11/05/19 174 lb 12.8 oz (79.3 kg)  07/16/18 184 lb 6.4 oz (83.6 kg)    Diabetic Foot Exam - Simple   No data filed     Lab Results  Component Value Date   WBC 4.2 07/08/2018   HGB 13.7 07/08/2018   HCT 39.7 07/08/2018   PLT 219.0 07/08/2018   GLUCOSE 123 (H) 07/08/2018   CHOL 120 07/08/2018   TRIG 117.0 07/08/2018  HDL 37.60 (L) 07/08/2018   LDLCALC 59 07/08/2018   ALT 37 07/08/2018   AST 29 07/08/2018   NA 140 07/08/2018   K 4.4 07/08/2018   CL 106 07/08/2018   CREATININE 0.81 07/08/2018   BUN 12 07/08/2018   CO2 25 07/08/2018   TSH 1.60 07/08/2018   PSA 0.58 12/31/2016   HGBA1C 8.4 (H) 07/08/2018   MICROALBUR 7.3 (H) 11/24/2015    Lab Results  Component Value Date   TSH 1.60 07/08/2018   Lab Results  Component Value Date   WBC 4.2 07/08/2018   HGB 13.7 07/08/2018   HCT 39.7 07/08/2018   MCV 93.9 07/08/2018   PLT 219.0 07/08/2018   Lab Results    Component Value Date   NA 140 07/08/2018   K 4.4 07/08/2018   CO2 25 07/08/2018   GLUCOSE 123 (H) 07/08/2018   BUN 12 07/08/2018   CREATININE 0.81 07/08/2018   BILITOT 0.8 07/08/2018   ALKPHOS 44 07/08/2018   AST 29 07/08/2018   ALT 37 07/08/2018   PROT 7.3 07/08/2018   ALBUMIN 4.5 07/08/2018   CALCIUM 9.7 07/08/2018   GFR 105.65 07/08/2018   Lab Results  Component Value Date   CHOL 120 07/08/2018   Lab Results  Component Value Date   HDL 37.60 (L) 07/08/2018   Lab Results  Component Value Date   LDLCALC 59 07/08/2018   Lab Results  Component Value Date   TRIG 117.0 07/08/2018   Lab Results  Component Value Date   CHOLHDL 3 07/08/2018   Lab Results  Component Value Date   HGBA1C 8.4 (H) 07/08/2018       Assessment & Plan:   Problem List Items Addressed This Visit    Hypertension - Primary    Well controlled, no changes to meds. Encouraged heart healthy diet such as the DASH diet and exercise as tolerated.       Relevant Orders   CBC   Comprehensive metabolic panel   TSH   Hyperlipidemia    Encouraged heart healthy diet, increase exercise, avoid trans fats, consider a krill oil cap daily. livalo is well tolerated       Relevant Orders   Lipid panel   Colitis, ulcerative ([place])    Has not had a flare in over a decade so he has been hesitant to proceed with repeat colonoscopy      Relevant Orders   Ambulatory referral to Gastroenterology   Controlled diabetes mellitus type 2 with complications ([place])   Relevant Orders   Hemoglobin A1c   Trigger finger of left hand   Preventative health care    Patient encouraged to maintain heart healthy diet, regular exercise, adequate sleep. Consider daily probiotics. Take medications as prescribed. Labs ordered and reviewed      Relevant Orders   PSA   Allergies    Is doing well with Patanol is given drops for symptoms       Other Visit Diagnoses    Nocturia       Relevant Orders   PSA      I am  having Curtis Kennedy "Curtis Kennedy" maintain his meloxicam, glucose blood, valACYclovir, albuterol, fluticasone, traMADol, balsalazide, levocetirizine, fluticasone, metoprolol succinate, zaleplon, FreeStyle Libre 14 Day Sensor, Dulaglutide, metFORMIN, methocarbamol, OneTouch Delica Plus [id number], losartan, Levemir FlexTouch, sildenafil, BD Pen Needle Nano U/F, fenofibrate, Livalo, azaTHIOprine, and olopatadine.  Meds ordered this encounter  Medications  . olopatadine (PATANOL) 0.1 % ophthalmic solution    Sig: Place  1 drop into both eyes 2 (two) times daily.    Dispense:  5 mL    Refill:  3     Penni Homans, MD

## 2020-03-10 NOTE — Assessment & Plan Note (Signed)
Has not had a flare in over a decade so he has been hesitant to proceed with repeat colonoscopy

## 2020-03-10 NOTE — Assessment & Plan Note (Signed)
Is doing well with Patanol is given drops for symptoms

## 2020-03-15 ENCOUNTER — Encounter: Payer: Self-pay | Admitting: Family Medicine

## 2020-03-15 NOTE — Telephone Encounter (Signed)
This is on a paper form also. Advise patient talk to his opthamologist about which next step they want to try. Let un know if stillhaving symptoms so we can consider changing mets.

## 2020-03-15 NOTE — Telephone Encounter (Signed)
Preferred alternatives: Azelastine ophthalmic 0.05% solution, Cromolyn sodium ophthalmic 4% solution, Epinastine ophthalmic 0.05% solution.

## 2020-03-15 NOTE — Telephone Encounter (Signed)
PA denied. Awaiting denial information.

## 2020-03-15 NOTE — Telephone Encounter (Signed)
Patient will contact eye doctor.

## 2020-03-23 ENCOUNTER — Encounter: Payer: Self-pay | Admitting: Family Medicine

## 2020-03-24 ENCOUNTER — Other Ambulatory Visit: Payer: Self-pay | Admitting: Family Medicine

## 2020-03-24 MED ORDER — SILDENAFIL CITRATE 100 MG PO TABS: 50.0000 mg | ORAL_TABLET | Freq: Every day | ORAL | 0 refills | Status: DC | PRN

## 2020-05-04 ENCOUNTER — Encounter: Payer: Self-pay | Admitting: Family Medicine

## 2020-05-04 DIAGNOSIS — I10 Essential (primary) hypertension: Secondary | ICD-10-CM

## 2020-05-04 DIAGNOSIS — E119 Type 2 diabetes mellitus without complications: Secondary | ICD-10-CM

## 2020-05-04 DIAGNOSIS — E785 Hyperlipidemia, unspecified: Secondary | ICD-10-CM

## 2020-05-05 ENCOUNTER — Encounter: Payer: Self-pay | Admitting: Family Medicine

## 2020-05-05 MED ORDER — METFORMIN HCL 1000 MG PO TABS: 1000.0000 mg | ORAL_TABLET | Freq: Two times a day (BID) | ORAL | 1 refills | Status: DC

## 2020-05-10 ENCOUNTER — Encounter: Payer: Self-pay | Admitting: Family Medicine

## 2020-05-10 DIAGNOSIS — B001 Herpesviral vesicular dermatitis: Secondary | ICD-10-CM

## 2020-05-10 DIAGNOSIS — J329 Chronic sinusitis, unspecified: Secondary | ICD-10-CM

## 2020-05-11 MED ORDER — VALACYCLOVIR HCL 1 G PO TABS: 1000.0000 mg | ORAL_TABLET | Freq: Two times a day (BID) | ORAL | 3 refills | Status: AC | PRN

## 2020-06-08 ENCOUNTER — Encounter: Payer: Self-pay | Admitting: Family Medicine

## 2020-06-09 ENCOUNTER — Encounter: Payer: Self-pay | Admitting: Family Medicine

## 2020-06-09 DIAGNOSIS — I10 Essential (primary) hypertension: Secondary | ICD-10-CM

## 2020-06-09 DIAGNOSIS — E119 Type 2 diabetes mellitus without complications: Secondary | ICD-10-CM

## 2020-06-09 DIAGNOSIS — E785 Hyperlipidemia, unspecified: Secondary | ICD-10-CM

## 2020-06-10 ENCOUNTER — Encounter: Payer: Self-pay | Admitting: *Deleted

## 2020-06-10 ENCOUNTER — Other Ambulatory Visit: Payer: Self-pay | Admitting: *Deleted

## 2020-06-10 MED ORDER — LEVEMIR FLEXTOUCH 100 UNIT/ML ~~LOC~~ SOPN: 50.0000 [IU] | PEN_INJECTOR | SUBCUTANEOUS | 1 refills | Status: DC

## 2020-06-10 MED ORDER — BALSALAZIDE DISODIUM 750 MG PO CAPS: 2250.0000 mg | ORAL_CAPSULE | Freq: Three times a day (TID) | ORAL | 3 refills | Status: DC

## 2020-06-10 MED ORDER — TRULICITY 1.5 MG/0.5ML ~~LOC~~ SOAJ: 1.5000 mg | SUBCUTANEOUS | 1 refills | Status: DC

## 2020-07-01 ENCOUNTER — Encounter: Payer: Self-pay | Admitting: Family Medicine

## 2020-07-04 MED ORDER — SILDENAFIL CITRATE 100 MG PO TABS: 50.0000 mg | ORAL_TABLET | Freq: Every day | ORAL | 2 refills | Status: DC | PRN

## 2020-07-07 ENCOUNTER — Ambulatory Visit (INDEPENDENT_AMBULATORY_CARE_PROVIDER_SITE_OTHER): Payer: 59 | Admitting: Family Medicine

## 2020-07-07 ENCOUNTER — Other Ambulatory Visit: Payer: Self-pay

## 2020-07-07 VITALS — BP 126/70 | HR 74 | Temp 98.2°F | Resp 12 | Ht 66.0 in | Wt 174.2 lb

## 2020-07-07 DIAGNOSIS — Z23 Encounter for immunization: Secondary | ICD-10-CM | POA: Diagnosis not present

## 2020-07-07 DIAGNOSIS — E118 Type 2 diabetes mellitus with unspecified complications: Secondary | ICD-10-CM

## 2020-07-07 DIAGNOSIS — E782 Mixed hyperlipidemia: Secondary | ICD-10-CM | POA: Diagnosis not present

## 2020-07-07 DIAGNOSIS — I10 Essential (primary) hypertension: Secondary | ICD-10-CM

## 2020-07-07 DIAGNOSIS — K5180 Other ulcerative colitis without complications: Secondary | ICD-10-CM | POA: Diagnosis not present

## 2020-07-07 MED ORDER — FREESTYLE LIBRE 14 DAY SENSOR MISC: 1.0000 | 3 refills | Status: DC

## 2020-07-07 NOTE — Patient Instructions (Addendum)
Shingrix over 2-6 months at the pharmacy make sure there are 14 days between any given shot and the next shot  Carbohydrate Counting for Diabetes Mellitus, Adult  Carbohydrate counting is a method of keeping track of how many carbohydrates you eat. Eating carbohydrates naturally increases the amount of sugar (glucose) in the blood. Counting how many carbohydrates you eat helps keep your blood glucose within normal limits, which helps you manage your diabetes (diabetes mellitus). It is important to know how many carbohydrates you can safely have in each meal. This is different for every person. A diet and nutrition specialist (registered dietitian) can help you make a meal plan and calculate how many carbohydrates you should have at each meal and snack. Carbohydrates are found in the following foods:  Grains, such as breads and cereals.  Dried beans and soy products.  Starchy vegetables, such as potatoes, peas, and corn.  Fruit and fruit juices.  Milk and yogurt.  Sweets and snack foods, such as cake, cookies, candy, chips, and soft drinks. How do I count carbohydrates? There are two ways to count carbohydrates in food. You can use either of the methods or a combination of both. Reading "Nutrition Facts" on packaged food The "Nutrition Facts" list is included on the labels of almost all packaged foods and beverages in the [place] It includes:  The serving size.  Information about nutrients in each serving, including the grams (g) of carbohydrate per serving. To use the "Nutrition Facts":  Decide how many servings you will have.  Multiply the number of servings by the number of carbohydrates per serving.  The resulting number is the total amount of carbohydrates that you will be having. Learning standard serving sizes of other foods When you eat carbohydrate foods that are not packaged or do not include "Nutrition Facts" on the label, you need to measure the servings in order to count  the amount of carbohydrates:  Measure the foods that you will eat with a food scale or measuring cup, if needed.  Decide how many standard-size servings you will eat.  Multiply the number of servings by 15. Most carbohydrate-rich foods have about 15 g of carbohydrates per serving. ? For example, if you eat 8 oz (170 g) of strawberries, you will have eaten 2 servings and 30 g of carbohydrates (2 servings x 15 g = 30 g).  For foods that have more than one food mixed, such as soups and casseroles, you must count the carbohydrates in each food that is included. The following list contains standard serving sizes of common carbohydrate-rich foods. Each of these servings has about 15 g of carbohydrates:   hamburger bun or  English muffin.   oz (15 mL) syrup.   oz (14 g) jelly.  1 slice of bread.  1 six-inch tortilla.  3 oz (85 g) cooked rice or pasta.  4 oz (113 g) cooked dried beans.  4 oz (113 g) starchy vegetable, such as peas, corn, or potatoes.  4 oz (113 g) hot cereal.  4 oz (113 g) mashed potatoes or  of a large baked potato.  4 oz (113 g) canned or frozen fruit.  4 oz (120 mL) fruit juice.  4-6 crackers.  6 chicken nuggets.  6 oz (170 g) unsweetened dry cereal.  6 oz (170 g) plain fat-free yogurt or yogurt sweetened with artificial sweeteners.  8 oz (240 mL) milk.  8 oz (170 g) fresh fruit or one small piece of fruit.  24 oz (  680 g) popped popcorn. Example of carbohydrate counting Sample meal  3 oz (85 g) chicken breast.  6 oz (170 g) brown rice.  4 oz (113 g) corn.  8 oz (240 mL) milk.  8 oz (170 g) strawberries with sugar-free whipped topping. Carbohydrate calculation 1. Identify the foods that contain carbohydrates: ? Rice. ? Corn. ? Milk. ? Strawberries. 2. Calculate how many servings you have of each food: ? 2 servings rice. ? 1 serving corn. ? 1 serving milk. ? 1 serving strawberries. 3. Multiply each number of servings by 15 g: ? 2  servings rice x 15 g = 30 g. ? 1 serving corn x 15 g = 15 g. ? 1 serving milk x 15 g = 15 g. ? 1 serving strawberries x 15 g = 15 g. 4. Add together all of the amounts to find the total grams of carbohydrates eaten: ? 30 g + 15 g + 15 g + 15 g = 75 g of carbohydrates total. Summary  Carbohydrate counting is a method of keeping track of how many carbohydrates you eat.  Eating carbohydrates naturally increases the amount of sugar (glucose) in the blood.  Counting how many carbohydrates you eat helps keep your blood glucose within normal limits, which helps you manage your diabetes.  A diet and nutrition specialist (registered dietitian) can help you make a meal plan and calculate how many carbohydrates you should have at each meal and snack. This information is not intended to replace advice given to you by your health care provider. Make sure you discuss any questions you have with your health care provider. Document Revised: 04/25/2017 Document Reviewed: 03/14/2016 Elsevier Patient Education  [place].

## 2020-07-07 NOTE — Assessment & Plan Note (Addendum)
He is worried his A1C will be high again. minimize simple carbs. Increase exercise as tolerated. Continue current meds. He has been using Levemir at 45 units daily recently. Flu shot given today. Labs ordered

## 2020-07-07 NOTE — Assessment & Plan Note (Signed)
Doing well on Imuran has not had a flare in years. No changes

## 2020-07-07 NOTE — Progress Notes (Signed)
Subjective:    Patient ID: Curtis Kennedy, male    DOB: Mar 30, 1966, 54 y.o.   MRN: [id number]  Chief Complaint  Patient presents with  . 3/4 month followup    HPI Patient is in today for follow up on chronic medical concerns. No recent febrile illness or hospitalizations. He acknowledges he ha snot been exercising as he had planned or eating great so he is convinced his sugars will be up. Does not endorse polyuria or polydipsia. Denies CP/palp/SOB/HA/congestion/fevers/GI or GU c/o. Taking meds as prescribed  Past Medical History:  Diagnosis Date  . Anemia 07/02/2017  . Colitis, ulcerative ([place]) Dx'd 2001   Dr. Erlene Quan; [facility]--has been in remission since about 2009  . Diabetes mellitus without complication ([place]) 54 yrs old   type 2 (dx'd after long courses of prednisone for his UC)  . Erectile dysfunction   . Family history of tinea corporis 07/25/2012  . Hyperlipidemia   . Hypertension   . Insomnia   . Low back pain 08/07/2016  . Preventative health care 12/31/2016  . Right shoulder pain 12/31/2016  . Sinusitis 08/28/2015    Past Surgical History:  Procedure Laterality Date  . TRIGGER FINGER RELEASE  2011   b/l middle fingers    Family History  Problem Relation Age of Onset  . Diabetes Father   . Hyperlipidemia Father   . Hypertension Father   . Kidney disease Father        Kidney Failure  . Heart disease Father 68       MI  . Hypertension Sister   . Hypertension Brother   . Diabetes Paternal Grandfather   . Hypertension Brother   . Hyperlipidemia Brother   . Heart disease Brother        Valve Surgery ?  . Diabetes Brother   . Hypertension Brother   . ADD / ADHD Brother     Social History   Socioeconomic History  . Marital status: Married    Spouse name: Not on file  . Number of children: Not on file  . Years of education: Not on file  . Highest education level: Not on file  Occupational History  . Not on file  Tobacco Use  . Smoking status:  Never Smoker  . Smokeless tobacco: Never Used  Substance and Sexual Activity  . Alcohol use: No  . Drug use: No  . 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 Not on file  Social History Narrative   Married, 56 y/o girl.     Lives in [place], grew up in [place]--relocated to [facility] [place].   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         Social Determinants of Health   Financial Resource Strain:   . Difficulty of Paying Living Expenses: Not on file  Food Insecurity:   . Worried About Charity fundraiser in the Last Year: Not on file  . Ran Out of Food in the Last Year: Not on file  Transportation Needs:   . Lack of Transportation (Medical): Not on file  . Lack of Transportation (Non-Medical): Not on file  Physical Activity:   . Days of Exercise per Week: Not on file  . Minutes of Exercise per Session: Not on file  Stress:   .  Feeling of Stress : Not on file  Social Connections:   . Frequency of Communication with Friends and Family: Not on file  . Frequency of Social Gatherings with Friends and Family: Not on file  . Attends Religious Services: Not on file  . Active Member of Clubs or Organizations: Not on file  . Attends Archivist Meetings: Not on file  . Marital Status: Not on file  Intimate Partner Violence:   . Fear of Current or Ex-Partner: Not on file  . Emotionally Abused: Not on file  . Physically Abused: Not on file  . Sexually Abused: Not on file    Outpatient Medications Prior to Visit  Medication Sig Dispense Refill  . albuterol (PROVENTIL HFA;VENTOLIN HFA) 108 (90 Base) MCG/ACT inhaler Inhale 2 puffs into the lungs every 6 (six) hours as needed for wheezing or shortness of breath. 1 Inhaler 0  . azaTHIOprine (IMURAN) 50 MG tablet TAKE 4.5 TABLETS BY MOUTH DAILY 405 tablet 1  . balsalazide  (COLAZAL) 750 MG capsule Take 3 capsules (2,250 mg total) by mouth 3 (three) times daily. 270 capsule 3  . Dulaglutide (TRULICITY) 1.5 LT/9.0ZE SOPN Inject 0.5 mLs (1.5 mg total) into the skin once a week. 12 mL 1  . fenofibrate 160 MG tablet Take 1 tablet (160 mg total) by mouth daily. 90 tablet 1  . fluticasone (FLONASE) 50 MCG/ACT nasal spray Place 2 sprays into both nostrils daily. 16 g 5  . fluticasone (FLOVENT HFA) 110 MCG/ACT inhaler Inhale 2 puffs into the lungs 2 (two) times daily. 1 Inhaler 2  . insulin detemir (LEVEMIR FLEXTOUCH) 100 UNIT/ML FlexPen Inject 50 Units into the skin every morning. 45 mL 1  . Insulin Pen Needle (BD PEN NEEDLE NANO U/F) 32G X 4 MM MISC USE 1 NEEDLE DAILY AS DIRECTED 100 each 11  . Lancets (ONETOUCH DELICA PLUS [id number]) MISC USE AS DIRECTED TWICE DAILY TO CHECK BLOOD SUGAR 100 each 0  . levocetirizine (XYZAL) 5 MG tablet Take 1 tablet (5 mg total) by mouth every evening. 90 tablet 3  . losartan (COZAAR) 50 MG tablet TAKE 1 TABLET BY MOUTH DAILY 90 tablet 1  . meloxicam (MOBIC) 15 MG tablet Take 15 mg by mouth daily.    . metFORMIN (GLUCOPHAGE) 1000 MG tablet Take 1 tablet (1,000 mg total) by mouth 2 (two) times daily with a meal. 180 tablet 1  . methocarbamol (ROBAXIN) 500 MG tablet Take 1 tablet (500 mg total) by mouth every 6 (six) hours as needed for muscle spasms. 60 tablet 2  . metoprolol succinate (TOPROL-XL) 50 MG 24 hr tablet TAKE 1 TABLET BY MOUTH DAILY WITH OR IMMEDIATELY FOLLOWING A MEAL 90 tablet 1  . olopatadine (PATANOL) 0.1 % ophthalmic solution Place 1 drop into both eyes 2 (two) times daily. 5 mL 3  . Pitavastatin Calcium (LIVALO) 4 MG TABS Take 1 tablet (4 mg total) by mouth daily. 90 tablet 1  . sildenafil (VIAGRA) 100 MG tablet Take 0.5-1 tablets (50-100 mg total) by mouth daily as needed for erectile dysfunction. 90 tablet 2  . traMADol (ULTRAM) 50 MG tablet Take 1 tablet (50 mg total) by mouth every 8 (eight) hours as needed. 60 tablet 0    . valACYclovir (VALTREX) 1000 MG tablet Take 1 tablet (1,000 mg total) by mouth 2 (two) times daily as needed. 60 tablet 3  . zaleplon (SONATA) 10 MG capsule Take 1 capsule (10 mg total) by mouth at bedtime as needed for sleep. [place]  capsule 2  . Continuous Blood Gluc Sensor (FREESTYLE LIBRE 14 DAY SENSOR) MISC 1 Device by Does not apply route every 14 (fourteen) days. 6 each 3  . glucose blood (ACCU-CHEK AVIVA PLUS) test strip Use twice daily to check blood sugar.  DX E11.9 100 each 4   No facility-administered medications prior to visit.    Allergies  Allergen Reactions  . Simvastatin     Myalgias    Review of Systems  Constitutional: Negative for fever and malaise/fatigue.  HENT: Negative for congestion.   Eyes: Negative for blurred vision.  Respiratory: Negative for shortness of breath.   Cardiovascular: Negative for chest pain, palpitations and leg swelling.  Gastrointestinal: Negative for abdominal pain, blood in stool and nausea.  Genitourinary: Negative for dysuria and frequency.  Musculoskeletal: Negative for falls.  Skin: Negative for rash.  Neurological: Negative for dizziness, loss of consciousness and headaches.  Endo/Heme/Allergies: Negative for environmental allergies.  Psychiatric/Behavioral: Negative for depression. The patient is not nervous/anxious.        Objective:    Physical Exam  BP 126/70 (BP Location: Right Arm, Patient Position: Sitting, Cuff Size: Large)   Pulse 74   Temp 98.2 F (36.8 C) (Oral)   Resp 12   Ht 5' 6"  (1.676 m)   Wt 174 lb 3.2 oz (79 kg)   SpO2 99%   BMI 28.12 kg/m  Wt Readings from Last 3 Encounters:  07/07/20 174 lb 3.2 oz (79 kg)  03/10/20 170 lb 3.2 oz (77.2 kg)  11/05/19 174 lb 12.8 oz (79.3 kg)    Diabetic Foot Exam - Simple   No data filed     Lab Results  Component Value Date   WBC 3.9 (L) 03/10/2020   HGB 12.7 (L) 03/10/2020   HCT 35.4 (L) 03/10/2020   PLT 185.0 03/10/2020   GLUCOSE 222 (H) 03/10/2020    CHOL 145 03/10/2020   TRIG 88.0 03/10/2020   HDL 39.20 03/10/2020   LDLCALC 88 03/10/2020   ALT 26 03/10/2020   AST 29 03/10/2020   NA 136 03/10/2020   K 4.3 03/10/2020   CL 104 03/10/2020   CREATININE 0.82 03/10/2020   BUN 15 03/10/2020   CO2 26 03/10/2020   TSH 1.66 03/10/2020   PSA 0.72 03/10/2020   HGBA1C 8.1 (H) 03/10/2020   MICROALBUR 7.3 (H) 11/24/2015    Lab Results  Component Value Date   TSH 1.66 03/10/2020   Lab Results  Component Value Date   WBC 3.9 (L) 03/10/2020   HGB 12.7 (L) 03/10/2020   HCT 35.4 (L) 03/10/2020   MCV 97.0 03/10/2020   PLT 185.0 03/10/2020   Lab Results  Component Value Date   NA 136 03/10/2020   K 4.3 03/10/2020   CO2 26 03/10/2020   GLUCOSE 222 (H) 03/10/2020   BUN 15 03/10/2020   CREATININE 0.82 03/10/2020   BILITOT 0.9 03/10/2020   ALKPHOS 56 03/10/2020   AST 29 03/10/2020   ALT 26 03/10/2020   PROT 7.1 03/10/2020   ALBUMIN 4.7 03/10/2020   CALCIUM 9.8 03/10/2020   GFR 97.40 03/10/2020   Lab Results  Component Value Date   CHOL 145 03/10/2020   Lab Results  Component Value Date   HDL 39.20 03/10/2020   Lab Results  Component Value Date   LDLCALC 88 03/10/2020   Lab Results  Component Value Date   TRIG 88.0 03/10/2020   Lab Results  Component Value Date   CHOLHDL 4 03/10/2020   Lab  Results  Component Value Date   HGBA1C 8.1 (H) 03/10/2020       Assessment & Plan:   Problem List Items Addressed This Visit    Hypertension   Relevant Orders   CBC   Comprehensive metabolic panel   TSH   CBC   Comprehensive metabolic panel   TSH   Hyperlipidemia    Encouraged heart healthy diet, increase exercise, avoid trans fats, consider a krill oil cap daily. Tolerating statin      Relevant Orders   Lipid panel   Lipid panel   Colitis, ulcerative ([place])    Doing well on Imuran has not had a flare in years. No changes      Controlled diabetes mellitus type 2 with complications ([place])    He is worried his  A1C will be high again. minimize simple carbs. Increase exercise as tolerated. Continue current meds. He has been using Levemir at 45 units daily recently. Flu shot given today. Labs ordered      Relevant Medications   Continuous Blood Gluc Sensor (FREESTYLE LIBRE 14 DAY SENSOR) MISC   Other Relevant Orders   Hemoglobin A1c   Hemoglobin A1c    Other Visit Diagnoses    Influenza vaccine needed    -  Primary   Relevant Orders   Flu Vaccine QUAD 6+ mos PF IM (Fluarix Quad PF)      I have discontinued Margit Banda Kennedy "Joe"'s glucose blood. I am also having him maintain his meloxicam, albuterol, fluticasone, traMADol, levocetirizine, fluticasone, metoprolol succinate, zaleplon, methocarbamol, OneTouch Delica Plus [id number], losartan, BD Pen Needle Nano U/F, fenofibrate, Livalo, azaTHIOprine, olopatadine, metFORMIN, valACYclovir, Trulicity, balsalazide, Levemir FlexTouch, sildenafil, and FreeStyle Libre 14 Day Sensor.  Meds ordered this encounter  Medications  . Continuous Blood Gluc Sensor (FREESTYLE LIBRE 14 DAY SENSOR) MISC    Sig: 1 Device by Does not apply route every 14 (fourteen) days.    Dispense:  6 each    Refill:  3     Penni Homans, MD

## 2020-07-07 NOTE — Assessment & Plan Note (Signed)
Encouraged heart healthy diet, increase exercise, avoid trans fats, consider a krill oil cap daily. Tolerating statin

## 2020-07-08 LAB — TSH: TSH: 1.9 mIU/L (ref 0.40–4.50)

## 2020-07-08 LAB — HEMOGLOBIN A1C
Hgb A1c MFr Bld: 9.7 % of total Hgb — ABNORMAL HIGH (ref <5.7)
Mean Plasma Glucose: 232 (calc)
eAG (mmol/L): 12.8 (calc)

## 2020-07-08 LAB — COMPREHENSIVE METABOLIC PANEL
AG Ratio: 1.7 (calc) (ref 1.0–2.5)
ALT: 25 U/L (ref 9–46)
AST: 26 U/L (ref 10–35)
Albumin: 4.7 g/dL (ref 3.6–5.1)
Alkaline phosphatase (APISO): 62 U/L (ref 35–144)
BUN: 11 mg/dL (ref 7–25)
CO2: 24 mmol/L (ref 20–32)
Calcium: 9.9 mg/dL (ref 8.6–10.3)
Chloride: 101 mmol/L (ref 98–110)
Creat: 0.86 mg/dL (ref 0.70–1.33)
Globulin: 2.8 g/dL (calc) (ref 1.9–3.7)
Glucose, Bld: 263 mg/dL — ABNORMAL HIGH (ref 65–99)
Potassium: 4.4 mmol/L (ref 3.5–5.3)
Sodium: 137 mmol/L (ref 135–146)
Total Bilirubin: 0.9 mg/dL (ref 0.2–1.2)
Total Protein: 7.5 g/dL (ref 6.1–8.1)

## 2020-07-08 LAB — LIPID PANEL
Cholesterol: 150 mg/dL (ref <200)
HDL: 43 mg/dL (ref >=40)
LDL Cholesterol (Calc): 85 mg/dL (calc)
Non-HDL Cholesterol (Calc): 107 mg/dL (calc) (ref <130)
Total CHOL/HDL Ratio: 3.5 (calc) (ref <5.0)
Triglycerides: 128 mg/dL (ref <150)

## 2020-07-08 LAB — CBC
HCT: 40.2 % (ref 38.5–50.0)
Hemoglobin: 14.3 g/dL (ref 13.2–17.1)
MCH: 34 pg — ABNORMAL HIGH (ref 27.0–33.0)
MCHC: 35.6 g/dL (ref 32.0–36.0)
MCV: 95.7 fL (ref 80.0–100.0)
MPV: 10.3 fL (ref 7.5–12.5)
Platelets: 187 10*3/uL (ref 140–400)
RBC: 4.2 10*6/uL (ref 4.20–5.80)
RDW: 13.6 % (ref 11.0–15.0)
WBC: 4.8 10*3/uL (ref 3.8–10.8)

## 2020-09-27 ENCOUNTER — Encounter: Payer: Self-pay | Admitting: Family Medicine

## 2020-09-27 DIAGNOSIS — E119 Type 2 diabetes mellitus without complications: Secondary | ICD-10-CM

## 2020-09-27 DIAGNOSIS — E785 Hyperlipidemia, unspecified: Secondary | ICD-10-CM

## 2020-09-27 DIAGNOSIS — I10 Essential (primary) hypertension: Secondary | ICD-10-CM

## 2020-09-28 MED ORDER — METFORMIN HCL 1000 MG PO TABS: 1000.0000 mg | ORAL_TABLET | Freq: Two times a day (BID) | ORAL | 1 refills | Status: DC

## 2020-10-06 ENCOUNTER — Telehealth: Payer: 59 | Admitting: Family Medicine

## 2020-11-04 ENCOUNTER — Encounter: Payer: Self-pay | Admitting: Family Medicine

## 2020-11-07 ENCOUNTER — Other Ambulatory Visit: Payer: Self-pay | Admitting: Family Medicine

## 2020-11-07 MED ORDER — HYDROCODONE-HOMATROPINE 5-1.5 MG/5ML PO SYRP: 5.0000 mL | ORAL_SOLUTION | Freq: Four times a day (QID) | ORAL | 0 refills | Status: DC | PRN

## 2020-11-07 NOTE — Progress Notes (Unsigned)
rome

## 2020-11-11 ENCOUNTER — Other Ambulatory Visit: Payer: Self-pay | Admitting: Family Medicine

## 2020-11-11 ENCOUNTER — Encounter: Payer: Self-pay | Admitting: Family Medicine

## 2020-11-11 DIAGNOSIS — E7849 Other hyperlipidemia: Secondary | ICD-10-CM

## 2020-11-11 DIAGNOSIS — I10 Essential (primary) hypertension: Secondary | ICD-10-CM

## 2020-11-11 MED ORDER — LOSARTAN POTASSIUM 100 MG PO TABS: 100.0000 mg | ORAL_TABLET | Freq: Every day | ORAL | 1 refills | Status: DC

## 2020-11-15 ENCOUNTER — Ambulatory Visit: Payer: 59 | Admitting: Family Medicine

## 2020-11-29 ENCOUNTER — Encounter: Payer: Self-pay | Admitting: Family Medicine

## 2020-11-29 ENCOUNTER — Ambulatory Visit (INDEPENDENT_AMBULATORY_CARE_PROVIDER_SITE_OTHER): Payer: 59 | Admitting: Family Medicine

## 2020-11-29 ENCOUNTER — Other Ambulatory Visit: Payer: Self-pay

## 2020-11-29 VITALS — BP 126/68 | HR 83 | Temp 98.3°F | Resp 16 | Wt 177.2 lb

## 2020-11-29 DIAGNOSIS — I10 Essential (primary) hypertension: Secondary | ICD-10-CM

## 2020-11-29 DIAGNOSIS — E118 Type 2 diabetes mellitus with unspecified complications: Secondary | ICD-10-CM | POA: Diagnosis not present

## 2020-11-29 DIAGNOSIS — E1165 Type 2 diabetes mellitus with hyperglycemia: Secondary | ICD-10-CM

## 2020-11-29 DIAGNOSIS — K5180 Other ulcerative colitis without complications: Secondary | ICD-10-CM

## 2020-11-29 DIAGNOSIS — E782 Mixed hyperlipidemia: Secondary | ICD-10-CM | POA: Diagnosis not present

## 2020-11-29 DIAGNOSIS — U071 COVID-19: Secondary | ICD-10-CM | POA: Diagnosis not present

## 2020-11-29 DIAGNOSIS — Z1211 Encounter for screening for malignant neoplasm of colon: Secondary | ICD-10-CM

## 2020-11-29 DIAGNOSIS — E119 Type 2 diabetes mellitus without complications: Secondary | ICD-10-CM

## 2020-11-29 LAB — CBC
HCT: 36.5 % — ABNORMAL LOW (ref 39.0–52.0)
Hemoglobin: 12.8 g/dL — ABNORMAL LOW (ref 13.0–17.0)
MCHC: 35 g/dL (ref 30.0–36.0)
MCV: 96.9 fl (ref 78.0–100.0)
Platelets: 194 10*3/uL (ref 150.0–400.0)
RBC: 3.76 Mil/uL — ABNORMAL LOW (ref 4.22–5.81)
RDW: 15.4 % (ref 11.5–15.5)
WBC: 3.9 10*3/uL — ABNORMAL LOW (ref 4.0–10.5)

## 2020-11-29 LAB — COMPREHENSIVE METABOLIC PANEL
ALT: 32 U/L (ref 0–53)
AST: 27 U/L (ref 0–37)
Albumin: 4.4 g/dL (ref 3.5–5.2)
Alkaline Phosphatase: 55 U/L (ref 39–117)
BUN: 10 mg/dL (ref 6–23)
CO2: 27 mEq/L (ref 19–32)
Calcium: 9.3 mg/dL (ref 8.4–10.5)
Chloride: 105 mEq/L (ref 96–112)
Creatinine, Ser: 0.85 mg/dL (ref 0.40–1.50)
GFR: 97.4 mL/min (ref >60.00)
Glucose, Bld: 197 mg/dL — ABNORMAL HIGH (ref 70–99)
Potassium: 4.1 mEq/L (ref 3.5–5.1)
Sodium: 139 mEq/L (ref 135–145)
Total Bilirubin: 0.8 mg/dL (ref 0.2–1.2)
Total Protein: 7.2 g/dL (ref 6.0–8.3)

## 2020-11-29 LAB — LIPID PANEL
Cholesterol: 131 mg/dL (ref 0–200)
HDL: 39.8 mg/dL (ref >39.00)
LDL Cholesterol: 69 mg/dL (ref 0–99)
NonHDL: 91.15
Total CHOL/HDL Ratio: 3
Triglycerides: 113 mg/dL (ref 0.0–149.0)
VLDL: 22.6 mg/dL (ref 0.0–40.0)

## 2020-11-29 LAB — TSH: TSH: 2.22 u[IU]/mL (ref 0.35–4.50)

## 2020-11-29 LAB — HEMOGLOBIN A1C: Hgb A1c MFr Bld: 9 % — ABNORMAL HIGH (ref 4.6–6.5)

## 2020-11-29 MED ORDER — TRULICITY 1.5 MG/0.5ML ~~LOC~~ SOAJ: 1.5000 mg | SUBCUTANEOUS | 1 refills | Status: DC

## 2020-11-29 MED ORDER — LEVEMIR FLEXTOUCH 100 UNIT/ML ~~LOC~~ SOPN: 50.0000 [IU] | PEN_INJECTOR | SUBCUTANEOUS | 1 refills | Status: DC

## 2020-11-29 NOTE — Patient Instructions (Signed)

## 2020-11-29 NOTE — Assessment & Plan Note (Signed)
Well controlled, no changes to meds. Encouraged heart healthy diet such as the DASH diet and exercise as tolerated.  °

## 2020-11-29 NOTE — Assessment & Plan Note (Addendum)
hgba1c unacceptable, minimize simple carbs. Increase exercise as tolerated. Continue current meds. He notes he has not been exercising or eating great. Eye exam is due next month. He is going to make an appt. Increase Levemir by 2 units and monitor sugars.

## 2020-11-29 NOTE — Assessment & Plan Note (Signed)
Eye exam is

## 2020-11-29 NOTE — Progress Notes (Signed)
Subjective:    Patient ID: Curtis Kennedy, male    DOB: Jun 12, 1966, 55 y.o.   MRN: [id number]  Chief Complaint  Patient presents with   Follow-up    HPI Patient is in today for follow up on chronic medical concerns. No recent febrile illness or hospitalizations. No polyuria or polydipsia. Is not exercising well. He is noting his sugars will probably be up. Is recovering from [place]. His cough is improved. Denies CP/palp/SOB/HA/congestion/fevers/GI or GU c/o. Taking meds as prescribed  Past Medical History:  Diagnosis Date   Anemia 07/02/2017   Colitis, ulcerative ([place]) Dx'd 2001   Dr. Erlene Quan; [facility]--has been in remission since about 2009   Diabetes mellitus without complication ([place]) 55 yrs old   type 2 (dx'd after long courses of prednisone for his UC)   Erectile dysfunction    Family history of tinea corporis 07/25/2012   Hyperlipidemia    Hypertension    Insomnia    Low back pain 08/07/2016   Preventative health care 12/31/2016   Right shoulder pain 12/31/2016   Sinusitis 08/28/2015    Past Surgical History:  Procedure Laterality Date   TRIGGER FINGER RELEASE  2011   b/l middle fingers    Family History  Problem Relation Age of Onset   Diabetes Father    Hyperlipidemia Father    Hypertension Father    Kidney disease Father        Kidney Failure   Heart disease Father 16       MI   Hypertension Sister    Hypertension Brother    Diabetes Paternal Grandfather    Hypertension Brother    Hyperlipidemia Brother    Heart disease Brother        Valve Surgery ?   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     Lives in [place], grew up in [place]--relocated to [facility] 2007.   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         Social Determinants of Health   Financial Resource Strain: Not on file  Food Insecurity: Not on file  Transportation Needs: Not on file  Physical Activity: Not on file  Stress: Not on file  Social Connections: Not on file  Intimate Partner Violence: Not on file    Outpatient Medications Prior to Visit  Medication Sig Dispense Refill   albuterol (PROVENTIL HFA;VENTOLIN HFA) 108 (90 Base) MCG/ACT inhaler Inhale 2 puffs into the lungs every 6 (six) hours as needed for wheezing or shortness of breath. 1 Inhaler 0   azaTHIOprine (IMURAN) 50 MG tablet TAKE 4.5 TABLETS BY MOUTH DAILY 405 tablet 1  balsalazide (COLAZAL) 750 MG capsule Take 3 capsules (2,250 mg total) by mouth 3 (three) times daily. 270 capsule 3   Continuous Blood Gluc Sensor (FREESTYLE LIBRE 14 DAY SENSOR) MISC 1 Device by Does not apply route every 14 (fourteen) days. 6 each 3   fenofibrate 160 MG tablet Take 1 tablet (160 mg total) by mouth daily. 90 tablet 1   fluticasone (FLONASE) 50 MCG/ACT nasal spray Place 2 sprays into both nostrils daily. 16 g 5   fluticasone (FLOVENT HFA) 110 MCG/ACT inhaler Inhale 2 puffs into the lungs 2 (two) times daily. 1 Inhaler 2   HYDROcodone-homatropine (HYCODAN) 5-1.5 MG/5ML syrup Take 5 mLs by mouth every 6 (six) hours as needed for cough. 150 mL 0   Insulin Pen Needle (BD PEN NEEDLE NANO U/F) 32G X 4 MM MISC USE 1 NEEDLE DAILY AS DIRECTED 100 each 11   Lancets (ONETOUCH DELICA PLUS [id number]) MISC USE AS  DIRECTED TWICE DAILY TO CHECK BLOOD SUGAR 100 each 0   levocetirizine (XYZAL) 5 MG tablet Take 1 tablet (5 mg total) by mouth every evening. 90 tablet 3   losartan (COZAAR) 100 MG tablet Take 1 tablet (100 mg total) by mouth daily. 90 tablet 1   meloxicam (MOBIC) 15 MG tablet Take 15 mg by mouth daily.     metFORMIN (GLUCOPHAGE) 1000 MG tablet Take 1 tablet (1,000 mg total) by mouth 2 (two) times daily with a meal. 180 tablet 1   methocarbamol (ROBAXIN) 500 MG tablet Take 1 tablet (500 mg total) by mouth every 6 (six) hours as needed for muscle spasms. 60 tablet 2   olopatadine (PATANOL) 0.1 % ophthalmic solution Place 1 drop into both eyes 2 (two) times daily. 5 mL 3   Pitavastatin Calcium (LIVALO) 4 MG TABS Take 1 tablet (4 mg total) by mouth daily. 90 tablet 1   sildenafil (VIAGRA) 100 MG tablet Take 0.5-1 tablets (50-100 mg total) by mouth daily as needed for erectile dysfunction. 90 tablet 2   traMADol (ULTRAM) 50 MG tablet Take 1 tablet (50 mg total) by mouth every 8 (eight) hours as needed. 60 tablet 0   valACYclovir (VALTREX) 1000 MG tablet Take 1 tablet (1,000 mg total) by mouth 2 (two) times daily as needed. 60 tablet 3   zaleplon (SONATA) 10 MG capsule Take 1 capsule (10 mg total) by mouth at bedtime as needed for sleep. 30 capsule 2   Dulaglutide (TRULICITY) 1.5 DE/0.8XK SOPN Inject 0.5 mLs (1.5 mg total) into the skin once a week. 12 mL 1   insulin detemir (LEVEMIR FLEXTOUCH) 100 UNIT/ML FlexPen Inject 50 Units into the skin every morning. 45 mL 1   No facility-administered medications prior to visit.    Allergies  Allergen Reactions   Simvastatin     Myalgias    Review of Systems  Constitutional: Positive for malaise/fatigue. Negative for fever.  HENT: Negative for congestion.   Eyes: Negative for blurred vision.  Respiratory: Negative for shortness of breath.   Cardiovascular: Negative for chest pain, palpitations and leg swelling.  Gastrointestinal:  Negative for abdominal pain, blood in stool and nausea.  Genitourinary: Negative for dysuria and frequency.  Musculoskeletal: Negative for falls.  Skin: Negative for rash.  Neurological: Negative for dizziness, loss of consciousness and headaches.  Endo/Heme/Allergies: Negative for environmental allergies.  Psychiatric/Behavioral: Negative for depression. The patient is not nervous/anxious.        Objective:    Physical Exam Vitals and nursing note reviewed.  Constitutional:  General: He is not in acute distress.    Appearance: He is well-developed and well-nourished.  HENT:     Head: Normocephalic and atraumatic.     Nose: Nose normal.  Eyes:     General:        Right eye: No discharge.        Left eye: No discharge.  Cardiovascular:     Rate and Rhythm: Normal rate and regular rhythm.     Heart sounds: No murmur heard.   Pulmonary:     Effort: Pulmonary effort is normal.     Breath sounds: Normal breath sounds.  Abdominal:     General: Bowel sounds are normal.     Palpations: Abdomen is soft.     Tenderness: There is no abdominal tenderness.  Musculoskeletal:        General: No edema.     Cervical back: Normal range of motion and neck supple.  Skin:    General: Skin is warm and dry.  Neurological:     Mental Status: He is alert and oriented to person, place, and time.  Psychiatric:        Mood and Affect: Mood and affect normal.     BP 126/68    Pulse 83    Temp 98.3 F (36.8 C)    Resp 16    Wt 177 lb 3.2 oz (80.4 kg)    SpO2 98%    BMI 28.60 kg/m  Wt Readings from Last 3 Encounters:  11/29/20 177 lb 3.2 oz (80.4 kg)  07/07/20 174 lb 3.2 oz (79 kg)  03/10/20 170 lb 3.2 oz (77.2 kg)    Diabetic Foot Exam - Simple   Simple Foot Form Visual Inspection No deformities, no ulcerations, no other skin breakdown bilaterally: Yes Sensation Testing Intact to touch and monofilament testing bilaterally: Yes Pulse Check Posterior Tibialis and Dorsalis pulse  intact bilaterally: Yes Comments    Lab Results  Component Value Date   WBC 3.9 (L) 11/29/2020   HGB 12.8 (L) 11/29/2020   HCT 36.5 (L) 11/29/2020   PLT 194.0 11/29/2020   GLUCOSE 197 (H) 11/29/2020   CHOL 131 11/29/2020   TRIG 113.0 11/29/2020   HDL 39.80 11/29/2020   LDLCALC 69 11/29/2020   ALT 32 11/29/2020   AST 27 11/29/2020   NA 139 11/29/2020   K 4.1 11/29/2020   CL 105 11/29/2020   CREATININE 0.85 11/29/2020   BUN 10 11/29/2020   CO2 27 11/29/2020   TSH 2.22 11/29/2020   PSA 0.72 03/10/2020   HGBA1C 9.0 (H) 11/29/2020   MICROALBUR 7.3 (H) 11/24/2015    Lab Results  Component Value Date   TSH 2.22 11/29/2020   Lab Results  Component Value Date   WBC 3.9 (L) 11/29/2020   HGB 12.8 (L) 11/29/2020   HCT 36.5 (L) 11/29/2020   MCV 96.9 11/29/2020   PLT 194.0 11/29/2020   Lab Results  Component Value Date   NA 139 11/29/2020   K 4.1 11/29/2020   CO2 27 11/29/2020   GLUCOSE 197 (H) 11/29/2020   BUN 10 11/29/2020   CREATININE 0.85 11/29/2020   BILITOT 0.8 11/29/2020   ALKPHOS 55 11/29/2020   AST 27 11/29/2020   ALT 32 11/29/2020   PROT 7.2 11/29/2020   ALBUMIN 4.4 11/29/2020   CALCIUM 9.3 11/29/2020   GFR 97.40 11/29/2020   Lab Results  Component Value Date   CHOL 131 11/29/2020   Lab Results  Component Value Date  HDL 39.80 11/29/2020   Lab Results  Component Value Date   LDLCALC 69 11/29/2020   Lab Results  Component Value Date   TRIG 113.0 11/29/2020   Lab Results  Component Value Date   CHOLHDL 3 11/29/2020   Lab Results  Component Value Date   HGBA1C 9.0 (H) 11/29/2020       Assessment & Plan:   Problem List Items Addressed This Visit    Hypertension    Well controlled, no changes to meds. Encouraged heart healthy diet such as the DASH diet and exercise as tolerated.       Relevant Orders   CBC (Completed)   Comprehensive metabolic panel (Completed)   TSH (Completed)   Hyperlipidemia    Encouraged heart healthy diet,  increase exercise, avoid trans fats, consider a krill oil cap daily      Relevant Orders   Lipid panel (Completed)   Colitis, ulcerative ([place])    Well controlled but over due for colonoscopy. He agrees to referral to gastroenterology for ongoing care.       Relevant Orders   Ambulatory referral to Gastroenterology   Type 2 diabetes mellitus with hyperglycemia ([place])    hgba1c unacceptable, minimize simple carbs. Increase exercise as tolerated. Continue current meds. He notes he has not been exercising or eating great. Eye exam is due next month. He is going to make an appt. Increase Levemir by 2 units and monitor sugars.      Relevant Medications   insulin detemir (LEVEMIR FLEXTOUCH) 100 UNIT/ML FlexPen   Dulaglutide (TRULICITY) 1.5 HB/7.1IR SOPN   Encounter for diabetic foot exam ([place])    Eye exam is       Relevant Medications   insulin detemir (LEVEMIR FLEXTOUCH) 100 UNIT/ML FlexPen   Dulaglutide (TRULICITY) 1.5 CV/8.9FY SOPN   COVID-19    Has spent the last month recovering from [place]. He was vaccinated so he did fairly well but he is struggling with fatigue some. Had a bad cough but it has largely resolved. Had 2 days of myalgias, no hypoxia or fever. Will wait on booster       Other Visit Diagnoses    Colon cancer screening    -  Primary   Relevant Orders   Ambulatory referral to Gastroenterology      I have changed Margit Banda Kennedy "Curtis Kennedy"'s Trulicity. I am also having him maintain his meloxicam, albuterol, fluticasone, traMADol, levocetirizine, fluticasone, zaleplon, methocarbamol, OneTouch Delica Plus [id number], BD Pen Needle Nano U/F, fenofibrate, Livalo, azaTHIOprine, olopatadine, valACYclovir, balsalazide, sildenafil, FreeStyle Libre 14 Day Sensor, metFORMIN, HYDROcodone-homatropine, losartan, and Levemir FlexTouch.  Meds ordered this encounter  Medications   insulin detemir (LEVEMIR FLEXTOUCH) 100 UNIT/ML FlexPen    Sig: Inject 50 Units into the skin every  morning.    Dispense:  45 mL    Refill:  1   Dulaglutide (TRULICITY) 1.5 EN/2.7PO SOPN    Sig: Inject 1.5 mg into the skin once a week.    Dispense:  12 mL    Refill:  1     Penni Homans, MD

## 2020-11-29 NOTE — Assessment & Plan Note (Signed)
Encouraged heart healthy diet, increase exercise, avoid trans fats, consider a krill oil cap daily 

## 2020-11-29 NOTE — Assessment & Plan Note (Signed)
Has spent the last month recovering from [place]. He was vaccinated so he did fairly well but he is struggling with fatigue some. Had a bad cough but it has largely resolved. Had 2 days of myalgias, no hypoxia or fever. Will wait on booster

## 2020-11-30 NOTE — Assessment & Plan Note (Signed)
Well controlled but over due for colonoscopy. He agrees to referral to gastroenterology for ongoing care.

## 2021-01-03 ENCOUNTER — Telehealth: Payer: Self-pay | Admitting: *Deleted

## 2021-01-03 NOTE — Telephone Encounter (Signed)
ERROR

## 2021-01-04 ENCOUNTER — Encounter: Payer: Self-pay | Admitting: Family Medicine

## 2021-01-05 MED ORDER — BD PEN NEEDLE NANO U/F 32G X 4 MM MISC: 11 refills | Status: DC

## 2021-01-11 ENCOUNTER — Encounter: Payer: Self-pay | Admitting: Family Medicine

## 2021-01-11 DIAGNOSIS — I10 Essential (primary) hypertension: Secondary | ICD-10-CM

## 2021-01-11 DIAGNOSIS — E119 Type 2 diabetes mellitus without complications: Secondary | ICD-10-CM

## 2021-01-11 DIAGNOSIS — E785 Hyperlipidemia, unspecified: Secondary | ICD-10-CM

## 2021-01-11 DIAGNOSIS — E7849 Other hyperlipidemia: Secondary | ICD-10-CM

## 2021-01-12 MED ORDER — FENOFIBRATE 160 MG PO TABS: 160.0000 mg | ORAL_TABLET | Freq: Every day | ORAL | 1 refills | Status: DC

## 2021-01-12 MED ORDER — LIVALO 4 MG PO TABS: 1.0000 | ORAL_TABLET | Freq: Every day | ORAL | 1 refills | Status: DC

## 2021-01-12 MED ORDER — LOSARTAN POTASSIUM 100 MG PO TABS: 100.0000 mg | ORAL_TABLET | Freq: Every day | ORAL | 1 refills | Status: DC

## 2021-01-12 MED ORDER — METFORMIN HCL 1000 MG PO TABS: 1000.0000 mg | ORAL_TABLET | Freq: Two times a day (BID) | ORAL | 1 refills | Status: DC

## 2021-01-13 ENCOUNTER — Encounter: Payer: Self-pay | Admitting: Family Medicine

## 2021-01-13 MED ORDER — LEVOCETIRIZINE DIHYDROCHLORIDE 5 MG PO TABS: 5.0000 mg | ORAL_TABLET | Freq: Every evening | ORAL | 3 refills | Status: DC

## 2021-02-14 ENCOUNTER — Telehealth: Payer: Self-pay | Admitting: *Deleted

## 2021-02-14 NOTE — Telephone Encounter (Signed)
Received form for Dexcom.  Patient requested to receive samples of Dexcom and company is needing prescriptions to move forward after patient tries and would like to keep.    Form given to provider to sign.

## 2021-02-15 ENCOUNTER — Other Ambulatory Visit: Payer: Self-pay | Admitting: Family Medicine

## 2021-02-15 DIAGNOSIS — I10 Essential (primary) hypertension: Secondary | ICD-10-CM

## 2021-02-15 DIAGNOSIS — E785 Hyperlipidemia, unspecified: Secondary | ICD-10-CM

## 2021-02-15 DIAGNOSIS — E119 Type 2 diabetes mellitus without complications: Secondary | ICD-10-CM

## 2021-02-17 MED ORDER — DEXCOM G6 TRANSMITTER MISC: 1 refills | Status: DC

## 2021-02-17 MED ORDER — DEXCOM G6 RECEIVER DEVI: 0 refills | Status: DC

## 2021-02-17 MED ORDER — DEXCOM G6 SENSOR MISC: 0 refills | Status: DC

## 2021-02-17 NOTE — Telephone Encounter (Signed)
Form faxed and MAR updated.

## 2021-04-10 ENCOUNTER — Encounter: Payer: Self-pay | Admitting: Family Medicine

## 2021-04-10 ENCOUNTER — Ambulatory Visit (INDEPENDENT_AMBULATORY_CARE_PROVIDER_SITE_OTHER): Payer: 59 | Admitting: Family Medicine

## 2021-04-10 ENCOUNTER — Other Ambulatory Visit: Payer: Self-pay

## 2021-04-10 VITALS — BP 128/72 | HR 89 | Temp 98.3°F | Resp 12 | Ht 66.0 in | Wt 175.2 lb

## 2021-04-10 DIAGNOSIS — K5180 Other ulcerative colitis without complications: Secondary | ICD-10-CM

## 2021-04-10 DIAGNOSIS — I10 Essential (primary) hypertension: Secondary | ICD-10-CM

## 2021-04-10 DIAGNOSIS — M65342 Trigger finger, left ring finger: Secondary | ICD-10-CM | POA: Diagnosis not present

## 2021-04-10 DIAGNOSIS — N529 Male erectile dysfunction, unspecified: Secondary | ICD-10-CM | POA: Diagnosis not present

## 2021-04-10 DIAGNOSIS — G4700 Insomnia, unspecified: Secondary | ICD-10-CM

## 2021-04-10 DIAGNOSIS — D649 Anemia, unspecified: Secondary | ICD-10-CM

## 2021-04-10 DIAGNOSIS — E782 Mixed hyperlipidemia: Secondary | ICD-10-CM

## 2021-04-10 DIAGNOSIS — E1165 Type 2 diabetes mellitus with hyperglycemia: Secondary | ICD-10-CM | POA: Diagnosis not present

## 2021-04-10 LAB — CBC
HCT: 36.7 % — ABNORMAL LOW (ref 39.0–52.0)
Hemoglobin: 13.1 g/dL (ref 13.0–17.0)
MCHC: 35.7 g/dL (ref 30.0–36.0)
MCV: 94.3 fl (ref 78.0–100.0)
Platelets: 203 10*3/uL (ref 150.0–400.0)
RBC: 3.89 Mil/uL — ABNORMAL LOW (ref 4.22–5.81)
RDW: 15.4 % (ref 11.5–15.5)
WBC: 5.4 10*3/uL (ref 4.0–10.5)

## 2021-04-10 LAB — LIPID PANEL
Cholesterol: 155 mg/dL (ref 0–200)
HDL: 38.6 mg/dL — ABNORMAL LOW (ref >39.00)
LDL Cholesterol: 83 mg/dL (ref 0–99)
NonHDL: 116.84
Total CHOL/HDL Ratio: 4
Triglycerides: 170 mg/dL — ABNORMAL HIGH (ref 0.0–149.0)
VLDL: 34 mg/dL (ref 0.0–40.0)

## 2021-04-10 LAB — COMPREHENSIVE METABOLIC PANEL
ALT: 27 U/L (ref 0–53)
AST: 24 U/L (ref 0–37)
Albumin: 4.8 g/dL (ref 3.5–5.2)
Alkaline Phosphatase: 58 U/L (ref 39–117)
BUN: 13 mg/dL (ref 6–23)
CO2: 26 mEq/L (ref 19–32)
Calcium: 9.7 mg/dL (ref 8.4–10.5)
Chloride: 100 mEq/L (ref 96–112)
Creatinine, Ser: 0.88 mg/dL (ref 0.40–1.50)
GFR: 96.14 mL/min (ref >60.00)
Glucose, Bld: 287 mg/dL — ABNORMAL HIGH (ref 70–99)
Potassium: 4.3 mEq/L (ref 3.5–5.1)
Sodium: 137 mEq/L (ref 135–145)
Total Bilirubin: 1 mg/dL (ref 0.2–1.2)
Total Protein: 7.4 g/dL (ref 6.0–8.3)

## 2021-04-10 LAB — HEMOGLOBIN A1C: Hgb A1c MFr Bld: 9.2 % — ABNORMAL HIGH (ref 4.6–6.5)

## 2021-04-10 LAB — PSA: PSA: 0.81 ng/mL (ref 0.10–4.00)

## 2021-04-10 LAB — TSH: TSH: 2.24 u[IU]/mL (ref 0.35–4.50)

## 2021-04-10 MED ORDER — AMITRIPTYLINE HCL 10 MG PO TABS: 5.0000 mg | ORAL_TABLET | Freq: Every evening | ORAL | 1 refills | Status: DC | PRN

## 2021-04-10 NOTE — Patient Instructions (Addendum)
Shingrix is the new shingles shot, 2 shots over 2-6 months, confirm coverage with insurance and document, then can return here for shots with nurse appt or at pharmacy   Make an opthamology appointment  Make an appointment for colonoscopy   Recommend second booster for COVID  For Sleep  Melatonin 2-10 mg at bedtime  L Tryptophan  Magnesium Glycinate 200-400 mg   NOW Sleep + (can take with Magnesium Glycinate)  MindBodyGreen support  (can take with L Tryptophan) Insomnia Insomnia is a sleep disorder that makes it difficult to fall asleep or stay asleep. Insomnia can cause fatigue, low energy, difficulty concentrating, moodswings, and poor performance at work or school. There are three different ways to classify insomnia: Difficulty falling asleep. Difficulty staying asleep. Waking up too early in the morning. Any type of insomnia can be long-term (chronic) or short-term (acute). Both are common. Short-term insomnia usually lasts for three months or less. Chronic insomnia occurs at least three times a week for longer than threemonths. What are the causes? Insomnia may be caused by another condition, situation, or substance, such as: Anxiety. Certain medicines. Gastroesophageal reflux disease (GERD) or other gastrointestinal conditions. Asthma or other breathing conditions. Restless legs syndrome, sleep apnea, or other sleep disorders. Chronic pain. Menopause. Stroke. Abuse of alcohol, tobacco, or illegal drugs. Mental health conditions, such as depression. Caffeine. Neurological disorders, such as Alzheimer's disease. An overactive thyroid (hyperthyroidism). Sometimes, the cause of insomnia may not be known. What increases the risk? Risk factors for insomnia include: Gender. Women are affected more often than men. Age. Insomnia is more common as you get older. Stress. Lack of exercise. Irregular work schedule or working night shifts. Traveling between different time  zones. Certain medical and mental health conditions. What are the signs or symptoms? If you have insomnia, the main symptom is having trouble falling asleep or having trouble staying asleep. This may lead to other symptoms, such as: Feeling fatigued or having low energy. Feeling nervous about going to sleep. Not feeling rested in the morning. Having trouble concentrating. Feeling irritable, anxious, or depressed. How is this diagnosed? This condition may be diagnosed based on: Your symptoms and medical history. Your health care provider may ask about: Your sleep habits. Any medical conditions you have. Your mental health. A physical exam. How is this treated? Treatment for insomnia depends on the cause. Treatment may focus on treating an underlying condition that is causing insomnia. Treatment may also include: Medicines to help you sleep. Counseling or therapy. Lifestyle adjustments to help you sleep better. Follow these instructions at home: Eating and drinking  Limit or avoid alcohol, caffeinated beverages, and cigarettes, especially close to bedtime. These can disrupt your sleep. Do not eat a large meal or eat spicy foods right before bedtime. This can lead to digestive discomfort that can make it hard for you to sleep.  Sleep habits  Keep a sleep diary to help you and your health care provider figure out what could be causing your insomnia. Write down: When you sleep. When you wake up during the night. How well you sleep. How rested you feel the next day. Any side effects of medicines you are taking. What you eat and drink. Make your bedroom a dark, comfortable place where it is easy to fall asleep. Put up shades or blackout curtains to block light from outside. Use a white noise machine to block noise. Keep the temperature cool. Limit screen use before bedtime. This includes: Watching TV. Using your smartphone, tablet,  or computer. Stick to a routine that includes  going to bed and waking up at the same times every day and night. This can help you fall asleep faster. Consider making a quiet activity, such as reading, part of your nighttime routine. Try to avoid taking naps during the day so that you sleep better at night. Get out of bed if you are still awake after 15 minutes of trying to sleep. Keep the lights down, but try reading or doing a quiet activity. When you feel sleepy, go back to bed.  General instructions Take over-the-counter and prescription medicines only as told by your health care provider. Exercise regularly, as told by your health care provider. Avoid exercise starting several hours before bedtime. Use relaxation techniques to manage stress. Ask your health care provider to suggest some techniques that may work well for you. These may include: Breathing exercises. Routines to release muscle tension. Visualizing peaceful scenes. Make sure that you drive carefully. Avoid driving if you feel very sleepy. Keep all follow-up visits as told by your health care provider. This is important. Contact a health care provider if: You are tired throughout the day. You have trouble in your daily routine due to sleepiness. You continue to have sleep problems, or your sleep problems get worse. Get help right away if: You have serious thoughts about hurting yourself or someone else. If you ever feel like you may hurt yourself or others, or have thoughts about taking your own life, get help right away. You can go to your nearest emergency department or call: Your local emergency services (911 in the [place] A suicide crisis helpline, such as the [facility] at 705-858-0632. This is open 24 hours a day. Summary Insomnia is a sleep disorder that makes it difficult to fall asleep or stay asleep. Insomnia can be long-term (chronic) or short-term (acute). Treatment for insomnia depends on the cause. Treatment may focus on treating  an underlying condition that is causing insomnia. Keep a sleep diary to help you and your health care provider figure out what could be causing your insomnia. This information is not intended to replace advice given to you by your health care provider. Make sure you discuss any questions you have with your healthcare provider. Document Revised: 08/11/2020 Document Reviewed: 08/11/2020 Elsevier Patient Education  2022 [employer].

## 2021-04-10 NOTE — Assessment & Plan Note (Signed)
Tolerating statin, encouraged heart healthy diet, avoid trans fats, minimize simple carbs and saturated fats. Increase exercise as tolerated 

## 2021-04-10 NOTE — Progress Notes (Signed)
Subjective:    Patient ID: Curtis Kennedy, male    DOB: 03/01/1966, 55 y.o.   MRN: [id number]  Chief Complaint  Patient presents with   3 month follow up    HPI Patient is in today for chronic medical concerns including hypertension, diabetes and more. His greatest stress if caring for elderly parents and his 22 year old is home from college. He has not been taking his sugars but he knows it will not be good he is resistant to seeing endocrinology but will consider. no recent febrile illness or hospitalization. Well controlled, no changes to meds. Encouraged heart healthy diet such as the DASH diet and exercise as tolerated.    Past Medical History:  Diagnosis Date   Anemia 07/02/2017   Colitis, ulcerative ([place]) Dx'd 2001   Dr. Erlene Quan; [facility]--has been in remission since about 2009   Diabetes mellitus without complication ([place]) 55 yrs old   type 2 (dx'd after long courses of prednisone for his UC)   Erectile dysfunction    Family history of tinea corporis 07/25/2012   Hyperlipidemia    Hypertension    Insomnia    Low back pain 08/07/2016   Preventative health care 12/31/2016   Right shoulder pain 12/31/2016   Sinusitis 08/28/2015    Past Surgical History:  Procedure Laterality Date   TRIGGER FINGER RELEASE  2011   b/l middle fingers    Family History  Problem Relation Age of Onset   Diabetes Father    Hyperlipidemia Father    Hypertension Father    Kidney disease Father        Kidney Failure   Heart disease Father 64       MI   Hypertension Sister    Hypertension Brother    Diabetes Paternal Grandfather    Hypertension Brother    Hyperlipidemia Brother    Heart disease Brother        Valve Surgery ?   Diabetes Brother    Hypertension Brother    ADD / ADHD Brother     Social History   Socioeconomic History   Marital status: Married    Spouse name: Not on file   Number of children: Not on file   Years of education: Not on file   Highest education  level: Not on file  Occupational History   Not on file  Tobacco Use   Smoking status: Never   Smokeless tobacco: Never  Substance and Sexual Activity   Alcohol use: No   Drug use: No   Sexual activity: Yes    Partners: Female    Comment: lives with wife, does office work follows with  Other Topics Concern   Not on file  Social History Narrative   Married, 57 y/o girl.     Lives in [place], grew up in [place]--relocated to [facility] 2007.   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         Social Determinants of Health   Financial Resource Strain: Not on file  Food Insecurity: Not on file  Transportation Needs: Not on file  Physical Activity: Not on file  Stress: Not on file  Social Connections: Not on file  Intimate Partner Violence: Not on file    Outpatient Medications Prior to Visit  Medication Sig Dispense Refill   albuterol (PROVENTIL HFA;VENTOLIN HFA) 108 (90 Base) MCG/ACT inhaler Inhale 2 puffs into the lungs  every 6 (six) hours as needed for wheezing or shortness of breath. 1 Inhaler 0   azaTHIOprine (IMURAN) 50 MG tablet TAKE 4.5 TABLETS BY MOUTH EVERY DAY 405 tablet 1   balsalazide (COLAZAL) 750 MG capsule Take 3 capsules (2,250 mg total) by mouth 3 (three) times daily. 270 capsule 3   Dulaglutide (TRULICITY) 1.5 AQ/7.6AU SOPN Inject 1.5 mg into the skin once a week. 12 mL 1   fenofibrate 160 MG tablet Take 1 tablet (160 mg total) by mouth daily. 90 tablet 1   fluticasone (FLONASE) 50 MCG/ACT nasal spray Place 2 sprays into both nostrils daily. 16 g 5   fluticasone (FLOVENT HFA) 110 MCG/ACT inhaler Inhale 2 puffs into the lungs 2 (two) times daily. 1 Inhaler 2   HYDROcodone-homatropine (HYCODAN) 5-1.5 MG/5ML syrup Take 5 mLs by mouth every 6 (six) hours as needed for cough. 150 mL 0   insulin detemir (LEVEMIR FLEXTOUCH) 100 UNIT/ML FlexPen Inject 50 Units into the skin every morning. 45 mL 1   Insulin  Pen Needle (BD PEN NEEDLE NANO U/F) 32G X 4 MM MISC USE 1 NEEDLE DAILY AS DIRECTED 100 each 11   Lancets (ONETOUCH DELICA PLUS [id number]) MISC USE AS DIRECTED TWICE DAILY TO CHECK BLOOD SUGAR 100 each 0   levocetirizine (XYZAL) 5 MG tablet Take 1 tablet (5 mg total) by mouth every evening. 90 tablet 3   losartan (COZAAR) 100 MG tablet Take 1 tablet (100 mg total) by mouth daily. 90 tablet 1   meloxicam (MOBIC) 15 MG tablet Take 15 mg by mouth daily.     metFORMIN (GLUCOPHAGE) 1000 MG tablet Take 1 tablet (1,000 mg total) by mouth 2 (two) times daily with a meal. 180 tablet 1   methocarbamol (ROBAXIN) 500 MG tablet Take 1 tablet (500 mg total) by mouth every 6 (six) hours as needed for muscle spasms. 60 tablet 2   olopatadine (PATANOL) 0.1 % ophthalmic solution Place 1 drop into both eyes 2 (two) times daily. 5 mL 3   Pitavastatin Calcium (LIVALO) 4 MG TABS Take 1 tablet (4 mg total) by mouth daily. 90 tablet 1   sildenafil (VIAGRA) 100 MG tablet Take 0.5-1 tablets (50-100 mg total) by mouth daily as needed for erectile dysfunction. 90 tablet 2   traMADol (ULTRAM) 50 MG tablet Take 1 tablet (50 mg total) by mouth every 8 (eight) hours as needed. 60 tablet 0   valACYclovir (VALTREX) 1000 MG tablet Take 1 tablet (1,000 mg total) by mouth 2 (two) times daily as needed. 60 tablet 3   zaleplon (SONATA) 10 MG capsule Take 1 capsule (10 mg total) by mouth at bedtime as needed for sleep. 30 capsule 2   Continuous Blood Gluc Receiver (DEXCOM G6 RECEIVER) DEVI Use for continuous blood glucose monitoring (Patient not taking: Reported on 04/10/2021) 1 each 0   Continuous Blood Gluc Sensor (DEXCOM G6 SENSOR) MISC Use for continuous blood glucose monitoring.  Replace every 10 days. (Patient not taking: Reported on 04/10/2021) 4 each 0   Continuous Blood Gluc Transmit (DEXCOM G6 TRANSMITTER) MISC Use for continuous blood glucose monitoring.  Replace every 90 days (Patient not taking: Reported on 04/10/2021) 1 each 1    No facility-administered medications prior to visit.    Allergies  Allergen Reactions   Simvastatin     Myalgias    Review of Systems  Constitutional:  Negative for fever and malaise/fatigue.  HENT:  Negative for congestion.   Eyes:  Negative for blurred vision.  Respiratory:  Negative for shortness of breath.   Cardiovascular:  Negative for chest pain, palpitations and leg swelling.  Gastrointestinal:  Negative for abdominal pain, blood in stool and nausea.  Genitourinary:  Negative for dysuria and frequency.  Musculoskeletal:  Negative for falls.  Skin:  Negative for rash.  Neurological:  Negative for dizziness, loss of consciousness and headaches.  Endo/Heme/Allergies:  Negative for environmental allergies.  Psychiatric/Behavioral:  Negative for depression. The patient is not nervous/anxious.       Objective:    Physical Exam Constitutional:      General: He is not in acute distress.    Appearance: Normal appearance. He is not ill-appearing or toxic-appearing.  HENT:     Head: Normocephalic and atraumatic.     Right Ear: External ear normal.     Left Ear: External ear normal.     Nose: Nose normal.  Eyes:     General:        Right eye: No discharge.        Left eye: No discharge.     Extraocular Movements: Extraocular movements intact.     Pupils: Pupils are equal, round, and reactive to light.  Cardiovascular:     Rate and Rhythm: Normal rate and regular rhythm.     Pulses: Normal pulses.  Pulmonary:     Effort: Pulmonary effort is normal.  Musculoskeletal:     Cervical back: Neck supple.     Right lower leg: No edema.     Left lower leg: No edema.  Skin:    Findings: No rash.  Neurological:     Mental Status: He is alert and oriented to person, place, and time.  Psychiatric:        Behavior: Behavior normal.    BP 128/72 (BP Location: Right Arm, Cuff Size: Large)   Pulse 89   Temp 98.3 F (36.8 C) (Oral)   Resp 12   Ht 5' 6"  (1.676 m)   Wt 175  lb 3.2 oz (79.5 kg)   SpO2 100%   BMI 28.28 kg/m  Wt Readings from Last 3 Encounters:  04/10/21 175 lb 3.2 oz (79.5 kg)  11/29/20 177 lb 3.2 oz (80.4 kg)  07/07/20 174 lb 3.2 oz (79 kg)    Diabetic Foot Exam - Simple   Simple Foot Form Diabetic Foot exam was performed with the following findings: Yes 04/10/2021  9:53 AM  Visual Inspection No deformities, no ulcerations, no other skin breakdown bilaterally: Yes Sensation Testing Intact to touch and monofilament testing bilaterally: Yes Pulse Check Posterior Tibialis and Dorsalis pulse intact bilaterally: Yes Comments    Lab Results  Component Value Date   WBC 3.9 (L) 11/29/2020   HGB 12.8 (L) 11/29/2020   HCT 36.5 (L) 11/29/2020   PLT 194.0 11/29/2020   GLUCOSE 197 (H) 11/29/2020   CHOL 131 11/29/2020   TRIG 113.0 11/29/2020   HDL 39.80 11/29/2020   LDLCALC 69 11/29/2020   ALT 32 11/29/2020   AST 27 11/29/2020   NA 139 11/29/2020   K 4.1 11/29/2020   CL 105 11/29/2020   CREATININE 0.85 11/29/2020   BUN 10 11/29/2020   CO2 27 11/29/2020   TSH 2.22 11/29/2020   PSA 0.72 03/10/2020   HGBA1C 9.0 (H) 11/29/2020   MICROALBUR 7.3 (H) 11/24/2015    Lab Results  Component Value Date   TSH 2.22 11/29/2020   Lab Results  Component Value Date   WBC 3.9 (L) 11/29/2020   HGB 12.8 (L) 11/29/2020  HCT 36.5 (L) 11/29/2020   MCV 96.9 11/29/2020   PLT 194.0 11/29/2020   Lab Results  Component Value Date   NA 139 11/29/2020   K 4.1 11/29/2020   CO2 27 11/29/2020   GLUCOSE 197 (H) 11/29/2020   BUN 10 11/29/2020   CREATININE 0.85 11/29/2020   BILITOT 0.8 11/29/2020   ALKPHOS 55 11/29/2020   AST 27 11/29/2020   ALT 32 11/29/2020   PROT 7.2 11/29/2020   ALBUMIN 4.4 11/29/2020   CALCIUM 9.3 11/29/2020   GFR 97.40 11/29/2020   Lab Results  Component Value Date   CHOL 131 11/29/2020   Lab Results  Component Value Date   HDL 39.80 11/29/2020   Lab Results  Component Value Date   LDLCALC 69 11/29/2020   Lab  Results  Component Value Date   TRIG 113.0 11/29/2020   Lab Results  Component Value Date   CHOLHDL 3 11/29/2020   Lab Results  Component Value Date   HGBA1C 9.0 (H) 11/29/2020       Assessment & Plan:   Problem List Items Addressed This Visit     Hypertension    Well controlled, no changes to meds. Encouraged heart healthy diet such as the DASH diet and exercise as tolerated.        Hyperlipidemia    Tolerating statin, encouraged heart healthy diet, avoid trans fats, minimize simple carbs and saturated fats. Increase exercise as tolerated       Colitis, ulcerative ([place])    Has been stable for a long time and he is doing well       Type 2 diabetes mellitus with hyperglycemia ([place])    hgba1c unacceptable at last checked but had improved minimize simple carbs. Increase exercise as tolerated. Continue current meds       Anemia    Mild, asymptomatic, recheck today        I am having Curtis Kennedy "Joe" maintain his meloxicam, albuterol, fluticasone, traMADol, fluticasone, zaleplon, methocarbamol, OneTouch Delica Plus [id number], olopatadine, valACYclovir, balsalazide, sildenafil, HYDROcodone-homatropine, Levemir FlexTouch, Trulicity, BD Pen Needle Nano U/F, metFORMIN, losartan, Livalo, fenofibrate, levocetirizine, azaTHIOprine, Dexcom G6 Receiver, Dexcom G6 Sensor, and Dexcom G6 Transmitter.  No orders of the defined types were placed in this encounter.    Penni Homans, MD

## 2021-04-10 NOTE — Assessment & Plan Note (Signed)
Well controlled, no changes to meds. Encouraged heart healthy diet such as the DASH diet and exercise as tolerated.  °

## 2021-04-10 NOTE — Assessment & Plan Note (Addendum)
Encouraged good sleep hygiene such as dark, quiet room. No blue/green glowing lights such as computer screens in bedroom. No alcohol or stimulants in evening. Cut down on caffeine as able. Regular exercise is helpful but not just prior to bed time.  For Sleep  Melatonin 2-10 mg at bedtime  L Tryptophan  Magnesium Glycinate 200-400 mg   NOW Sleep + (can take with Magnesium Glycinate)  MindBodyGreen support  (can take with L Tryptophan)

## 2021-04-10 NOTE — Assessment & Plan Note (Signed)
Has been stable for a long time and he is doing well

## 2021-04-10 NOTE — Assessment & Plan Note (Signed)
hgba1c unacceptable at last checked but had improved minimize simple carbs. Increase exercise as tolerated. Continue current meds

## 2021-04-10 NOTE — Assessment & Plan Note (Signed)
Mild, asymptomatic, recheck today

## 2021-04-12 ENCOUNTER — Telehealth: Payer: Self-pay | Admitting: *Deleted

## 2021-04-12 NOTE — Telephone Encounter (Signed)
Prior auth for Livalo started via cover my meds.  Awaiting determination.  Key: [id number]

## 2021-04-12 NOTE — Telephone Encounter (Signed)
This letter is to notify you that your prior authorization request for LIVALO 4 MG TABLET has been received. After review of this request, it has been determined that a prior authorization is not required. Listed below is/are the reason(s) a prior authorization is not required: This medication does not require a prior authorization because it is a covered benefit.

## 2021-04-18 ENCOUNTER — Encounter: Payer: Self-pay | Admitting: Family Medicine

## 2021-04-18 ENCOUNTER — Other Ambulatory Visit: Payer: Self-pay

## 2021-04-18 MED ORDER — ROSUVASTATIN CALCIUM 5 MG PO TABS: 5.0000 mg | ORAL_TABLET | Freq: Every day | ORAL | 1 refills | Status: DC

## 2021-04-18 NOTE — Telephone Encounter (Signed)
Medication sent.

## 2021-05-05 ENCOUNTER — Encounter: Payer: Self-pay | Admitting: Family Medicine

## 2021-05-05 ENCOUNTER — Telehealth (INDEPENDENT_AMBULATORY_CARE_PROVIDER_SITE_OTHER): Payer: 59 | Admitting: Family Medicine

## 2021-05-05 DIAGNOSIS — U071 COVID-19: Secondary | ICD-10-CM | POA: Diagnosis not present

## 2021-05-05 MED ORDER — NIRMATRELVIR/RITONAVIR (PAXLOVID)TABLET: 3.0000 | ORAL_TABLET | Freq: Two times a day (BID) | ORAL | 0 refills | Status: AC

## 2021-05-05 NOTE — Progress Notes (Signed)
MyChart Video Visit    Virtual Visit via Video Note   This visit type was conducted due to national recommendations for restrictions regarding the COVID-19 Pandemic (e.g. social distancing) in an effort to limit this patient's exposure and mitigate transmission in our community. This patient is at least at moderate risk for complications without adequate follow up. This format is felt to be most appropriate for this patient at this time. Physical exam was limited by quality of the video and audio technology used for the visit. [name]  was able to get the patient set up on a video visit.  Patient location: Home Patient and provider in visit Provider location: Office  I discussed the limitations of evaluation and management by telemedicine and the availability of in person appointments. The patient expressed understanding and agreed to proceed.  Visit Date: 05/05/2021  Today's healthcare provider: Ann Held, DO     Subjective:    Patient ID: Curtis Kennedy, male    DOB: [birth year], 55 y.o.   MRN: [id number]  Chief Complaint  Patient presents with   Covid Positive    covid + this morningsxs started  05/04/21: Fatigue and body aches- taking Tylenol    HPI Patient is in today for + covid + bodyaches, sore throat ,  no cough or fever.  Symptoms started a few hours after his wife yesterday.  He tested positive this am.   No other complaints   Past Medical History:  Diagnosis Date   Anemia 07/02/2017   Colitis, ulcerative ([place]) Dx'd 2001   Dr. Erlene Quan; [facility]--has been in remission since about 2009   Diabetes mellitus without complication ([place]) 55 yrs old   type 2 (dx'd after long courses of prednisone for his UC)   Erectile dysfunction    Family history of tinea corporis 07/25/2012   Hyperlipidemia    Hypertension    Insomnia    Low back pain 08/07/2016   Preventative health care 12/31/2016   Right shoulder pain 12/31/2016   Sinusitis 08/28/2015     Past Surgical History:  Procedure Laterality Date   TRIGGER FINGER RELEASE  2011   b/l middle fingers    Family History  Problem Relation Age of Onset   Diabetes Father    Hyperlipidemia Father    Hypertension Father    Kidney disease Father        Kidney Failure   Heart disease Father 53       MI   Hypertension Sister    Hypertension Brother    Diabetes Paternal Grandfather    Hypertension Brother    Hyperlipidemia Brother    Heart disease Brother        Valve Surgery ?   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     Lives in [place], grew up in [place]--relocated to [facility] 2007.   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         Social Determinants of Health   Financial Resource Strain: Not on file  Food Insecurity: Not on file  Transportation Needs: Not on file  Physical Activity: Not on file  Stress: Not on file  Social Connections: Not on file  Intimate Partner Violence: Not on file    Outpatient Medications Prior to Visit  Medication Sig Dispense Refill   albuterol (PROVENTIL HFA;VENTOLIN HFA) 108 (90 Base) MCG/ACT inhaler Inhale 2 puffs into the lungs every 6 (six) hours as needed for wheezing or shortness of breath. 1 Inhaler 0   amitriptyline (ELAVIL) 10 MG tablet Take 0.5-2 tablets (5-20 mg total) by  mouth at bedtime as needed for sleep. 40 tablet 1   azaTHIOprine (IMURAN) 50 MG tablet TAKE 4.5 TABLETS BY MOUTH EVERY DAY 405 tablet 1   balsalazide (COLAZAL) 750 MG capsule Take 3 capsules (2,250 mg total) by mouth 3 (three) times daily. 270 capsule 3   Continuous Blood Gluc Receiver (DEXCOM G6 RECEIVER) DEVI Use for continuous blood glucose monitoring 1 each 0   Continuous Blood Gluc Sensor (DEXCOM G6 SENSOR) MISC Use for continuous blood glucose monitoring.  Replace every 10 days. (Patient taking differently: Use for continuous blood glucose monitoring.  Replace every 10 days.) 4 each 0   Continuous Blood Gluc Transmit (DEXCOM G6 TRANSMITTER) MISC Use for continuous blood glucose monitoring.  Replace every 90 days (Patient taking differently: Use for continuous blood glucose monitoring.  Replace every 90 days) 1 each 1   Dulaglutide (TRULICITY) 1.5 SN/0.5LZ SOPN Inject 1.5 mg into the skin once a week. 12 mL 1   fenofibrate 160 MG tablet Take 1 tablet (160 mg total) by mouth daily. 90 tablet 1   fluticasone (FLONASE) 50 MCG/ACT nasal spray Place 2 sprays into both nostrils daily. 16 g 5   fluticasone (FLOVENT HFA) 110 MCG/ACT inhaler Inhale 2 puffs into the lungs 2 (two) times daily. 1 Inhaler 2   HYDROcodone-homatropine (HYCODAN) 5-1.5 MG/5ML syrup Take 5 mLs by mouth every 6 (six) hours as needed for cough. 150 mL 0   insulin detemir (LEVEMIR FLEXTOUCH) 100 UNIT/ML FlexPen Inject 50 Units into the skin every morning. 45 mL 1   Insulin Pen Needle (BD PEN NEEDLE NANO U/F) 32G X 4 MM MISC USE 1 NEEDLE DAILY AS DIRECTED 100 each 11   Lancets (ONETOUCH DELICA PLUS [id number]) MISC USE AS DIRECTED TWICE DAILY TO CHECK BLOOD SUGAR 100 each 0   levocetirizine (XYZAL) 5 MG tablet Take 1 tablet (5 mg total) by mouth every evening. 90 tablet 3   losartan (COZAAR) 100 MG tablet Take 1 tablet (100 mg total) by mouth daily. 90 tablet 1   meloxicam (MOBIC) 15 MG tablet Take 15 mg by mouth daily.     metFORMIN  (GLUCOPHAGE) 1000 MG tablet Take 1 tablet (1,000 mg total) by mouth 2 (two) times daily with a meal. 180 tablet 1   methocarbamol (ROBAXIN) 500 MG tablet Take 1 tablet (500 mg total) by mouth every 6 (six) hours as needed for muscle spasms. 60 tablet 2   olopatadine (PATANOL) 0.1 % ophthalmic solution Place 1 drop into both eyes 2 (two) times daily. 5 mL 3   rosuvastatin (CRESTOR)  5 MG tablet Take 1 tablet (5 mg total) by mouth daily. 90 tablet 1   sildenafil (VIAGRA) 100 MG tablet Take 0.5-1 tablets (50-100 mg total) by mouth daily as needed for erectile dysfunction. 90 tablet 2   traMADol (ULTRAM) 50 MG tablet Take 1 tablet (50 mg total) by mouth every 8 (eight) hours as needed. 60 tablet 0   valACYclovir (VALTREX) 1000 MG tablet Take 1 tablet (1,000 mg total) by mouth 2 (two) times daily as needed. 60 tablet 3   zaleplon (SONATA) 10 MG capsule Take 1 capsule (10 mg total) by mouth at bedtime as needed for sleep. 30 capsule 2   No facility-administered medications prior to visit.    Allergies  Allergen Reactions   Simvastatin     Myalgias    Review of Systems  Constitutional:  Positive for malaise/fatigue. Negative for diaphoresis and weight loss.  HENT:  Positive for congestion and sore throat.   Eyes:  Negative for pain, discharge and redness.  Respiratory:  Positive for cough. Negative for hemoptysis, sputum production, shortness of breath and wheezing.   Cardiovascular:  Negative for orthopnea, claudication and leg swelling.  Gastrointestinal:  Negative for blood in stool, constipation and melena.  Genitourinary:  Negative for frequency, hematuria and urgency.  Musculoskeletal:  Positive for myalgias. Negative for falls.  Neurological:  Negative for tingling and seizures.      Objective:    Physical Exam Vitals and nursing note reviewed.  HENT:     Head: Normocephalic and atraumatic.  Neurological:     Mental Status: He is alert and oriented to person, place, and time.   Psychiatric:        Behavior: Behavior normal.        Thought Content: Thought content normal.    There were no vitals taken for this visit. Wt Readings from Last 3 Encounters:  04/10/21 175 lb 3.2 oz (79.5 kg)  11/29/20 177 lb 3.2 oz (80.4 kg)  07/07/20 174 lb 3.2 oz (79 kg)    Diabetic Foot Exam - Simple   No data filed    Lab Results  Component Value Date   WBC 5.4 04/10/2021   HGB 13.1 04/10/2021   HCT 36.7 (L) 04/10/2021   PLT 203.0 04/10/2021   GLUCOSE 287 (H) 04/10/2021   CHOL 155 04/10/2021   TRIG 170.0 (H) 04/10/2021   HDL 38.60 (L) 04/10/2021   LDLCALC 83 04/10/2021   ALT 27 04/10/2021   AST 24 04/10/2021   NA 137 04/10/2021   K 4.3 04/10/2021   CL 100 04/10/2021   CREATININE 0.88 04/10/2021   BUN 13 04/10/2021   CO2 26 04/10/2021   TSH 2.24 04/10/2021   PSA 0.81 04/10/2021   HGBA1C 9.2 (H) 04/10/2021   MICROALBUR 7.3 (H) 11/24/2015    Lab Results  Component Value Date   TSH 2.24 04/10/2021   Lab Results  Component Value Date   WBC 5.4 04/10/2021   HGB 13.1 04/10/2021   HCT 36.7 (L) 04/10/2021   MCV 94.3 04/10/2021   PLT 203.0 04/10/2021   Lab Results  Component Value Date   NA 137 04/10/2021   K 4.3 04/10/2021   CO2 26 04/10/2021   GLUCOSE 287 (H) 04/10/2021   BUN 13 04/10/2021   CREATININE 0.88 04/10/2021   BILITOT 1.0 04/10/2021   ALKPHOS 58 04/10/2021   AST 24 04/10/2021   ALT 27 04/10/2021   PROT 7.4 04/10/2021   ALBUMIN 4.8 04/10/2021   CALCIUM 9.7 04/10/2021  GFR 96.14 04/10/2021   Lab Results  Component Value Date   CHOL 155 04/10/2021   Lab Results  Component Value Date   HDL 38.60 (L) 04/10/2021   Lab Results  Component Value Date   LDLCALC 83 04/10/2021   Lab Results  Component Value Date   TRIG 170.0 (H) 04/10/2021   Lab Results  Component Value Date   CHOLHDL 4 04/10/2021   Lab Results  Component Value Date   HGBA1C 9.2 (H) 04/10/2021       Assessment & Plan:   Problem List Items Addressed This  Visit       Unprioritized   COVID-19 - Primary   Relevant Medications   nirmatrelvir/ritonavir EUA (PAXLOVID) TABS    Meds ordered this encounter  Medications   nirmatrelvir/ritonavir EUA (PAXLOVID) TABS    Sig: Take 3 tablets by mouth 2 (two) times daily for 5 days. (Take nirmatrelvir 150 mg two tablets twice daily for 5 days and ritonavir 100 mg one tablet twice daily for 5 days) Patient GFR is 96    Dispense:  30 tablet    Refill:  0    I discussed the assessment and treatment plan with the patient. The patient was provided an opportunity to ask questions and all were answered. The patient agreed with the plan and demonstrated an understanding of the instructions.   The patient was advised to call back or seek an in-person evaluation if the symptoms worsen or if the condition fails to improve as anticipated.    Ann Held, DO [place] [facility] [phone] (phone) [phone] (fax)  [facility]

## 2021-05-05 NOTE — Assessment & Plan Note (Signed)
paxlovid sent in Can use mucinex / delsym for cough prn D/w pt quarantine for 5 days then they can leave house with mask as long as he is feeling better Call with any questions / concerns

## 2021-05-18 ENCOUNTER — Other Ambulatory Visit: Payer: Self-pay | Admitting: Family Medicine

## 2021-06-12 ENCOUNTER — Other Ambulatory Visit: Payer: Self-pay | Admitting: Family Medicine

## 2021-06-12 DIAGNOSIS — E119 Type 2 diabetes mellitus without complications: Secondary | ICD-10-CM

## 2021-06-12 DIAGNOSIS — I10 Essential (primary) hypertension: Secondary | ICD-10-CM

## 2021-06-12 DIAGNOSIS — E785 Hyperlipidemia, unspecified: Secondary | ICD-10-CM

## 2021-06-13 ENCOUNTER — Encounter: Payer: Self-pay | Admitting: Family Medicine

## 2021-06-15 ENCOUNTER — Other Ambulatory Visit: Payer: Self-pay | Admitting: Family Medicine

## 2021-07-11 ENCOUNTER — Ambulatory Visit: Payer: 59 | Admitting: Family Medicine

## 2021-07-12 ENCOUNTER — Other Ambulatory Visit: Payer: Self-pay | Admitting: Family Medicine

## 2021-07-12 DIAGNOSIS — E785 Hyperlipidemia, unspecified: Secondary | ICD-10-CM

## 2021-07-12 DIAGNOSIS — I10 Essential (primary) hypertension: Secondary | ICD-10-CM

## 2021-07-12 DIAGNOSIS — E7849 Other hyperlipidemia: Secondary | ICD-10-CM

## 2021-07-24 ENCOUNTER — Other Ambulatory Visit: Payer: Self-pay | Admitting: Family Medicine

## 2021-08-06 ENCOUNTER — Other Ambulatory Visit: Payer: Self-pay | Admitting: Family Medicine

## 2021-08-06 DIAGNOSIS — I10 Essential (primary) hypertension: Secondary | ICD-10-CM

## 2021-08-06 DIAGNOSIS — E785 Hyperlipidemia, unspecified: Secondary | ICD-10-CM

## 2021-08-06 DIAGNOSIS — E119 Type 2 diabetes mellitus without complications: Secondary | ICD-10-CM

## 2021-08-07 ENCOUNTER — Other Ambulatory Visit: Payer: Self-pay

## 2021-08-07 ENCOUNTER — Ambulatory Visit (INDEPENDENT_AMBULATORY_CARE_PROVIDER_SITE_OTHER): Payer: 59 | Admitting: Family Medicine

## 2021-08-07 ENCOUNTER — Encounter: Payer: Self-pay | Admitting: Family Medicine

## 2021-08-07 VITALS — BP 132/74 | HR 96 | Temp 97.8°F | Resp 16 | Wt 174.0 lb

## 2021-08-07 DIAGNOSIS — Z23 Encounter for immunization: Secondary | ICD-10-CM

## 2021-08-07 DIAGNOSIS — E1165 Type 2 diabetes mellitus with hyperglycemia: Secondary | ICD-10-CM

## 2021-08-07 DIAGNOSIS — K5180 Other ulcerative colitis without complications: Secondary | ICD-10-CM

## 2021-08-07 DIAGNOSIS — D649 Anemia, unspecified: Secondary | ICD-10-CM | POA: Diagnosis not present

## 2021-08-07 DIAGNOSIS — E782 Mixed hyperlipidemia: Secondary | ICD-10-CM | POA: Diagnosis not present

## 2021-08-07 DIAGNOSIS — I10 Essential (primary) hypertension: Secondary | ICD-10-CM | POA: Diagnosis not present

## 2021-08-07 LAB — LIPID PANEL
Cholesterol: 106 mg/dL (ref 0–200)
HDL: 37.5 mg/dL — ABNORMAL LOW (ref >39.00)
LDL Cholesterol: 55 mg/dL (ref 0–99)
NonHDL: 68.58
Total CHOL/HDL Ratio: 3
Triglycerides: 66 mg/dL (ref 0.0–149.0)
VLDL: 13.2 mg/dL (ref 0.0–40.0)

## 2021-08-07 LAB — COMPREHENSIVE METABOLIC PANEL
ALT: 25 U/L (ref 0–53)
AST: 23 U/L (ref 0–37)
Albumin: 4.6 g/dL (ref 3.5–5.2)
Alkaline Phosphatase: 59 U/L (ref 39–117)
BUN: 13 mg/dL (ref 6–23)
CO2: 25 mEq/L (ref 19–32)
Calcium: 9.7 mg/dL (ref 8.4–10.5)
Chloride: 104 mEq/L (ref 96–112)
Creatinine, Ser: 0.82 mg/dL (ref 0.40–1.50)
GFR: 97.99 mL/min (ref >60.00)
Glucose, Bld: 229 mg/dL — ABNORMAL HIGH (ref 70–99)
Potassium: 4.1 mEq/L (ref 3.5–5.1)
Sodium: 139 mEq/L (ref 135–145)
Total Bilirubin: 1 mg/dL (ref 0.2–1.2)
Total Protein: 7.1 g/dL (ref 6.0–8.3)

## 2021-08-07 LAB — CBC WITH DIFFERENTIAL/PLATELET
Basophils Absolute: 0 10*3/uL (ref 0.0–0.1)
Basophils Relative: 0.5 % (ref 0.0–3.0)
Eosinophils Absolute: 0.1 10*3/uL (ref 0.0–0.7)
Eosinophils Relative: 1.2 % (ref 0.0–5.0)
HCT: 36.5 % — ABNORMAL LOW (ref 39.0–52.0)
Hemoglobin: 12.9 g/dL — ABNORMAL LOW (ref 13.0–17.0)
Lymphocytes Relative: 18.8 % (ref 12.0–46.0)
Lymphs Abs: 0.8 10*3/uL (ref 0.7–4.0)
MCHC: 35.4 g/dL (ref 30.0–36.0)
MCV: 95 fl (ref 78.0–100.0)
Monocytes Absolute: 0.3 10*3/uL (ref 0.1–1.0)
Monocytes Relative: 6.3 % (ref 3.0–12.0)
Neutro Abs: 3.2 10*3/uL (ref 1.4–7.7)
Neutrophils Relative %: 73.2 % (ref 43.0–77.0)
Platelets: 209 10*3/uL (ref 150.0–400.0)
RBC: 3.84 Mil/uL — ABNORMAL LOW (ref 4.22–5.81)
RDW: 15.3 % (ref 11.5–15.5)
WBC: 4.4 10*3/uL (ref 4.0–10.5)

## 2021-08-07 LAB — TSH: TSH: 1.86 u[IU]/mL (ref 0.35–5.50)

## 2021-08-07 LAB — HEMOGLOBIN A1C: Hgb A1c MFr Bld: 8.9 % — ABNORMAL HIGH (ref 4.6–6.5)

## 2021-08-07 MED ORDER — TIRZEPATIDE 2.5 MG/0.5ML ~~LOC~~ SOAJ: 2.5000 mg | SUBCUTANEOUS | 1 refills | Status: DC

## 2021-08-07 NOTE — Assessment & Plan Note (Signed)
hgba1c acceptable, minimize simple carbs. Increase exercise as tolerated. Continue current meds 

## 2021-08-07 NOTE — Assessment & Plan Note (Signed)
Encourage heart healthy diet such as MIND or DASH diet, increase exercise, avoid trans fats, simple carbohydrates and processed foods, consider a krill or fish or flaxseed oil cap daily.  °

## 2021-08-07 NOTE — Progress Notes (Signed)
Subjective:   By signing my name below, I, Curtis Kennedy, attest that this documentation has been prepared under the direction and in the presence of Mosie Lukes, MD. 08/07/2021    Patient ID: Curtis Kennedy, male    DOB: 01/26/1966, 55 y.o.   MRN: [id number]  Chief Complaint  Patient presents with   Follow-up    HPI Patient is in today for an office visit and diabetic follow-up.  He reports he is doing well. He was recently in [place] during [place].   He received the flu vaccine today. He has 2 Pfizer Covid-19 vaccines and recently has Covid-19 3 months ago.  He mentions walking about 10000 steps a day. He has not been managing a healthy diet and does not check his blood sugar levels at home.  He has been managing his cholesterol levels with 5 mg Crestor and has side effects of fatigue. He also mentions he has a hard time falling asleep and doesn't fall asleep till 12 or 1 and has to wake up at 6 or 7. He uses OTC supplements sometimes.    Past Medical History:  Diagnosis Date   Anemia 07/02/2017   Colitis, ulcerative ([place]) Dx'd 2001   Dr. Erlene Quan; [facility]--has been in remission since about 2009   Diabetes mellitus without complication ([place]) 55 yrs old   type 2 (dx'd after long courses of prednisone for his UC)   Erectile dysfunction    Family history of tinea corporis 07/25/2012   Hyperlipidemia    Hypertension    Insomnia    Low back pain 08/07/2016   Preventative health care 12/31/2016   Right shoulder pain 12/31/2016   Sinusitis 08/28/2015    Past Surgical History:  Procedure Laterality Date   TRIGGER FINGER RELEASE  2011   b/l middle fingers    Family History  Problem Relation Age of Onset   Diabetes Father    Hyperlipidemia Father    Hypertension Father    Kidney disease Father        Kidney Failure   Heart disease Father 30       MI   Hypertension Sister    Hypertension Brother    Diabetes Paternal Grandfather    Hypertension Brother     Hyperlipidemia Brother    Heart disease Brother        Valve Surgery ?   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     Lives in [place], grew up in [place]--relocated to [facility] 2007.   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         Social Determinants of Health  Financial Resource Strain: Not on file  Food Insecurity: Not on file  Transportation Needs: Not on file  Physical Activity: Not on file  Stress: Not on file  Social Connections: Not on file  Intimate Partner Violence: Not on file    Outpatient Medications Prior to Visit  Medication Sig Dispense Refill   albuterol (PROVENTIL HFA;VENTOLIN HFA) 108 (90 Base) MCG/ACT inhaler Inhale 2 puffs into the lungs every 6 (six) hours as needed for wheezing or shortness of breath. 1 Inhaler 0   amitriptyline (ELAVIL) 10 MG tablet TAKE 1/2 TO 2 TABLETS(5 TO 20 MG) BY MOUTH AT BEDTIME AS NEEDED FOR SLEEP 40 tablet 1   balsalazide (COLAZAL) 750 MG capsule TAKE 3 CAPSULES(2250 MG) BY MOUTH THREE TIMES DAILY 270 capsule 3   Continuous Blood Gluc Receiver (DEXCOM G6 RECEIVER) DEVI Use for continuous blood glucose monitoring 1 each 0   Continuous Blood Gluc Sensor (DEXCOM G6 SENSOR) MISC Use for continuous blood glucose monitoring.  Replace every 10 days. (Patient taking differently: Use for  continuous blood glucose monitoring.  Replace every 10 days.) 4 each 0   Continuous Blood Gluc Transmit (DEXCOM G6 TRANSMITTER) MISC Use for continuous blood glucose monitoring.  Replace every 90 days (Patient taking differently: Use for continuous blood glucose monitoring.  Replace every 90 days) 1 each 1   fenofibrate 160 MG tablet TAKE 1 TABLET(160 MG) BY MOUTH DAILY 90 tablet 1   fluticasone (FLONASE) 50 MCG/ACT nasal spray Place 2 sprays into both nostrils daily. 16 g 5   fluticasone (FLOVENT HFA) 110 MCG/ACT inhaler Inhale 2 puffs into the lungs 2 (two) times daily. 1 Inhaler 2   HYDROcodone-homatropine (HYCODAN) 5-1.5 MG/5ML syrup Take 5 mLs by mouth every 6 (six) hours as needed for cough. 150 mL 0   Insulin Pen Needle (BD PEN NEEDLE NANO U/F) 32G X 4 MM MISC USE 1 NEEDLE DAILY AS DIRECTED 100 each 11   levocetirizine (XYZAL) 5 MG tablet Take 1 tablet (5 mg total) by mouth every evening. 90 tablet 3   losartan (COZAAR) 100 MG tablet TAKE 1 TABLET(100 MG) BY MOUTH DAILY 90 tablet 1   meloxicam (MOBIC) 15 MG tablet Take 15 mg by mouth daily.     metFORMIN (GLUCOPHAGE) 1000 MG tablet Take 1 tablet (1,000 mg total) by mouth 2 (two) times daily with a meal. 180 tablet 1   methocarbamol (ROBAXIN) 500 MG tablet Take 1 tablet (500 mg total) by mouth every 6 (six) hours as needed for muscle spasms. 60 tablet 2   olopatadine (PATANOL) 0.1 % ophthalmic solution Place 1 drop into both eyes 2 (two) times daily. 5 mL 3   rosuvastatin (CRESTOR) 5 MG tablet Take 1 tablet (5 mg total) by mouth daily. 90 tablet 1   sildenafil (VIAGRA) 100 MG tablet Take 0.5-1 tablets (50-100 mg total) by mouth daily as needed for erectile dysfunction. 90 tablet 2   traMADol (ULTRAM) 50 MG tablet Take 1 tablet (50 mg total) by mouth every 8 (eight) hours as needed. 60 tablet 0   valACYclovir (VALTREX) 1000 MG tablet Take 1 tablet (1,000 mg total) by mouth 2 (two) times daily as needed. 60 tablet 3   zaleplon (SONATA) 10 MG  capsule Take 1 capsule (10 mg total) by mouth at bedtime as needed for sleep. 30 capsule 2   azaTHIOprine (IMURAN) 50 MG tablet TAKE 4.5 TABLETS BY MOUTH EVERY DAY 405 tablet 1   Dulaglutide (TRULICITY) 1.5 FF/6.3WG SOPN Inject 1.5 mg into the  skin once a week. 12 mL 1   Lancets (ONETOUCH DELICA PLUS [id number]) MISC USE AS DIRECTED TWICE DAILY TO CHECK BLOOD SUGAR 100 each 0   LEVEMIR FLEXTOUCH 100 UNIT/ML FlexTouch Pen ADMINISTER 50 UNITS UNDER THE SKIN EVERY MORNING 45 mL 1   No facility-administered medications prior to visit.    Allergies  Allergen Reactions   Simvastatin     Myalgias    Review of Systems  Constitutional:  Positive for malaise/fatigue. Negative for fever.  HENT:  Negative for congestion.   Eyes:  Negative for redness.  Respiratory:  Negative for shortness of breath.   Cardiovascular:  Negative for chest pain, palpitations and leg swelling.  Gastrointestinal:  Negative for abdominal pain, blood in stool and nausea.  Genitourinary:  Negative for dysuria and frequency.  Musculoskeletal:  Negative for falls.  Skin:  Negative for rash.  Neurological:  Negative for dizziness, loss of consciousness and headaches.  Endo/Heme/Allergies:  Negative for polydipsia.  Psychiatric/Behavioral:  Negative for depression. The patient is not nervous/anxious.       Objective:    Physical Exam Constitutional:      Appearance: Normal appearance. He is not ill-appearing.  HENT:     Head: Normocephalic and atraumatic.     Right Ear: Tympanic membrane, ear canal and external ear normal.     Left Ear: Tympanic membrane, ear canal and external ear normal.  Eyes:     Conjunctiva/sclera: Conjunctivae normal.  Cardiovascular:     Rate and Rhythm: Normal rate and regular rhythm.     Heart sounds: Normal heart sounds. No murmur heard. Pulmonary:     Breath sounds: Normal breath sounds. No wheezing.  Abdominal:     General: Bowel sounds are normal. There is no distension.      Palpations: Abdomen is soft.     Tenderness: There is no abdominal tenderness.     Hernia: No hernia is present.  Musculoskeletal:     Cervical back: Neck supple.  Lymphadenopathy:     Cervical: No cervical adenopathy.  Skin:    General: Skin is warm and dry.  Neurological:     Mental Status: He is alert and oriented to person, place, and time.  Psychiatric:        Behavior: Behavior normal.    BP 132/74   Pulse 96   Temp 97.8 F (36.6 C)   Resp 16   Wt 174 lb (78.9 kg)   SpO2 99%   BMI 28.08 kg/m  Wt Readings from Last 3 Encounters:  08/07/21 174 lb (78.9 kg)  04/10/21 175 lb 3.2 oz (79.5 kg)  11/29/20 177 lb 3.2 oz (80.4 kg)    Diabetic Foot Exam - Simple   No data filed    Lab Results  Component Value Date   WBC 4.4 08/07/2021   HGB 12.9 (L) 08/07/2021   HCT 36.5 (L) 08/07/2021   PLT 209.0 08/07/2021   GLUCOSE 229 (H) 08/07/2021   CHOL 106 08/07/2021   TRIG 66.0 08/07/2021   HDL 37.50 (L) 08/07/2021   LDLCALC 55 08/07/2021   ALT 25 08/07/2021   AST 23 08/07/2021   NA 139 08/07/2021   K 4.1 08/07/2021   CL 104 08/07/2021   CREATININE 0.82 08/07/2021   BUN 13 08/07/2021   CO2 25 08/07/2021   TSH 1.86 08/07/2021   PSA 0.81 04/10/2021   HGBA1C 8.9 (H) 08/07/2021   MICROALBUR 7.3 (H) 11/24/2015    Lab Results  Component Value Date  TSH 1.86 08/07/2021   Lab Results  Component Value Date   WBC 4.4 08/07/2021   HGB 12.9 (L) 08/07/2021   HCT 36.5 (L) 08/07/2021   MCV 95.0 08/07/2021   PLT 209.0 08/07/2021   Lab Results  Component Value Date   NA 139 08/07/2021   K 4.1 08/07/2021   CO2 25 08/07/2021   GLUCOSE 229 (H) 08/07/2021   BUN 13 08/07/2021   CREATININE 0.82 08/07/2021   BILITOT 1.0 08/07/2021   ALKPHOS 59 08/07/2021   AST 23 08/07/2021   ALT 25 08/07/2021   PROT 7.1 08/07/2021   ALBUMIN 4.6 08/07/2021   CALCIUM 9.7 08/07/2021   GFR 97.99 08/07/2021   Lab Results  Component Value Date   CHOL 106 08/07/2021   Lab Results   Component Value Date   HDL 37.50 (L) 08/07/2021   Lab Results  Component Value Date   LDLCALC 55 08/07/2021   Lab Results  Component Value Date   TRIG 66.0 08/07/2021   Lab Results  Component Value Date   CHOLHDL 3 08/07/2021   Lab Results  Component Value Date   HGBA1C 8.9 (H) 08/07/2021       Assessment & Plan:   Problem List Items Addressed This Visit     Hypertension    Well controlled, no changes to meds. Encouraged heart healthy diet such as the DASH diet and exercise as tolerated.       Relevant Orders   CBC with Differential/Platelet (Completed)   Comprehensive metabolic panel (Completed)   Lipid panel (Completed)   TSH (Completed)   CBC   Comprehensive metabolic panel   TSH   Hyperlipidemia    Encourage heart healthy diet such as MIND or DASH diet, increase exercise, avoid trans fats, simple carbohydrates and processed foods, consider a krill or fish or flaxseed oil cap daily.       Relevant Orders   CBC with Differential/Platelet (Completed)   Comprehensive metabolic panel (Completed)   Lipid panel (Completed)   TSH (Completed)   Lipid panel   Colitis, ulcerative (HCC)    No recent exacerbation. Doing well.       Type 2 diabetes mellitus with hyperglycemia ([facility]) - Primary    hgba1c acceptable, minimize simple carbs. Increase exercise as tolerated. Continue current meds      Relevant Medications   tirzepatide (MOUNJARO) 2.5 MG/0.5ML Pen   Other Relevant Orders   Hemoglobin A1c (Completed)   Hemoglobin A1c   Anemia   Relevant Orders   CBC with Differential/Platelet (Completed)   Comprehensive metabolic panel (Completed)   Lipid panel (Completed)   TSH (Completed)    Meds ordered this encounter  Medications   tirzepatide (MOUNJARO) 2.5 MG/0.5ML Pen    Sig: Inject 2.5 mg into the skin once a week.    Dispense:  2 mL    Refill:  1    I,Curtis Kennedy,acting as a scribe for Penni Homans, MD.,have documented all relevant documentation on  the behalf of Penni Homans, MD,as directed by  Penni Homans, MD while in the presence of Penni Homans, MD.   I, Mosie Lukes, MD. , personally preformed the services described in this documentation.  All medical record entries made by the scribe were at my direction and in my presence.  I have reviewed the chart and discharge instructions (if applicable) and agree that the record reflects my personal performance and is accurate and complete. 08/07/2021

## 2021-08-07 NOTE — Assessment & Plan Note (Signed)
No recent exacerbation. Doing well.

## 2021-08-07 NOTE — Assessment & Plan Note (Signed)
Well controlled, no changes to meds. Encouraged heart healthy diet such as the DASH diet and exercise as tolerated.  °

## 2021-08-07 NOTE — Patient Instructions (Signed)

## 2021-08-08 ENCOUNTER — Encounter: Payer: Self-pay | Admitting: Family Medicine

## 2021-08-09 ENCOUNTER — Telehealth: Payer: Self-pay

## 2021-08-09 NOTE — Telephone Encounter (Addendum)
Prior Auth started [employer] 2.25m/0.5ml Key: [facility] Author[facility] effective 08/17/21-08/16/22

## 2021-08-15 NOTE — Telephone Encounter (Signed)
Hello Curtis Kennedy,   I called and gave them the information hopefully we will get an answer soon.

## 2021-08-21 ENCOUNTER — Encounter: Payer: Self-pay | Admitting: Family Medicine

## 2021-08-25 ENCOUNTER — Other Ambulatory Visit: Payer: Self-pay

## 2021-08-25 MED ORDER — DEXCOM G6 SENSOR MISC: 0 refills | Status: DC

## 2021-08-25 MED ORDER — DEXCOM G6 RECEIVER DEVI: 0 refills | Status: AC

## 2021-08-25 MED ORDER — DEXCOM G6 TRANSMITTER MISC: 1 refills | Status: DC

## 2021-08-25 NOTE — Telephone Encounter (Signed)
reordered

## 2021-09-27 ENCOUNTER — Other Ambulatory Visit: Payer: Self-pay | Admitting: Family Medicine

## 2021-09-27 DIAGNOSIS — E785 Hyperlipidemia, unspecified: Secondary | ICD-10-CM

## 2021-09-27 DIAGNOSIS — E119 Type 2 diabetes mellitus without complications: Secondary | ICD-10-CM

## 2021-09-27 DIAGNOSIS — I10 Essential (primary) hypertension: Secondary | ICD-10-CM

## 2021-10-04 ENCOUNTER — Encounter: Payer: Self-pay | Admitting: Family Medicine

## 2021-10-10 ENCOUNTER — Other Ambulatory Visit: Payer: Self-pay | Admitting: Family Medicine

## 2021-10-10 DIAGNOSIS — I10 Essential (primary) hypertension: Secondary | ICD-10-CM

## 2021-10-10 DIAGNOSIS — E785 Hyperlipidemia, unspecified: Secondary | ICD-10-CM

## 2021-10-10 DIAGNOSIS — E7849 Other hyperlipidemia: Secondary | ICD-10-CM

## 2021-10-11 NOTE — Telephone Encounter (Signed)
Paperwork placed in yellow bin

## 2021-10-20 ENCOUNTER — Other Ambulatory Visit: Payer: Self-pay | Admitting: Family Medicine

## 2021-10-24 ENCOUNTER — Other Ambulatory Visit: Payer: Self-pay | Admitting: Family Medicine

## 2021-10-26 ENCOUNTER — Telehealth: Payer: Self-pay

## 2021-10-26 NOTE — Telephone Encounter (Signed)
Levemir no longer covered by insurance. See list below for alternatives:   HumaLOG KwikPen Not Required HumaLOG Mix 50/50 KwikPen Not Required HumaLOG Mix 75/25 KwikPen Not Required Insulin Degludec Not Required Insulin Degludec FlexTouch Not Required Semglee (yfgn) Not Required Willeen Niece Not Required Tyler Aas FlexTouch Not Required Xultophy Not Required

## 2021-10-27 ENCOUNTER — Other Ambulatory Visit: Payer: Self-pay

## 2021-10-27 ENCOUNTER — Encounter: Payer: Self-pay | Admitting: Family Medicine

## 2021-10-27 MED ORDER — INSULIN GLARGINE-YFGN 100 UNIT/ML ~~LOC~~ SOPN: PEN_INJECTOR | SUBCUTANEOUS | 3 refills | Status: DC

## 2021-10-27 NOTE — Telephone Encounter (Signed)
Lvm to let pt know we will be switching medication and he should be able to pick up from pharmacy

## 2021-10-30 ENCOUNTER — Other Ambulatory Visit: Payer: Self-pay

## 2021-10-30 MED ORDER — TRULICITY 3 MG/0.5ML ~~LOC~~ SOAJ: 3.0000 mg | SUBCUTANEOUS | 1 refills | Status: DC

## 2021-10-30 NOTE — Telephone Encounter (Signed)
Lvm to call back

## 2021-10-30 NOTE — Telephone Encounter (Signed)
Pt called back in states that he has no have an interruption and medication sent

## 2021-11-08 ENCOUNTER — Other Ambulatory Visit: Payer: 59

## 2021-11-14 ENCOUNTER — Encounter: Payer: 59 | Admitting: Family Medicine

## 2021-11-19 ENCOUNTER — Other Ambulatory Visit: Payer: Self-pay | Admitting: Family Medicine

## 2021-12-11 ENCOUNTER — Encounter: Payer: Self-pay | Admitting: Family Medicine

## 2021-12-11 DIAGNOSIS — E1165 Type 2 diabetes mellitus with hyperglycemia: Secondary | ICD-10-CM

## 2021-12-11 MED ORDER — BASAGLAR KWIKPEN 100 UNIT/ML ~~LOC~~ SOPN: 50.0000 [IU] | PEN_INJECTOR | Freq: Every day | SUBCUTANEOUS | 3 refills | Status: DC

## 2021-12-12 NOTE — Progress Notes (Signed)
? ? ?  Subjective:   ? ?CC: B hip pain ? ?I, Wendy Poet, LAT, ATC, am serving as scribe for Dr. Lynne Leader. ? ?HPI: Pt is a 56 y/o male c/o B hip pain x 1-2, worsening over the last few months.  He locates his pain to the lateral aspect of both hips. Pt notes he's noticed the pain worsening since he's been more active outside doing different activities. Pt has questions on Tylenol dosing and if he should be taking the tramadol. ? ?Radiating pain: no ?Low back pain: no ?Aggravating factors: increased activity ?Treatments tried: Tylenol ? ?Diagnostic testing: L-spine XR- 07/31/18, 09/13/17 ? ?Pertinent review of Systems: No fevers or chills ? ?Relevant historical information: Diabetes. ?Ulcerative colitis. ? ? ?Objective:   ? ?Vitals:  ? 12/13/21 1029  ?BP: 128/80  ?Pulse: 88  ?SpO2: 98%  ? ?General: Well Developed, well nourished, and in no acute distress.  ? ?MSK: Right hip: Normal appearing ?Normal motion. ?Tender palpation greater trochanter. ?Hip abduction and external rotation strength are reduced 4/5 with pain. ? ?Left hip: Normal appearing ?Normal motion. ?Tender palpation greater trochanter.   ?Hip abduction strength is reduced 4/5 with pain. ? ?Lab and Radiology Results ? ?X-ray images bilateral hips obtained today personally and independently interpreted. ? ?Right hip: Minimal DJD. ?Enthesiopathy changes at greater trochanter. ?No acute fractures ? ?Left hip: Minimal DJD. ?No acute fractures. ? ?Await formal radiology review ? ? ? ?Impression and Recommendations:   ? ?Assessment and Plan: ?56 y.o. male with bilateral hip pain thought to be due to hip abductor tendinopathy/greater trochanteric bursitis.  Plan to treat with home exercise program taught in clinic today by ATC.  If not improving he can let me know and I can refer to physical therapy.  His wife did very well with PT at [facility] so that would be a good choice.  He lives in [place] to [facility] could also be a  choice... ?Also talked about medication dosing and safety.  Recommend against oral NSAIDs.  Talked about maximum safe dose of Tylenol. ? ? ? ?Discussed warning signs or symptoms. Please see discharge instructions. Patient expresses understanding. ? ? ?The above documentation has been reviewed and is accurate and complete Lynne Leader, M.D. ? ?

## 2021-12-13 ENCOUNTER — Ambulatory Visit (INDEPENDENT_AMBULATORY_CARE_PROVIDER_SITE_OTHER): Payer: Managed Care, Other (non HMO)

## 2021-12-13 ENCOUNTER — Ambulatory Visit: Payer: Self-pay

## 2021-12-13 ENCOUNTER — Other Ambulatory Visit: Payer: Self-pay | Admitting: Family Medicine

## 2021-12-13 ENCOUNTER — Ambulatory Visit: Payer: Managed Care, Other (non HMO) | Admitting: Family Medicine

## 2021-12-13 ENCOUNTER — Other Ambulatory Visit: Payer: Self-pay

## 2021-12-13 VITALS — BP 128/80 | HR 88 | Ht 66.0 in | Wt 173.0 lb

## 2021-12-13 DIAGNOSIS — G8929 Other chronic pain: Secondary | ICD-10-CM

## 2021-12-13 DIAGNOSIS — M25551 Pain in right hip: Secondary | ICD-10-CM | POA: Diagnosis not present

## 2021-12-13 DIAGNOSIS — M7062 Trochanteric bursitis, left hip: Secondary | ICD-10-CM

## 2021-12-13 DIAGNOSIS — M25552 Pain in left hip: Secondary | ICD-10-CM | POA: Diagnosis not present

## 2021-12-13 DIAGNOSIS — M7061 Trochanteric bursitis, right hip: Secondary | ICD-10-CM | POA: Insufficient documentation

## 2021-12-13 NOTE — Patient Instructions (Addendum)
Thank you for coming in today.  ? ?Please get Xrays today before you leave  ? ?Please use Voltaren gel (Generic Diclofenac Gel) up to 4x daily for pain as needed.  This is available over-the-counter as both the name brand Voltaren gel and the generic diclofenac gel.  ? ?Please complete the exercises that the athletic trainer went over with you:  View at [email or web address]-[email or web address] using code: [id number] ? ?If not getting better,  please let me know and we can order physical therapy to [facility] ? ? ?

## 2021-12-15 NOTE — Progress Notes (Signed)
Bilateral hip x-ray shows no fracture or significant arthritis.

## 2021-12-19 ENCOUNTER — Telehealth: Payer: Managed Care, Other (non HMO) | Admitting: Nurse Practitioner

## 2021-12-19 DIAGNOSIS — J0110 Acute frontal sinusitis, unspecified: Secondary | ICD-10-CM | POA: Diagnosis not present

## 2021-12-19 MED ORDER — DOXYCYCLINE HYCLATE 100 MG PO TABS: 100.0000 mg | ORAL_TABLET | Freq: Two times a day (BID) | ORAL | 0 refills | Status: AC

## 2021-12-19 NOTE — Progress Notes (Signed)
?Virtual Visit Consent  ? ?Curtis Kennedy, you are scheduled for a virtual visit with a [facility] provider today.   ?  ?Just as with appointments in the office, your consent must be obtained to participate.  Your consent will be active for this visit and any virtual visit you may have with one of our providers in the next 365 days.   ?  ?If you have a MyChart account, a copy of this consent can be sent to you electronically.  All virtual visits are billed to your insurance company just like a traditional visit in the office.   ? ?As this is a virtual visit, video technology does not allow for your provider to perform a traditional examination.  This may limit your provider's ability to fully assess your condition.  If your provider identifies any concerns that need to be evaluated in person or the need to arrange testing (such as labs, EKG, etc.), we will make arrangements to do so.   ?  ?Although advances in technology are sophisticated, we cannot ensure that it will always work on either your end or our end.  If the connection with a video visit is poor, the visit may have to be switched to a telephone visit.  With either a video or telephone visit, we are not always able to ensure that we have a secure connection.    ? ?I need to obtain your verbal consent now.   Are you willing to proceed with your visit today?  ?  ?[name] has provided verbal consent on 12/19/2021 for a virtual visit (video or telephone). ?  ?Apolonio Schneiders, FNP  ? ?Date: 12/19/2021 9:56 AM ? ? ?Virtual Visit via Video Note  ? ?IApolonio Schneiders, connected with  Curtis Kennedy  ([id number], September 12, 1966) on 12/19/21 at 10:00 AM EST by a video-enabled telemedicine application and verified that I am speaking with the correct person using two identifiers. ? ?Location: ?Patient: Virtual Visit Location Patient: Home ?Provider: Virtual Visit Location Provider: Home Office ?  ?I discussed the limitations of evaluation and management by  telemedicine and the availability of in person appointments. The patient expressed understanding and agreed to proceed.   ? ?History of Present Illness: ?Curtis Kennedy is a 56 y.o. who identifies as a male who was assigned male at birth, and is being seen today with complaints of sinus congestion and a cough for the past 10+ days. He feels his symptoms have worsened in the past 48 hour. He has more chest congestion now and is having a hard time sleeping.  ? ?He continues to have nasal drainage and pressure in his ears.  ? ?He has been bothered by the change in temperature and does stay on allergy medications year round.  ? ?He has taken a home COVID test and that was negative  ?Problems:  ?Patient Active Problem List  ? Diagnosis Date Noted  ? Greater trochanteric bursitis of both hips 12/13/2021  ? Allergies 03/10/2020  ? Tinnitus 07/08/2018  ? Fatty liver disease, nonalcoholic 43/88/8757  ? Ganglion cyst 07/19/2017  ? Anemia 07/02/2017  ? Right shoulder pain 12/31/2016  ? Trigger finger of left hand 12/31/2016  ? Preventative health care 12/31/2016  ? Low back pain 08/07/2016  ? Encounter for diabetic foot exam ([place]) 07/17/2016  ? Type 2 diabetes mellitus with hyperglycemia ([place]) 08/31/2015  ? Axillary mass 04/28/2015  ? Insomnia   ? Tinea corporis 07/25/2012  ?  Erectile dysfunction   ? Hypertension   ? Hyperlipidemia   ? Colitis, ulcerative ([place])   ?  ?Allergies  ?Allergen Reactions  ? Penicillins Hives  ? Simvastatin   ?  Myalgias  ?  ? ?Medications:  ?Current Outpatient Medications:  ?  albuterol (PROVENTIL HFA;VENTOLIN HFA) 108 (90 Base) MCG/ACT inhaler, Inhale 2 puffs into the lungs every 6 (six) hours as needed for wheezing or shortness of breath., Disp: 1 Inhaler, Rfl: 0 ?  amitriptyline (ELAVIL) 10 MG tablet, TAKE 1/2 TO 2 TABLETS(5 TO 20 MG) BY MOUTH AT BEDTIME AS NEEDED FOR SLEEP, Disp: 40 tablet, Rfl: 1 ?  azaTHIOprine (IMURAN) 50 MG tablet, TAKE 4 AND 1/2 TABLETS BY MOUTH EVERY DAY, Disp: 405  tablet, Rfl: 1 ?  balsalazide (COLAZAL) 750 MG capsule, TAKE 3 CAPSULES(2250 MG) BY MOUTH THREE TIMES DAILY, Disp: 270 capsule, Rfl: 3 ?  Continuous Blood Gluc Receiver ([place]) DEVI, Use for continuous blood glucose monitoring, Disp: 1 each, Rfl: 0 ?  Continuous Blood Gluc Sensor (DEXCOM G6 SENSOR) MISC, Use for continuous blood glucose monitoring.  Replace every 10 days., Disp: 4 each, Rfl: 0 ?  Continuous Blood Gluc Transmit (DEXCOM G6 TRANSMITTER) MISC, Use for continuous blood glucose monitoring.  Replace every 90 days, Disp: 1 each, Rfl: 1 ?  Dulaglutide (TRULICITY) 3 DG/6.4QI SOPN, Inject 3 mg as directed once a week., Disp: 6 mL, Rfl: 1 ?  fenofibrate 160 MG tablet, TAKE 1 TABLET(160 MG) BY MOUTH DAILY, Disp: 90 tablet, Rfl: 1 ?  fluticasone (FLONASE) 50 MCG/ACT nasal spray, Place 2 sprays into both nostrils daily., Disp: 16 g, Rfl: 5 ?  fluticasone (FLOVENT HFA) 110 MCG/ACT inhaler, Inhale 2 puffs into the lungs 2 (two) times daily., Disp: 1 Inhaler, Rfl: 2 ?  HYDROcodone-homatropine (HYCODAN) 5-1.5 MG/5ML syrup, Take 5 mLs by mouth every 6 (six) hours as needed for cough. (Patient not taking: Reported on 12/13/2021), Disp: 150 mL, Rfl: 0 ?  Insulin Glargine (BASAGLAR KWIKPEN) 100 UNIT/ML, Inject 50 Units into the skin daily., Disp: 15 mL, Rfl: 3 ?  Insulin Pen Needle (BD PEN NEEDLE NANO U/F) 32G X 4 MM MISC, USE 1 NEEDLE DAILY AS DIRECTED, Disp: 100 each, Rfl: 11 ?  Lancets (ONETOUCH DELICA PLUS [id number]) MISC, USE AS DIRECTED TWICE DAILY TO CHECK BLOOD SUGAR, Disp: 100 each, Rfl: 0 ?  levocetirizine (XYZAL) 5 MG tablet, Take 1 tablet (5 mg total) by mouth every evening., Disp: 90 tablet, Rfl: 3 ?  losartan (COZAAR) 100 MG tablet, TAKE 1 TABLET(100 MG) BY MOUTH DAILY, Disp: 90 tablet, Rfl: 1 ?  meloxicam (MOBIC) 15 MG tablet, Take 15 mg by mouth daily., Disp: , Rfl:  ?  metFORMIN (GLUCOPHAGE) 1000 MG tablet, TAKE 1 TABLET(1000 MG) BY MOUTH TWICE DAILY WITH A MEAL, Disp: 180 tablet, Rfl: 1 ?   methocarbamol (ROBAXIN) 500 MG tablet, Take 1 tablet (500 mg total) by mouth every 6 (six) hours as needed for muscle spasms., Disp: 60 tablet, Rfl: 2 ?  olopatadine (PATANOL) 0.1 % ophthalmic solution, Place 1 drop into both eyes 2 (two) times daily., Disp: 5 mL, Rfl: 3 ?  rosuvastatin (CRESTOR) 5 MG tablet, TAKE 1 TABLET(5 MG) BY MOUTH DAILY, Disp: 90 tablet, Rfl: 1 ?  sildenafil (VIAGRA) 100 MG tablet, Take 0.5-1 tablets (50-100 mg total) by mouth daily as needed for erectile dysfunction., Disp: 90 tablet, Rfl: 2 ?  traMADol (ULTRAM) 50 MG tablet, Take 1 tablet (50 mg total) by mouth every 8 (  eight) hours as needed., Disp: 60 tablet, Rfl: 0 ?  valACYclovir (VALTREX) 1000 MG tablet, Take 1 tablet (1,000 mg total) by mouth 2 (two) times daily as needed., Disp: 60 tablet, Rfl: 3 ?  zaleplon (SONATA) 10 MG capsule, Take 1 capsule (10 mg total) by mouth at bedtime as needed for sleep., Disp: 30 capsule, Rfl: 2 ? ?Observations/Objective: ?Patient is well-developed, well-nourished in no acute distress.  ?Resting comfortably at home.  ?Head is normocephalic, atraumatic.  ?No labored breathing.  ?Speech is clear and coherent with logical content.  ?Patient is alert and oriented at baseline.  ? ? ?Assessment and Plan: ?1. Acute non-recurrent frontal sinusitis ? ?- doxycycline (VIBRA-TABS) 100 MG tablet; Take 1 tablet (100 mg total) by mouth 2 (two) times daily for 7 days.  Dispense: 14 tablet; Refill: 0 ?   ?Use Mucinex as needed for congestion and cough  ?Advil/tylenol a needed for headache  ?  ?Follow Up Instructions: ?I discussed the assessment and treatment plan with the patient. The patient was provided an opportunity to ask questions and all were answered. The patient agreed with the plan and demonstrated an understanding of the instructions.  A copy of instructions were sent to the patient via MyChart unless otherwise noted below.  ? ? ?The patient was advised to call back or seek an in-person evaluation if the  symptoms worsen or if the condition fails to improve as anticipated. ? ?Time:  ?I spent 10 minutes with the patient via telehealth technology discussing the above problems/concerns.   ? ?Apolonio Schneiders, FNP  ?

## 2022-01-11 ENCOUNTER — Other Ambulatory Visit: Payer: Self-pay | Admitting: Family Medicine

## 2022-02-02 ENCOUNTER — Other Ambulatory Visit: Payer: Self-pay | Admitting: Family Medicine

## 2022-02-02 DIAGNOSIS — I10 Essential (primary) hypertension: Secondary | ICD-10-CM

## 2022-02-02 DIAGNOSIS — E785 Hyperlipidemia, unspecified: Secondary | ICD-10-CM

## 2022-02-02 DIAGNOSIS — E119 Type 2 diabetes mellitus without complications: Secondary | ICD-10-CM

## 2022-02-08 ENCOUNTER — Other Ambulatory Visit: Payer: Self-pay | Admitting: Family Medicine

## 2022-03-13 LAB — HM DIABETES EYE EXAM

## 2022-03-15 ENCOUNTER — Encounter: Payer: Self-pay | Admitting: Family Medicine

## 2022-03-15 MED ORDER — OLOPATADINE HCL 0.1 % OP SOLN
1.0000 [drp] | Freq: Two times a day (BID) | OPHTHALMIC | 3 refills | Status: AC
Start: 2022-03-15 — End: ?

## 2022-03-20 ENCOUNTER — Encounter: Payer: Self-pay | Admitting: Family Medicine

## 2022-03-20 DIAGNOSIS — R5383 Other fatigue: Secondary | ICD-10-CM

## 2022-03-20 DIAGNOSIS — I10 Essential (primary) hypertension: Secondary | ICD-10-CM

## 2022-03-20 DIAGNOSIS — E1165 Type 2 diabetes mellitus with hyperglycemia: Secondary | ICD-10-CM

## 2022-03-20 DIAGNOSIS — E782 Mixed hyperlipidemia: Secondary | ICD-10-CM

## 2022-03-28 ENCOUNTER — Other Ambulatory Visit: Payer: Self-pay

## 2022-03-28 DIAGNOSIS — E119 Type 2 diabetes mellitus without complications: Secondary | ICD-10-CM

## 2022-03-28 DIAGNOSIS — I10 Essential (primary) hypertension: Secondary | ICD-10-CM

## 2022-03-28 DIAGNOSIS — E785 Hyperlipidemia, unspecified: Secondary | ICD-10-CM

## 2022-03-28 MED ORDER — METFORMIN HCL 1000 MG PO TABS: ORAL_TABLET | ORAL | 1 refills | Status: DC

## 2022-03-29 ENCOUNTER — Other Ambulatory Visit (INDEPENDENT_AMBULATORY_CARE_PROVIDER_SITE_OTHER): Payer: Commercial Managed Care - HMO

## 2022-03-29 ENCOUNTER — Other Ambulatory Visit: Payer: Self-pay

## 2022-03-29 DIAGNOSIS — I10 Essential (primary) hypertension: Secondary | ICD-10-CM

## 2022-03-29 DIAGNOSIS — R5383 Other fatigue: Secondary | ICD-10-CM | POA: Diagnosis not present

## 2022-03-29 DIAGNOSIS — E1165 Type 2 diabetes mellitus with hyperglycemia: Secondary | ICD-10-CM | POA: Diagnosis not present

## 2022-03-29 DIAGNOSIS — E782 Mixed hyperlipidemia: Secondary | ICD-10-CM

## 2022-03-29 LAB — COMPREHENSIVE METABOLIC PANEL
ALT: 25 U/L (ref 0–53)
AST: 28 U/L (ref 0–37)
Albumin: 4.5 g/dL (ref 3.5–5.2)
Alkaline Phosphatase: 66 U/L (ref 39–117)
BUN: 11 mg/dL (ref 6–23)
CO2: 27 mEq/L (ref 19–32)
Calcium: 10 mg/dL (ref 8.4–10.5)
Chloride: 105 mEq/L (ref 96–112)
Creatinine, Ser: 0.8 mg/dL (ref 0.40–1.50)
GFR: 98.28 mL/min (ref >60.00)
Glucose, Bld: 198 mg/dL — ABNORMAL HIGH (ref 70–99)
Potassium: 4.3 mEq/L (ref 3.5–5.1)
Sodium: 140 mEq/L (ref 135–145)
Total Bilirubin: 0.8 mg/dL (ref 0.2–1.2)
Total Protein: 7.1 g/dL (ref 6.0–8.3)

## 2022-03-29 LAB — LIPID PANEL
Cholesterol: 120 mg/dL (ref 0–200)
HDL: 41.1 mg/dL (ref >39.00)
LDL Cholesterol: 60 mg/dL (ref 0–99)
NonHDL: 78.91
Total CHOL/HDL Ratio: 3
Triglycerides: 97 mg/dL (ref 0.0–149.0)
VLDL: 19.4 mg/dL (ref 0.0–40.0)

## 2022-03-29 LAB — CBC
HCT: 34.8 % — ABNORMAL LOW (ref 39.0–52.0)
Hemoglobin: 12.1 g/dL — ABNORMAL LOW (ref 13.0–17.0)
MCHC: 34.9 g/dL (ref 30.0–36.0)
MCV: 97.5 fl (ref 78.0–100.0)
Platelets: 190 10*3/uL (ref 150.0–400.0)
RBC: 3.57 Mil/uL — ABNORMAL LOW (ref 4.22–5.81)
RDW: 15.7 % — ABNORMAL HIGH (ref 11.5–15.5)
WBC: 3.8 10*3/uL — ABNORMAL LOW (ref 4.0–10.5)

## 2022-03-29 LAB — HEMOGLOBIN A1C: Hgb A1c MFr Bld: 8.1 % — ABNORMAL HIGH (ref 4.6–6.5)

## 2022-03-29 LAB — VITAMIN B12: Vitamin B-12: 414 pg/mL (ref 211–911)

## 2022-03-29 LAB — TSH: TSH: 1.24 u[IU]/mL (ref 0.35–5.50)

## 2022-04-03 ENCOUNTER — Other Ambulatory Visit: Payer: Self-pay | Admitting: Family Medicine

## 2022-04-03 DIAGNOSIS — E1165 Type 2 diabetes mellitus with hyperglycemia: Secondary | ICD-10-CM

## 2022-04-04 NOTE — Progress Notes (Signed)
Subjective:    Patient ID: Curtis Kennedy, male    DOB: 1966/10/12, 56 y.o.   MRN: [id number]  Chief Complaint  Patient presents with   Annual Exam    HPI Patient is in today for his annual physical exam and folllow up on chronic medical concerns. No recent febrile illness or acute hospitalizations. No complaints of polyuria or polydipsia. Is trying to maintain a heart healthy diet and stay active. He has had 2 episodes of feeling shaky and anxious but he did not check his sugar at that time. He has a lesion over his lip and had an appt with dermatology but then missed it. He now has an appt in November. Denies CP/palp/SOB/HA/congestion/fevers/GI or GU c/o. 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 Erlene Quan; [facility]--has been in remission since about 2009   Diabetes mellitus without complication ([place]) 56 yrs old   type 2 (dx'd after long courses of prednisone for his UC)   Erectile dysfunction    Family history of tinea corporis 07/25/2012   Gastroesophageal reflux disease 04/05/2022   Hyperlipidemia    Hypertension    Insomnia    Low back pain 08/07/2016   Preventative health care 12/31/2016   Right shoulder pain 12/31/2016   Sinusitis 08/28/2015    Past Surgical History:  Procedure Laterality Date   TRIGGER FINGER RELEASE  10/15/2009   b/l middle fingers    Family History  Problem Relation Age of Onset   Diabetes Father    Hyperlipidemia Father    Hypertension Father    Kidney disease Father        Kidney Failure   Heart disease Father 69       MI   Hypertension Sister    Hypertension Brother    Diabetes Paternal Grandfather    Hypertension Brother    Hyperlipidemia Brother    Heart disease Brother        Valve Surgery ?   Diabetes Brother    Hypertension Brother    ADD / ADHD Brother     Social History   Socioeconomic History   Marital status:  Married    Spouse name: Not on file   Number of children: Not on file   Years of education: Not on file   Highest education level: Not on file  Occupational History   Not on file  Tobacco Use   Smoking status: Never   Smokeless tobacco: Never  Substance and Sexual Activity   Alcohol use: Never   Drug use: Never   Sexual activity: Yes    Partners: Female    Birth control/protection: None    Comment: lives with wife, does office work follows with  Other Topics Concern   Not on file  Social History Narrative   Married, 58 y/o girl.     Lives in [place], grew up in [place]--relocated to [facility] 2007.   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         Social Determinants of Health   Financial Resource Strain: Not on file  Food Insecurity: Not on file  Transportation Needs: Not on file  Physical Activity: Not on file  Stress: Not on file  Social Connections: Not on file  Intimate Partner Violence: Not on file  Outpatient Medications Prior to Visit  Medication Sig Dispense Refill   albuterol (PROVENTIL HFA;VENTOLIN HFA) 108 (90 Base) MCG/ACT inhaler Inhale 2 puffs into the lungs every 6 (six) hours as needed for wheezing or shortness of breath. 1 Inhaler 0   amitriptyline (ELAVIL) 10 MG tablet TAKE 1/2 TO 2 TABLETS(5 TO 20 MG) BY MOUTH AT BEDTIME AS NEEDED FOR SLEEP 40 tablet 1   azaTHIOprine (IMURAN) 50 MG tablet TAKE 4.5 TABLETS BY MOUTH EVERY DAY 405 tablet 1   balsalazide (COLAZAL) 750 MG capsule TAKE 3 CAPSULES(2250 MG) BY MOUTH THREE TIMES DAILY 270 capsule 3   BD PEN NEEDLE NANO 2ND GEN 32G X 4 MM MISC USE 1 NEEDLE DAILY AS DIRECTED 100 each 11   Continuous Blood Gluc Receiver (DEXCOM G6 RECEIVER) DEVI Use for continuous blood glucose monitoring 1 each 0   Continuous Blood Gluc Sensor (DEXCOM G6 SENSOR) MISC Use for continuous blood glucose monitoring.  Replace every 10 days. 4 each 0   Continuous Blood  Gluc Transmit (DEXCOM G6 TRANSMITTER) MISC Use for continuous blood glucose monitoring.  Replace every 90 days 1 each 1   Dulaglutide (TRULICITY) 3 DB/5.2CE SOPN Inject 3 mg as directed once a week. 6 mL 1   fenofibrate 160 MG tablet TAKE 1 TABLET(160 MG) BY MOUTH DAILY 90 tablet 1   fluticasone (FLONASE) 50 MCG/ACT nasal spray Place 2 sprays into both nostrils daily. 16 g 5   fluticasone (FLOVENT HFA) 110 MCG/ACT inhaler Inhale 2 puffs into the lungs 2 (two) times daily. 1 Inhaler 2   Insulin Glargine (BASAGLAR KWIKPEN) 100 UNIT/ML ADMINISTER 50 UNITS UNDER THE SKIN DAILY 15 mL 3   Lancets (ONETOUCH DELICA PLUS [id number]) MISC USE AS DIRECTED TWICE DAILY TO CHECK BLOOD SUGAR 100 each 0   levocetirizine (XYZAL) 5 MG tablet TAKE 1 TABLET(5 MG) BY MOUTH EVERY EVENING 90 tablet 3   losartan (COZAAR) 100 MG tablet TAKE 1 TABLET(100 MG) BY MOUTH DAILY 90 tablet 1   meloxicam (MOBIC) 15 MG tablet Take 15 mg by mouth daily.     metFORMIN (GLUCOPHAGE) 1000 MG tablet TAKE 1 TABLET(1000 MG) BY MOUTH TWICE DAILY WITH A MEAL 180 tablet 1   methocarbamol (ROBAXIN) 500 MG tablet Take 1 tablet (500 mg total) by mouth every 6 (six) hours as needed for muscle spasms. 60 tablet 2   olopatadine (PATANOL) 0.1 % ophthalmic solution Place 1 drop into both eyes 2 (two) times daily. 5 mL 3   rosuvastatin (CRESTOR) 5 MG tablet TAKE 1 TABLET(5 MG) BY MOUTH DAILY 90 tablet 1   traMADol (ULTRAM) 50 MG tablet Take 1 tablet (50 mg total) by mouth every 8 (eight) hours as needed. 60 tablet 0   valACYclovir (VALTREX) 1000 MG tablet Take 1 tablet (1,000 mg total) by mouth 2 (two) times daily as needed. 60 tablet 3   zaleplon (SONATA) 10 MG capsule Take 1 capsule (10 mg total) by mouth at bedtime as needed for sleep. 30 capsule 2   HYDROcodone-homatropine (HYCODAN) 5-1.5 MG/5ML syrup Take 5 mLs by mouth every 6 (six) hours as needed for cough. (Patient not taking: Reported on 12/13/2021) 150 mL 0   sildenafil (VIAGRA) 100 MG tablet  Take 0.5-1 tablets (50-100 mg total) by mouth daily as needed for erectile dysfunction. 90 tablet 2   No facility-administered medications prior to visit.    Allergies  Allergen Reactions   Penicillins Hives   Simvastatin     Myalgias    Review of Systems  Constitutional:  Negative for chills, fever and malaise/fatigue.  HENT:  Negative for congestion and hearing loss.   Eyes:  Negative for discharge.  Respiratory:  Negative for cough, sputum production and shortness of breath.   Cardiovascular:  Negative for chest pain, palpitations and leg swelling.  Gastrointestinal:  Positive for abdominal pain and heartburn. Negative for blood in stool, constipation, diarrhea, nausea and vomiting.  Genitourinary:  Negative for dysuria, frequency, hematuria and urgency.  Musculoskeletal:  Negative for back pain, falls and myalgias.  Skin:  Negative for rash.  Neurological:  Negative for dizziness, sensory change, loss of consciousness, weakness and headaches.  Endo/Heme/Allergies:  Negative for environmental allergies. Does not bruise/bleed easily.  Psychiatric/Behavioral:  Negative for depression and suicidal ideas. The patient is not nervous/anxious and does not have insomnia.        Objective:    Physical Exam Constitutional:      General: He is not in acute distress.    Appearance: Normal appearance. He is not ill-appearing or toxic-appearing.  HENT:     Head: Normocephalic and atraumatic.     Right Ear: Tympanic membrane and external ear normal.     Left Ear: Tympanic membrane and external ear normal.     Nose: Nose normal. No congestion.     Mouth/Throat:     Mouth: Mucous membranes are dry.  Eyes:     General:        Right eye: No discharge.        Left eye: No discharge.     Extraocular Movements: Extraocular movements intact.     Conjunctiva/sclera: Conjunctivae normal.     Pupils: Pupils are equal, round, and reactive to light.  Cardiovascular:     Rate and Rhythm:  Regular rhythm.     Heart sounds: Normal heart sounds. No murmur heard. Pulmonary:     Effort: Pulmonary effort is normal.     Breath sounds: No wheezing or rhonchi.  Abdominal:     General: Abdomen is flat. Bowel sounds are normal.     Palpations: Abdomen is soft. There is no mass.     Tenderness: There is no abdominal tenderness.  Musculoskeletal:     Cervical back: Normal range of motion and neck supple. No tenderness.  Skin:    General: Skin is warm and dry.     Findings: Lesion present. No rash.     Comments: 3 mm raised lesion above upper lip. Pearline Cables.  Neurological:     Mental Status: He is alert and oriented to person, place, and time.  Psychiatric:        Behavior: Behavior normal.     BP 138/70 (BP Location: Right Arm, Patient Position: Sitting, Cuff Size: Normal)   Pulse 86   Resp 20   Ht 5' 6"  (1.676 m)   Wt 174 lb 6.4 oz (79.1 kg)   SpO2 96%   BMI 28.15 kg/m  Wt Readings from Last 3 Encounters:  04/05/22 174 lb 6.4 oz (79.1 kg)  12/13/21 173 lb (78.5 kg)  08/07/21 174 lb (78.9 kg)    Diabetic Foot Exam - Simple   No data filed    Lab Results  Component Value Date   WBC 3.8 (L) 03/29/2022   HGB 12.1 (L) 03/29/2022   HCT 34.8 (L) 03/29/2022   PLT 190.0 03/29/2022   GLUCOSE 198 (H) 03/29/2022   CHOL 120 03/29/2022   TRIG 97.0 03/29/2022   HDL 41.10 03/29/2022   LDLCALC 60 03/29/2022   ALT  25 03/29/2022   AST 28 03/29/2022   NA 140 03/29/2022   K 4.3 03/29/2022   CL 105 03/29/2022   CREATININE 0.80 03/29/2022   BUN 11 03/29/2022   CO2 27 03/29/2022   TSH 1.24 03/29/2022   PSA 0.81 04/10/2021   HGBA1C 8.1 (H) 03/29/2022   MICROALBUR 7.3 (H) 11/24/2015    Lab Results  Component Value Date   TSH 1.24 03/29/2022   Lab Results  Component Value Date   WBC 3.8 (L) 03/29/2022   HGB 12.1 (L) 03/29/2022   HCT 34.8 (L) 03/29/2022   MCV 97.5 03/29/2022   PLT 190.0 03/29/2022   Lab Results  Component Value Date   NA 140 03/29/2022   K 4.3  03/29/2022   CO2 27 03/29/2022   GLUCOSE 198 (H) 03/29/2022   BUN 11 03/29/2022   CREATININE 0.80 03/29/2022   BILITOT 0.8 03/29/2022   ALKPHOS 66 03/29/2022   AST 28 03/29/2022   ALT 25 03/29/2022   PROT 7.1 03/29/2022   ALBUMIN 4.5 03/29/2022   CALCIUM 10.0 03/29/2022   GFR 98.28 03/29/2022   Lab Results  Component Value Date   CHOL 120 03/29/2022   Lab Results  Component Value Date   HDL 41.10 03/29/2022   Lab Results  Component Value Date   LDLCALC 60 03/29/2022   Lab Results  Component Value Date   TRIG 97.0 03/29/2022   Lab Results  Component Value Date   CHOLHDL 3 03/29/2022   Lab Results  Component Value Date   HGBA1C 8.1 (H) 03/29/2022       Assessment & Plan:   COLONOSCOPY: 10/13/14 PAP: n/a PSA: 04/10/21 DEXA: n/a   Problem List Items Addressed This Visit     Hypertension    Well controlled, no changes to meds. Encouraged heart healthy diet such as the DASH diet and exercise as tolerated.       Relevant Medications   tadalafil (CIALIS) 5 MG tablet   Hyperlipidemia    Tolerating statin, encouraged heart healthy diet, avoid trans fats, minimize simple carbs and saturated fats. Increase exercise as tolerated      Relevant Medications   tadalafil (CIALIS) 5 MG tablet   Type 2 diabetes mellitus with hyperglycemia ([facility])    hgba1c acceptable, minimize simple carbs. Increase exercise as tolerated. Continue current meds. A1C improving and he has had a couple of episodes of low sugars. Recheck in 3 months      Preventative health care - Primary    Patient encouraged to maintain heart healthy diet, regular exercise, adequate sleep. Consider daily probiotics. Take medications as prescribed. Labs ordered and reviewed. Shingrix is the new shingles shot, 2 shots over 2-6 months, confirm coverage with insurance and document, then can return here for shots with nurse appt or at pharmacy.      Gastroesophageal reflux disease    Worsening over past month.  OTC omeprazole and famotidine have not helped. Avoid offending foods, start probiotics. Do not eat large meals in late evening and consider raising head of bed. Check H Pylori, start Omeprazole 40 mg in am and Famotidine 40 mg q pm. Referred to gastroenterology for evaluation of epigastric pain, reflux and for a screening colonoscopy, start fiber supplement and probiotic      Relevant Medications   omeprazole (PRILOSEC) 40 MG capsule   famotidine (PEPCID) 40 MG tablet   Other Relevant Orders   H. pylori antibody, IgG   Ambulatory referral to Gastroenterology   Skin lesion of  face    Roughly 3 mm and raised. Above lip, greyish color, he has an appointment with dermatology in a few months for now will start on Aldara cream 3 x a week.       Other Visit Diagnoses     Abdominal pain, unspecified abdominal location       Relevant Orders   H. pylori antibody, IgG   Colon cancer screening       Relevant Orders   Ambulatory referral to Gastroenterology       I have discontinued Margit Banda Risio "Joe"'s sildenafil and HYDROcodone-homatropine. I am also having him start on omeprazole, famotidine, tadalafil, and imiquimod. Additionally, I am having him maintain his meloxicam, albuterol, fluticasone, traMADol, fluticasone, zaleplon, methocarbamol, OneTouch Delica Plus [id number], valACYclovir, balsalazide, amitriptyline, Dexcom G6 Receiver, Dexcom G6 Sensor, Dexcom G6 Transmitter, rosuvastatin, fenofibrate, losartan, Trulicity, levocetirizine, azaTHIOprine, BD Pen Needle Nano 2nd Gen, olopatadine, metFORMIN, and Basaglar KwikPen.  Meds ordered this encounter  Medications   omeprazole (PRILOSEC) 40 MG capsule    Sig: Take 1 capsule (40 mg total) by mouth daily.    Dispense:  30 capsule    Refill:  3   famotidine (PEPCID) 40 MG tablet    Sig: Take 1 tablet (40 mg total) by mouth daily.    Dispense:  30 tablet    Refill:  3   tadalafil (CIALIS) 5 MG tablet    Sig: Take 1 tablet (5 mg total)  by mouth daily.    Dispense:  90 tablet    Refill:  3   imiquimod (ALDARA) 5 % cream    Sig: Apply topically 3 (three) times a week.    Dispense:  12 each    Refill:  0

## 2022-04-05 ENCOUNTER — Encounter: Payer: Self-pay | Admitting: Family Medicine

## 2022-04-05 ENCOUNTER — Ambulatory Visit (INDEPENDENT_AMBULATORY_CARE_PROVIDER_SITE_OTHER): Payer: Commercial Managed Care - HMO | Admitting: Family Medicine

## 2022-04-05 VITALS — BP 138/70 | HR 86 | Resp 20 | Ht 66.0 in | Wt 174.4 lb

## 2022-04-05 DIAGNOSIS — I10 Essential (primary) hypertension: Secondary | ICD-10-CM | POA: Diagnosis not present

## 2022-04-05 DIAGNOSIS — L989 Disorder of the skin and subcutaneous tissue, unspecified: Secondary | ICD-10-CM | POA: Insufficient documentation

## 2022-04-05 DIAGNOSIS — R109 Unspecified abdominal pain: Secondary | ICD-10-CM

## 2022-04-05 DIAGNOSIS — Z1211 Encounter for screening for malignant neoplasm of colon: Secondary | ICD-10-CM

## 2022-04-05 DIAGNOSIS — Z0000 Encounter for general adult medical examination without abnormal findings: Secondary | ICD-10-CM

## 2022-04-05 DIAGNOSIS — K219 Gastro-esophageal reflux disease without esophagitis: Secondary | ICD-10-CM | POA: Diagnosis not present

## 2022-04-05 DIAGNOSIS — E1165 Type 2 diabetes mellitus with hyperglycemia: Secondary | ICD-10-CM

## 2022-04-05 DIAGNOSIS — E782 Mixed hyperlipidemia: Secondary | ICD-10-CM

## 2022-04-05 HISTORY — DX: Gastro-esophageal reflux disease without esophagitis: K21.9

## 2022-04-05 MED ORDER — FAMOTIDINE 40 MG PO TABS: 40.0000 mg | ORAL_TABLET | Freq: Every day | ORAL | 3 refills | Status: DC

## 2022-04-05 MED ORDER — OMEPRAZOLE 40 MG PO CPDR: 40.0000 mg | DELAYED_RELEASE_CAPSULE | Freq: Every day | ORAL | 3 refills | Status: DC

## 2022-04-05 MED ORDER — TADALAFIL 5 MG PO TABS: 5.0000 mg | ORAL_TABLET | Freq: Every day | ORAL | 3 refills | Status: DC

## 2022-04-05 MED ORDER — IMIQUIMOD 5 % EX CREA: TOPICAL_CREAM | CUTANEOUS | 0 refills | Status: DC

## 2022-04-05 NOTE — Assessment & Plan Note (Signed)
Tolerating statin, encouraged heart healthy diet, avoid trans fats, minimize simple carbs and saturated fats. Increase exercise as tolerated 

## 2022-04-05 NOTE — Assessment & Plan Note (Addendum)
Worsening over past month. OTC omeprazole and famotidine have not helped. Avoid offending foods, start probiotics. Do not eat large meals in late evening and consider raising head of bed. Check H Pylori, start Omeprazole 40 mg in am and Famotidine 40 mg q pm. Referred to gastroenterology for evaluation of epigastric pain, reflux and for a screening colonoscopy, start fiber supplement and probiotic

## 2022-04-05 NOTE — Assessment & Plan Note (Signed)
Roughly 3 mm and raised. Above lip, greyish color, he has an appointment with dermatology in a few months for now will start on Aldara cream 3 x a week.

## 2022-04-05 NOTE — Patient Instructions (Signed)
Shingrix is the new shingles shot, 2 shots over 2-6 months, confirm coverage with insurance and document, then can return here for shots with nurse appt or at pharmacy     Preventive Care 58-58 Years Old, Male Preventive care refers to lifestyle choices and visits with your health care provider that can promote health and wellness. Preventive care visits are also called wellness exams. What can I expect for my preventive care visit? Counseling During your preventive care visit, your health care provider may ask about your: Medical history, including: Past medical problems. Family medical history. Current health, including: Emotional well-being. Home life and relationship well-being. Sexual activity. Lifestyle, including: Alcohol, nicotine or tobacco, and drug use. Access to firearms. Diet, exercise, and sleep habits. Safety issues such as seatbelt and bike helmet use. Sunscreen use. Work and work Statistician. Physical exam Your health care provider will check your: Height and weight. These may be used to calculate your BMI (body mass index). BMI is a measurement that tells if you are at a healthy weight. Waist circumference. This measures the distance around your waistline. This measurement also tells if you are at a healthy weight and may help predict your risk of certain diseases, such as type 2 diabetes and high blood pressure. Heart rate and blood pressure. Body temperature. Skin for abnormal spots. What immunizations do I need?  Vaccines are usually given at various ages, according to a schedule. Your health care provider will recommend vaccines for you based on your age, medical history, and lifestyle or other factors, such as travel or where you work. What tests do I need? Screening Your health care provider may recommend screening tests for certain conditions. This may include: Lipid and cholesterol levels. Diabetes screening. This is done by checking your blood sugar  (glucose) after you have not eaten for a while (fasting). Hepatitis B test. Hepatitis C test. HIV (human immunodeficiency virus) test. STI (sexually transmitted infection) testing, if you are at risk. Lung cancer screening. Prostate cancer screening. Colorectal cancer screening. Talk with your health care provider about your test results, treatment options, and if necessary, the need for more tests. Follow these instructions at home: Eating and drinking  Eat a diet that includes fresh fruits and vegetables, whole grains, lean protein, and low-fat dairy products. Take vitamin and mineral supplements as recommended by your health care provider. Do not drink alcohol if your health care provider tells you not to drink. If you drink alcohol: Limit how much you have to 0-2 drinks a day. Know how much alcohol is in your drink. In the [place] one drink equals one 12 oz bottle of beer (355 mL), one 5 oz glass of wine (148 mL), or one 1 oz glass of hard liquor (44 mL). Lifestyle Brush your teeth every morning and night with fluoride toothpaste. Floss one time each day. Exercise for at least 30 minutes 5 or more days each week. Do not use any products that contain nicotine or tobacco. These products include cigarettes, chewing tobacco, and vaping devices, such as e-cigarettes. If you need help quitting, ask your health care provider. Do not use drugs. If you are sexually active, practice safe sex. Use a condom or other form of protection to prevent STIs. Take aspirin only as told by your health care provider. Make sure that you understand how much to take and what form to take. Work with your health care provider to find out whether it is safe and beneficial for you to take aspirin daily.  Find healthy ways to manage stress, such as: Meditation, yoga, or listening to music. Journaling. Talking to a trusted person. Spending time with friends and family. Minimize exposure to UV radiation to reduce  your risk of skin cancer. Safety Always wear your seat belt while driving or riding in a vehicle. Do not drive: If you have been drinking alcohol. Do not ride with someone who has been drinking. When you are tired or distracted. While texting. If you have been using any mind-altering substances or drugs. Wear a helmet and other protective equipment during sports activities. If you have firearms in your house, make sure you follow all gun safety procedures. What's next? Go to your health care provider once a year for an annual wellness visit. Ask your health care provider how often you should have your eyes and teeth checked. Stay up to date on all vaccines. This information is not intended to replace advice given to you by your health care provider. Make sure you discuss any questions you have with your health care provider. Document Revised: 03/29/2021 Document Reviewed: 03/29/2021 Elsevier Patient Education  [employer].

## 2022-04-05 NOTE — Assessment & Plan Note (Signed)
Patient encouraged to maintain heart healthy diet, regular exercise, adequate sleep. Consider daily probiotics. Take medications as prescribed. Labs ordered and reviewed. Shingrix is the new shingles shot, 2 shots over 2-6 months, confirm coverage with insurance and document, then can return here for shots with nurse appt or at pharmacy.

## 2022-04-05 NOTE — Assessment & Plan Note (Signed)
hgba1c acceptable, minimize simple carbs. Increase exercise as tolerated. Continue current meds. A1C improving and he has had a couple of episodes of low sugars. Recheck in 3 months

## 2022-04-05 NOTE — Assessment & Plan Note (Signed)
Well controlled, no changes to meds. Encouraged heart healthy diet such as the DASH diet and exercise as tolerated.  °

## 2022-04-06 LAB — H. PYLORI ANTIBODY, IGG: H Pylori IgG: NEGATIVE

## 2022-04-08 ENCOUNTER — Encounter: Payer: Self-pay | Admitting: Family Medicine

## 2022-04-09 ENCOUNTER — Other Ambulatory Visit: Payer: Self-pay

## 2022-04-09 MED ORDER — ROSUVASTATIN CALCIUM 5 MG PO TABS: ORAL_TABLET | ORAL | 1 refills | Status: DC

## 2022-04-23 ENCOUNTER — Other Ambulatory Visit: Payer: Self-pay

## 2022-04-23 MED ORDER — TRULICITY 3 MG/0.5ML ~~LOC~~ SOAJ: 3.0000 mg | SUBCUTANEOUS | 1 refills | Status: DC

## 2022-05-06 ENCOUNTER — Encounter: Payer: Self-pay | Admitting: Family Medicine

## 2022-05-07 MED ORDER — MONTELUKAST SODIUM 10 MG PO TABS
10.0000 mg | ORAL_TABLET | Freq: Every day | ORAL | 1 refills | Status: DC
Start: 2022-05-07 — End: 2022-10-26

## 2022-05-14 ENCOUNTER — Other Ambulatory Visit: Payer: Self-pay

## 2022-05-14 DIAGNOSIS — E785 Hyperlipidemia, unspecified: Secondary | ICD-10-CM

## 2022-05-14 DIAGNOSIS — E119 Type 2 diabetes mellitus without complications: Secondary | ICD-10-CM

## 2022-05-14 DIAGNOSIS — I10 Essential (primary) hypertension: Secondary | ICD-10-CM

## 2022-05-14 DIAGNOSIS — E7849 Other hyperlipidemia: Secondary | ICD-10-CM

## 2022-05-14 MED ORDER — FENOFIBRATE 160 MG PO TABS: ORAL_TABLET | ORAL | 1 refills | Status: DC

## 2022-05-14 MED ORDER — AZATHIOPRINE 50 MG PO TABS: ORAL_TABLET | ORAL | 1 refills | Status: DC

## 2022-05-14 MED ORDER — LOSARTAN POTASSIUM 100 MG PO TABS: ORAL_TABLET | ORAL | 1 refills | Status: DC

## 2022-05-22 ENCOUNTER — Ambulatory Visit: Payer: Commercial Managed Care - HMO | Admitting: Podiatry

## 2022-05-22 DIAGNOSIS — L603 Nail dystrophy: Secondary | ICD-10-CM

## 2022-05-22 DIAGNOSIS — M7989 Other specified soft tissue disorders: Secondary | ICD-10-CM

## 2022-05-22 DIAGNOSIS — M2031 Hallux varus (acquired), right foot: Secondary | ICD-10-CM

## 2022-05-22 NOTE — Progress Notes (Signed)
Subjective:   Patient ID: Curtis Kennedy, male   DOB: 56 y.o.   MRN: [id number]   HPI Chief Complaint  Patient presents with   Diabetes    Diabetic foot care, right hallux has a spilt in the nail that won't grow out, pt want to know if there is a medication or someing he can using to help  A1c- 8.0 BG- Pt doesn't take     56 year old male presents with the above complaints.  He has a cyst on the right big toe he previously had this drained.  It is coming back but its been the same size for quite some time and is not causing any pain.  No numbness or tingling.  He was told he does not need to have surgery to have this removed.  Also he has noticed that his toenail is split.  No pain or swelling or redness to the toenail.     Review of Systems  All other systems reviewed and are negative.  Past Medical History:  Diagnosis Date   Allergy    Anemia 07/02/2017   Colitis, ulcerative ([place]) Dx'd 2001   Dr. Erlene Quan; [facility]--has been in remission since about 2009   Diabetes mellitus without complication ([place]) 56 yrs old   type 2 (dx'd after long courses of prednisone for his UC)   Erectile dysfunction    Family history of tinea corporis 07/25/2012   Gastroesophageal reflux disease 04/05/2022   Hyperlipidemia    Hypertension    Insomnia    Low back pain 08/07/2016   Preventative health care 12/31/2016   Right shoulder pain 12/31/2016   Sinusitis 08/28/2015    Past Surgical History:  Procedure Laterality Date   TRIGGER FINGER RELEASE  10/15/2009   b/l middle fingers     Current Outpatient Medications:    albuterol (PROVENTIL HFA;VENTOLIN HFA) 108 (90 Base) MCG/ACT inhaler, Inhale 2 puffs into the lungs every 6 (six) hours as needed for wheezing or shortness of breath., Disp: 1 Inhaler, Rfl: 0   amitriptyline (ELAVIL) 10 MG tablet, TAKE 1/2 TO 2 TABLETS(5 TO 20 MG) BY MOUTH AT BEDTIME AS NEEDED FOR SLEEP, Disp: 40 tablet, Rfl: 1   azaTHIOprine (IMURAN) 50 MG tablet, TAKE 4.5  TABLETS BY MOUTH EVERY DAY, Disp: 405 tablet, Rfl: 1   balsalazide (COLAZAL) 750 MG capsule, TAKE 3 CAPSULES(2250 MG) BY MOUTH THREE TIMES DAILY, Disp: 270 capsule, Rfl: 3   BD PEN NEEDLE NANO 2ND GEN 32G X 4 MM MISC, USE 1 NEEDLE DAILY AS DIRECTED, Disp: 100 each, Rfl: 11   Continuous Blood Gluc Receiver (DEXCOM G6 RECEIVER) DEVI, Use for continuous blood glucose monitoring, Disp: 1 each, Rfl: 0   Continuous Blood Gluc Sensor (DEXCOM G6 SENSOR) MISC, Use for continuous blood glucose monitoring.  Replace every 10 days., Disp: 4 each, Rfl: 0   Continuous Blood Gluc Transmit (DEXCOM G6 TRANSMITTER) MISC, Use for continuous blood glucose monitoring.  Replace every 90 days, Disp: 1 each, Rfl: 1   Dulaglutide (TRULICITY) 3 BE/6.7JQ SOPN, Inject 3 mg as directed once a week., Disp: 6 mL, Rfl: 1   famotidine (PEPCID) 40 MG tablet, Take 1 tablet (40 mg total) by mouth daily., Disp: 30 tablet, Rfl: 3   fenofibrate 160 MG tablet, TAKE 1 TABLET(160 MG) BY MOUTH DAILY, Disp: 90 tablet, Rfl: 1   fluticasone (FLONASE) 50 MCG/ACT nasal spray, Place 2 sprays into both nostrils daily., Disp: 16 g, Rfl: 5   fluticasone (FLOVENT HFA) 110 MCG/ACT  inhaler, Inhale 2 puffs into the lungs 2 (two) times daily., Disp: 1 Inhaler, Rfl: 2   imiquimod (ALDARA) 5 % cream, Apply topically 3 (three) times a week., Disp: 12 each, Rfl: 0   Insulin Glargine (BASAGLAR KWIKPEN) 100 UNIT/ML, ADMINISTER 50 UNITS UNDER THE SKIN DAILY, Disp: 15 mL, Rfl: 3   Lancets (ONETOUCH DELICA PLUS [id number]) MISC, USE AS DIRECTED TWICE DAILY TO CHECK BLOOD SUGAR, Disp: 100 each, Rfl: 0   levocetirizine (XYZAL) 5 MG tablet, TAKE 1 TABLET(5 MG) BY MOUTH EVERY EVENING, Disp: 90 tablet, Rfl: 3   losartan (COZAAR) 100 MG tablet, TAKE 1 TABLET(100 MG) BY MOUTH DAILY, Disp: 90 tablet, Rfl: 1   meloxicam (MOBIC) 15 MG tablet, Take 15 mg by mouth daily., Disp: , Rfl:    metFORMIN (GLUCOPHAGE) 1000 MG tablet, TAKE 1 TABLET(1000 MG) BY MOUTH TWICE DAILY WITH A  MEAL, Disp: 180 tablet, Rfl: 1   methocarbamol (ROBAXIN) 500 MG tablet, Take 1 tablet (500 mg total) by mouth every 6 (six) hours as needed for muscle spasms., Disp: 60 tablet, Rfl: 2   montelukast (SINGULAIR) 10 MG tablet, Take 1 tablet (10 mg total) by mouth at bedtime., Disp: 90 tablet, Rfl: 1   olopatadine (PATANOL) 0.1 % ophthalmic solution, Place 1 drop into both eyes 2 (two) times daily., Disp: 5 mL, Rfl: 3   omeprazole (PRILOSEC) 40 MG capsule, Take 1 capsule (40 mg total) by mouth daily., Disp: 30 capsule, Rfl: 3   rosuvastatin (CRESTOR) 5 MG tablet, TAKE 1 TABLET(5 MG) BY MOUTH DAILY, Disp: 90 tablet, Rfl: 1   tadalafil (CIALIS) 5 MG tablet, Take 1 tablet (5 mg total) by mouth daily., Disp: 90 tablet, Rfl: 3   traMADol (ULTRAM) 50 MG tablet, Take 1 tablet (50 mg total) by mouth every 8 (eight) hours as needed., Disp: 60 tablet, Rfl: 0   valACYclovir (VALTREX) 1000 MG tablet, Take 1 tablet (1,000 mg total) by mouth 2 (two) times daily as needed., Disp: 60 tablet, Rfl: 3   zaleplon (SONATA) 10 MG capsule, Take 1 capsule (10 mg total) by mouth at bedtime as needed for sleep., Disp: 30 capsule, Rfl: 2  Allergies  Allergen Reactions   Penicillins Hives   Simvastatin     Myalgias           Objective:  Physical Exam  General: AAO x3, NAD  Dermatological: On the right hallux nail there is a small vertical split with the distal portion of the toenail.  Mild dystrophy of the nail.  There is no edema, erythema or signs of infection to the toenail site.  Vascular: Dorsalis Pedis artery and Posterior Tibial artery pedal pulses are 2/4 bilateral with immedate capillary fill time. There is no pain with calf compression, swelling, warmth, erythema.   Neruologic: Grossly intact via light touch bilateral.   Musculoskeletal: Hallux malleolus right there is crepitation with range of motion of the IPJ.  There is an adjacent fluid-filled soft tissue mass in the dorsal medial aspect of the IPJ  without any tenderness palpation there is no edema, erythema or signs of infection.  Gait: Unassisted, Nonantalgic.       Assessment:   56 year old male with soft tissue mass, hallux IPJ arthritis; onychodystrophy     Plan:  -Treatment options discussed including all alternatives, risks, and complications. -Etiology of symptoms were discussed -Soft tissue mass is not causing any pain.  We discussed aspiration or surgical excision but he wants to monitor for now.  Should it become  painful get larger we would recommend excision of the mass. -In regards to the toenail discussed nail strengthening to help with a split toenail.  Monitor for any signs or symptoms of infection. -Daily foot inspection  *X-ray next appointment    Trula Slade DPM

## 2022-05-22 NOTE — Patient Instructions (Addendum)
You can start a biotin supplement and also a nail strengthener to help the split toenail  Diabetes Mellitus and Foot Care Foot care is an important part of your health, especially when you have diabetes. Diabetes may cause you to have problems because of poor blood flow (circulation) to your feet and legs, which can cause your skin to: Become thinner and drier. Break more easily. Heal more slowly. Peel and crack. You may also have nerve damage (neuropathy) in your legs and feet, causing decreased feeling in them. This means that you may not notice minor injuries to your feet that could lead to more serious problems. Noticing and addressing any potential problems early is the best way to prevent future foot problems. How to care for your feet Foot hygiene  Wash your feet daily with warm water and mild soap. Do not use hot water. Then, pat your feet and the areas between your toes until they are completely dry. Do not soak your feet as this can dry your skin. Trim your toenails straight across. Do not dig under them or around the cuticle. File the edges of your nails with an emery board or nail file. Apply a moisturizing lotion or petroleum jelly to the skin on your feet and to dry, brittle toenails. Use lotion that does not contain alcohol and is unscented. Do not apply lotion between your toes. Shoes and socks Wear clean socks or stockings every day. Make sure they are not too tight. Do not wear knee-high stockings since they may decrease blood flow to your legs. Wear shoes that fit properly and have enough cushioning. Always look in your shoes before you put them on to be sure there are no objects inside. To break in new shoes, wear them for just a few hours a day. This prevents injuries on your feet. Wounds, scrapes, corns, and calluses  Check your feet daily for blisters, cuts, bruises, sores, and redness. If you cannot see the bottom of your feet, use a mirror or ask someone for help. Do  not cut corns or calluses or try to remove them with medicine. If you find a minor scrape, cut, or break in the skin on your feet, keep it and the skin around it clean and dry. You may clean these areas with mild soap and water. Do not clean the area with peroxide, alcohol, or iodine. If you have a wound, scrape, corn, or callus on your foot, look at it several times a day to make sure it is healing and not infected. Check for: Redness, swelling, or pain. Fluid or blood. Warmth. Pus or a bad smell. General tips Do not cross your legs. This may decrease blood flow to your feet. Do not use heating pads or hot water bottles on your feet. They may burn your skin. If you have lost feeling in your feet or legs, you may not know this is happening until it is too late. Protect your feet from hot and cold by wearing shoes, such as at the beach or on hot pavement. Schedule a complete foot exam at least once a year (annually) or more often if you have foot problems. Report any cuts, sores, or bruises to your health care provider immediately. Where to find more information American Diabetes Association: www.diabetes.org Association of Diabetes Care & Education Specialists: www.diabeteseducator.org Contact a health care provider if: You have a medical condition that increases your risk of infection and you have any cuts, sores, or bruises on your feet.  You have an injury that is not healing. You have redness on your legs or feet. You feel burning or tingling in your legs or feet. You have pain or cramps in your legs and feet. Your legs or feet are numb. Your feet always feel cold. You have pain around any toenails. Get help right away if: You have a wound, scrape, corn, or callus on your foot and: You have pain, swelling, or redness that gets worse. You have fluid or blood coming from the wound, scrape, corn, or callus. Your wound, scrape, corn, or callus feels warm to the touch. You have pus or a  bad smell coming from the wound, scrape, corn, or callus. You have a fever. You have a red line going up your leg. Summary Check your feet every day for blisters, cuts, bruises, sores, and redness. Apply a moisturizing lotion or petroleum jelly to the skin on your feet and to dry, brittle toenails. Wear shoes that fit properly and have enough cushioning. If you have foot problems, report any cuts, sores, or bruises to your health care provider immediately. Schedule a complete foot exam at least once a year (annually) or more often if you have foot problems. This information is not intended to replace advice given to you by your health care provider. Make sure you discuss any questions you have with your health care provider. Document Revised: 04/21/2020 Document Reviewed: 04/21/2020 Elsevier Patient Education  [place].

## 2022-06-01 ENCOUNTER — Other Ambulatory Visit: Payer: Self-pay

## 2022-06-01 DIAGNOSIS — I10 Essential (primary) hypertension: Secondary | ICD-10-CM

## 2022-06-01 DIAGNOSIS — E119 Type 2 diabetes mellitus without complications: Secondary | ICD-10-CM

## 2022-06-01 DIAGNOSIS — E785 Hyperlipidemia, unspecified: Secondary | ICD-10-CM

## 2022-06-01 MED ORDER — BALSALAZIDE DISODIUM 750 MG PO CAPS: ORAL_CAPSULE | ORAL | 3 refills | Status: DC

## 2022-07-09 NOTE — Assessment & Plan Note (Signed)
Tolerating statin, encouraged heart healthy diet, avoid trans fats, minimize simple carbs and saturated fats. Increase exercise as tolerated 

## 2022-07-09 NOTE — Assessment & Plan Note (Signed)
Avoid offending foods, start probiotics. Do not eat large meals in late evening and consider raising head of bed.  

## 2022-07-09 NOTE — Assessment & Plan Note (Signed)
Well controlled, no changes to meds. Encouraged heart healthy diet such as the DASH diet and exercise as tolerated.  °

## 2022-07-09 NOTE — Assessment & Plan Note (Signed)
hgba1c acceptable, minimize simple carbs. Increase exercise as tolerated. Continue current meds 

## 2022-07-10 ENCOUNTER — Ambulatory Visit (INDEPENDENT_AMBULATORY_CARE_PROVIDER_SITE_OTHER): Payer: Commercial Managed Care - HMO | Admitting: Family Medicine

## 2022-07-10 DIAGNOSIS — E782 Mixed hyperlipidemia: Secondary | ICD-10-CM

## 2022-07-10 DIAGNOSIS — L989 Disorder of the skin and subcutaneous tissue, unspecified: Secondary | ICD-10-CM

## 2022-07-10 DIAGNOSIS — K219 Gastro-esophageal reflux disease without esophagitis: Secondary | ICD-10-CM

## 2022-07-10 DIAGNOSIS — E1165 Type 2 diabetes mellitus with hyperglycemia: Secondary | ICD-10-CM | POA: Diagnosis not present

## 2022-07-10 DIAGNOSIS — I10 Essential (primary) hypertension: Secondary | ICD-10-CM

## 2022-07-10 MED ORDER — BASAGLAR KWIKPEN 100 UNIT/ML ~~LOC~~ SOPN: 50.0000 [IU] | PEN_INJECTOR | Freq: Every day | SUBCUTANEOUS | 1 refills | Status: DC

## 2022-07-10 MED ORDER — IMIQUIMOD 5 % EX CREA: TOPICAL_CREAM | CUTANEOUS | 0 refills | Status: DC

## 2022-07-10 NOTE — Progress Notes (Signed)
Subjective:   By signing my name below, I, Curtis Kennedy, attest that this documentation has been prepared under the direction and in the presence of Mosie Lukes, MD 07/10/2022.     Patient ID: Curtis Kennedy, male    DOB: July 21, 1966, 56 y.o.   MRN: [id number]  No chief complaint on file.  HPI Patient is in today for an office visit.  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 He states that he does pick at these cutaneous horns and that only the largest one causes pain upon touch.   GERD: He is currently taking Famotidine 40 mg and Omeprazole 40 mg to manage his GERD.   Immunizations: He has been informed about receiving COVID-19, Flu, and Shingles immunizations.   Podiatry: He recently saw a podiatrist who recommended that he takes a biotin and nail hardener to improve the integrity of his toe nails.   Refills: He is requesting a refill on BASAGLAR for 90 days.   Trulicity: He is inquiring whether he can increase his Trulicity to 4.5 mg.   Respiratory illness: He reports that he suffered a respiratory illness 2.5 weeks ago when returning from [place]. He states that he took OTC medications to manage his cough but is feeling better today. He chooses not to receive the Flu immunization until he feels that he has fully recovered from his respiratory illness.   Past Medical History:  Diagnosis Date   Allergy    Anemia 07/02/2017   Colitis, ulcerative ([place]) Dx'd 2001   Dr. Erlene Quan; [facility]--has been in remission since about 2009   Diabetes mellitus without complication ([place]) 56 yrs old   type 2 (dx'd after long courses of prednisone for his UC)   Erectile dysfunction    Family history of tinea corporis 07/25/2012   Gastroesophageal reflux disease 04/05/2022   Hyperlipidemia    Hypertension    Insomnia    Low back pain 08/07/2016   Preventative health care 12/31/2016   Right shoulder pain 12/31/2016    Sinusitis 08/28/2015   Past Surgical History:  Procedure Laterality Date   TRIGGER FINGER RELEASE  10/15/2009   b/l middle fingers   Family History  Problem Relation Age of Onset   Diabetes Father    Hyperlipidemia Father    Hypertension Father    Kidney disease Father        Kidney Failure   Heart disease Father 18       MI   Hypertension Sister    Hypertension Brother    Diabetes Paternal Grandfather    Hypertension Brother    Hyperlipidemia Brother    Heart disease Brother        Valve Surgery ?   Diabetes Brother    Hypertension Brother    ADD / ADHD Brother    Social History   Socioeconomic History   Marital status: Married    Spouse name: Not on file   Number of children: Not on file   Years of education: Not on file   Highest education level: Not on file  Occupational History   Not on file  Tobacco Use   Smoking status: Never   Smokeless tobacco: Never  Substance and Sexual Activity   Alcohol use: Never   Drug use: Never   Sexual activity: Yes    Partners: Female    Birth control/protection: None    Comment: lives with wife, does office work follows  with  Other Topics Concern   Not on file  Social History Narrative   Married, 58 y/o girl.     Lives in [place], grew up in [place]--relocated to [facility] [place].   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         Social Determinants of Health   Financial Resource Strain: Not on file  Food Insecurity: Not on file  Transportation Needs: Not on file  Physical Activity: Not on file  Stress: Not on file  Social Connections: Not on file  Intimate Partner Violence: Not on file   Outpatient Medications Prior to Visit  Medication Sig Dispense Refill   albuterol (PROVENTIL HFA;VENTOLIN HFA) 108 (90 Base) MCG/ACT inhaler Inhale 2 puffs into the lungs every 6 (six) hours as needed for wheezing or shortness of breath. 1 Inhaler 0    amitriptyline (ELAVIL) 10 MG tablet TAKE 1/2 TO 2 TABLETS(5 TO 20 MG) BY MOUTH AT BEDTIME AS NEEDED FOR SLEEP 40 tablet 1   azaTHIOprine (IMURAN) 50 MG tablet TAKE 4.5 TABLETS BY MOUTH EVERY DAY 405 tablet 1   balsalazide (COLAZAL) 750 MG capsule TAKE 3 CAPSULES(2250 MG) BY MOUTH THREE TIMES DAILY 270 capsule 3   BD PEN NEEDLE NANO 2ND GEN 32G X 4 MM MISC USE 1 NEEDLE DAILY AS DIRECTED 100 each 11   Continuous Blood Gluc Receiver (DEXCOM G6 RECEIVER) DEVI Use for continuous blood glucose monitoring 1 each 0   Continuous Blood Gluc Sensor (DEXCOM G6 SENSOR) MISC Use for continuous blood glucose monitoring.  Replace every 10 days. 4 each 0   Continuous Blood Gluc Transmit (DEXCOM G6 TRANSMITTER) MISC Use for continuous blood glucose monitoring.  Replace every 90 days 1 each 1   Dulaglutide (TRULICITY) 3 AJ/2.8NO SOPN Inject 3 mg as directed once a week. 6 mL 1   famotidine (PEPCID) 40 MG tablet Take 1 tablet (40 mg total) by mouth daily. 30 tablet 3   fenofibrate 160 MG tablet TAKE 1 TABLET(160 MG) BY MOUTH DAILY 90 tablet 1   fluticasone (FLONASE) 50 MCG/ACT nasal spray Place 2 sprays into both nostrils daily. 16 g 5   fluticasone (FLOVENT HFA) 110 MCG/ACT inhaler Inhale 2 puffs into the lungs 2 (two) times daily. 1 Inhaler 2   imiquimod (ALDARA) 5 % cream Apply topically 3 (three) times a week. 12 each 0   Insulin Glargine (BASAGLAR KWIKPEN) 100 UNIT/ML ADMINISTER 50 UNITS UNDER THE SKIN DAILY 15 mL 3   Lancets (ONETOUCH DELICA PLUS [id number]) MISC USE AS DIRECTED TWICE DAILY TO CHECK BLOOD SUGAR 100 each 0   levocetirizine (XYZAL) 5 MG tablet TAKE 1 TABLET(5 MG) BY MOUTH EVERY EVENING 90 tablet 3   losartan (COZAAR) 100 MG tablet TAKE 1 TABLET(100 MG) BY MOUTH DAILY 90 tablet 1   meloxicam (MOBIC) 15 MG tablet Take 15 mg by mouth daily.     metFORMIN (GLUCOPHAGE) 1000 MG tablet TAKE 1 TABLET(1000 MG) BY MOUTH TWICE DAILY WITH A MEAL 180 tablet 1   methocarbamol (ROBAXIN) 500 MG tablet Take 1  tablet (500 mg total) by mouth every 6 (six) hours as needed for muscle spasms. 60 tablet 2   montelukast (SINGULAIR) 10 MG tablet Take 1 tablet (10 mg total) by mouth at bedtime. 90 tablet 1   olopatadine (PATANOL) 0.1 % ophthalmic solution Place 1 drop into both eyes 2 (two) times daily. 5 mL 3   omeprazole (PRILOSEC) 40  MG capsule Take 1 capsule (40 mg total) by mouth daily. 30 capsule 3   rosuvastatin (CRESTOR) 5 MG tablet TAKE 1 TABLET(5 MG) BY MOUTH DAILY 90 tablet 1   tadalafil (CIALIS) 5 MG tablet Take 1 tablet (5 mg total) by mouth daily. 90 tablet 3   traMADol (ULTRAM) 50 MG tablet Take 1 tablet (50 mg total) by mouth every 8 (eight) hours as needed. 60 tablet 0   valACYclovir (VALTREX) 1000 MG tablet Take 1 tablet (1,000 mg total) by mouth 2 (two) times daily as needed. 60 tablet 3   zaleplon (SONATA) 10 MG capsule Take 1 capsule (10 mg total) by mouth at bedtime as needed for sleep. 30 capsule 2   No facility-administered medications prior to visit.   Allergies  Allergen Reactions   Penicillins Hives   Simvastatin     Myalgias   ROS    Objective:    Physical Exam Constitutional:      General: He is not in acute distress.    Appearance: Normal appearance. He is not ill-appearing.  HENT:     Head: Normocephalic and atraumatic.     Right Ear: External ear normal.     Left Ear: External ear normal.     Mouth/Throat:     Mouth: Mucous membranes are moist.     Pharynx: Oropharynx is clear.  Eyes:     Extraocular Movements: Extraocular movements intact.     Pupils: Pupils are equal, round, and reactive to light.  Cardiovascular:     Rate and Rhythm: Normal rate and regular rhythm.     Pulses: Normal pulses.     Heart sounds: Normal heart sounds. No murmur heard.    No gallop.  Pulmonary:     Effort: Pulmonary effort is normal. No respiratory distress.     Breath sounds: Normal breath sounds. No wheezing or rales.  Abdominal:     General: Bowel sounds are normal.   Skin:    General: Skin is warm and dry.     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   Neurological:     Mental Status: He is alert and oriented to person, place, and time.  Psychiatric:        Mood and Affect: Mood normal.        Behavior: Behavior normal.        Judgment: Judgment normal.    There were no vitals taken for this visit. Wt Readings from Last 3 Encounters:  04/05/22 174 lb 6.4 oz (79.1 kg)  12/13/21 173 lb (78.5 kg)  08/07/21 174 lb (78.9 kg)   Diabetic Foot Exam - Simple   No data filed    Lab Results  Component Value Date   WBC 3.8 (L) 03/29/2022   HGB 12.1 (L) 03/29/2022   HCT 34.8 (L) 03/29/2022   PLT 190.0 03/29/2022   GLUCOSE 198 (H) 03/29/2022   CHOL 120 03/29/2022   TRIG 97.0 03/29/2022   HDL 41.10 03/29/2022   LDLCALC 60 03/29/2022   ALT 25 03/29/2022   AST 28 03/29/2022   NA 140 03/29/2022   K 4.3 03/29/2022   CL 105 03/29/2022   CREATININE 0.80 03/29/2022   BUN 11 03/29/2022   CO2 27 03/29/2022   TSH 1.24 03/29/2022   PSA 0.81 04/10/2021   HGBA1C 8.1 (H) 03/29/2022   MICROALBUR 7.3 (H) 11/24/2015   Lab Results  Component Value Date   TSH 1.24 03/29/2022   Lab Results  Component Value  Date   WBC 3.8 (L) 03/29/2022   HGB 12.1 (L) 03/29/2022   HCT 34.8 (L) 03/29/2022   MCV 97.5 03/29/2022   PLT 190.0 03/29/2022   Lab Results  Component Value Date   NA 140 03/29/2022   K 4.3 03/29/2022   CO2 27 03/29/2022   GLUCOSE 198 (H) 03/29/2022   BUN 11 03/29/2022   CREATININE 0.80 03/29/2022   BILITOT 0.8 03/29/2022   ALKPHOS 66 03/29/2022   AST 28 03/29/2022   ALT 25 03/29/2022   PROT 7.1 03/29/2022   ALBUMIN 4.5 03/29/2022   CALCIUM 10.0 03/29/2022   GFR 98.28 03/29/2022   Lab Results  Component Value Date   CHOL 120 03/29/2022   Lab Results  Component Value Date   HDL 41.10 03/29/2022   Lab Results  Component Value Date   LDLCALC 60 03/29/2022   Lab Results  Component Value Date   TRIG 97.0 03/29/2022    Lab Results  Component Value Date   CHOLHDL 3 03/29/2022   Lab Results  Component Value Date   HGBA1C 8.1 (H) 03/29/2022      Assessment & Plan:   Problem List Items Addressed This Visit       Cardiovascular and Mediastinum   Hypertension     Digestive   Gastroesophageal reflux disease     Endocrine   Type 2 diabetes mellitus with hyperglycemia ([place])     Other   Hyperlipidemia   No orders of the defined types were placed in this encounter.  I, Curtis Kennedy, personally preformed the services described in this documentation.  All medical record entries made by the scribe were at my direction and in my presence.  I have reviewed the chart and discharge instructions (if applicable) and agree that the record reflects my personal performance and is accurate and complete. 07/10/2022  I,Mohammed Iqbal,acting as a scribe for Penni Homans, MD.,have documented all relevant documentation on the behalf of Penni Homans, MD,as directed by  Penni Homans, MD while in the presence of Penni Homans, MD.  Curtis Kennedy

## 2022-07-10 NOTE — Patient Instructions (Addendum)
Stop Omeprazole first, change to every other day when ready if no flare in symptoms can use just as needed Continue Famotidine for nowFood Choices for Gastroesophageal Reflux Disease, Adult When you have gastroesophageal reflux disease (GERD), the foods you eat and your eating habits are very important. Choosing the right foods can help ease your discomfort. Think about working with a food expert (dietitian) to help you make good choices. What are tips for following this plan?  Shingrix is the new shingles shot, 2 shots over 2-6 months, confirm coverage with insurance and document, then can return here for shots with nurse appt or at pharmacy    Reading food labels Look for foods that are low in saturated fat. Foods that may help with your symptoms include: Foods that have less than 5% of daily value (DV) of fat. Foods that have 0 grams of trans fat. Cooking Do not fry your food. Cook your food by baking, steaming, grilling, or broiling. These are all methods that do not need a lot of fat for cooking. To add flavor, try to use herbs that are low in spice and acidity. Meal planning  Choose healthy foods that are low in fat, such as: Fruits and vegetables. Whole grains. Low-fat dairy products. Lean meats, fish, and poultry. Eat small meals often instead of eating 3 large meals each day. Eat your meals slowly in a place where you are relaxed. Avoid bending over or lying down until 2-3 hours after eating. Limit high-fat foods such as fatty meats or fried foods. Limit your intake of fatty foods, such as oils, butter, and shortening. Avoid the following as told by your doctor: Foods that cause symptoms. These may be different for different people. Keep a food diary to keep track of foods that cause symptoms. Alcohol. Drinking a lot of liquid with meals. Eating meals during the 2-3 hours before bed. Lifestyle Stay at a healthy weight. Ask your doctor what weight is healthy for you. If you  need to lose weight, work with your doctor to do so safely. Exercise for at least 30 minutes on 5 or more days each week, or as told by your doctor. Wear loose-fitting clothes. Do not smoke or use any products that contain nicotine or tobacco. If you need help quitting, ask your doctor. Sleep with the head of your bed higher than your feet. Use a wedge under the mattress or blocks under the bed frame to raise the head of the bed. Chew sugar-free gum after meals. What foods should eat?  Eat a healthy, well-balanced diet of fruits, vegetables, whole grains, low-fat dairy products, lean meats, fish, and poultry. Each person is different. Foods that may cause symptoms in one person may not cause any symptoms in another person. Work with your doctor to find foods that are safe for you. The items listed above may not be a complete list of what you can eat and drink. Contact a food expert for more options. What foods should I avoid? Limiting some of these foods may help in managing the symptoms of GERD. Everyone is different. Talk with a food expert or your doctor to help you find the exact foods to avoid, if any. Fruits Any fruits prepared with added fat. Any fruits that cause symptoms. For some people, this may include citrus fruits, such as oranges, grapefruit, pineapple, and lemons. Vegetables Deep-fried vegetables. [place] fries. Any vegetables prepared with added fat. Any vegetables that cause symptoms. For some people, this may include tomatoes and  tomato products, chili peppers, onions and garlic, and horseradish. Grains Pastries or quick breads with added fat. Meats and other proteins High-fat meats, such as fatty beef or pork, hot dogs, ribs, ham, sausage, salami, and bacon. Fried meat or protein, including fried fish and fried chicken. Nuts and nut butters, in large amounts. Dairy Whole milk and chocolate milk. Sour cream. Cream. Ice cream. Cream cheese. Milkshakes. Fats and oils Butter.  Margarine. Shortening. Ghee. Beverages Coffee and tea, with or without caffeine. Carbonated beverages. Sodas. Energy drinks. Fruit juice made with acidic fruits, such as orange or grapefruit. Tomato juice. Alcoholic drinks. Sweets and desserts Chocolate and cocoa. Donuts. Seasonings and condiments Pepper. Peppermint and spearmint. Added salt. Any condiments, herbs, or seasonings that cause symptoms. For some people, this may include curry, hot sauce, or vinegar-based salad dressings. The items listed above may not be a complete list of what you should not eat and drink. Contact a food expert for more options. Questions to ask your doctor Diet and lifestyle changes are often the first steps that are taken to manage symptoms of GERD. If diet and lifestyle changes do not help, talk with your doctor about taking medicines. Where to find more information International Foundation for Gastrointestinal Disorders: aboutgerd.org Summary When you have GERD, food and lifestyle choices are very important in easing your symptoms. Eat small meals often instead of 3 large meals a day. Eat your meals slowly and in a place where you are relaxed. Avoid bending over or lying down until 2-3 hours after eating. Limit high-fat foods such as fatty meats or fried foods. This information is not intended to replace advice given to you by your health care provider. Make sure you discuss any questions you have with your health care provider. Document Revised: 04/11/2020 Document Reviewed: 04/11/2020 Elsevier Patient Education  [place].

## 2022-07-11 LAB — TSH: TSH: 0.95 u[IU]/mL (ref 0.35–5.50)

## 2022-07-11 LAB — CBC
HCT: 34.8 % — ABNORMAL LOW (ref 39.0–52.0)
Hemoglobin: 12.4 g/dL — ABNORMAL LOW (ref 13.0–17.0)
MCHC: 35.6 g/dL (ref 30.0–36.0)
MCV: 98.4 fl (ref 78.0–100.0)
Platelets: 207 10*3/uL (ref 150.0–400.0)
RBC: 3.53 Mil/uL — ABNORMAL LOW (ref 4.22–5.81)
RDW: 14.8 % (ref 11.5–15.5)
WBC: 4.6 10*3/uL (ref 4.0–10.5)

## 2022-07-11 LAB — COMPREHENSIVE METABOLIC PANEL
ALT: 34 U/L (ref 0–53)
AST: 31 U/L (ref 0–37)
Albumin: 4.6 g/dL (ref 3.5–5.2)
Alkaline Phosphatase: 52 U/L (ref 39–117)
BUN: 15 mg/dL (ref 6–23)
CO2: 26 mEq/L (ref 19–32)
Calcium: 9.6 mg/dL (ref 8.4–10.5)
Chloride: 103 mEq/L (ref 96–112)
Creatinine, Ser: 0.88 mg/dL (ref 0.40–1.50)
GFR: 95.3 mL/min (ref >60.00)
Glucose, Bld: 131 mg/dL — ABNORMAL HIGH (ref 70–99)
Potassium: 4.1 mEq/L (ref 3.5–5.1)
Sodium: 138 mEq/L (ref 135–145)
Total Bilirubin: 1.2 mg/dL (ref 0.2–1.2)
Total Protein: 7.3 g/dL (ref 6.0–8.3)

## 2022-07-11 LAB — LIPID PANEL
Cholesterol: 126 mg/dL (ref 0–200)
HDL: 37 mg/dL — ABNORMAL LOW (ref >39.00)
LDL Cholesterol: 74 mg/dL (ref 0–99)
NonHDL: 88.66
Total CHOL/HDL Ratio: 3
Triglycerides: 71 mg/dL (ref 0.0–149.0)
VLDL: 14.2 mg/dL (ref 0.0–40.0)

## 2022-07-11 LAB — HEMOGLOBIN A1C: Hgb A1c MFr Bld: 7.3 % — ABNORMAL HIGH (ref 4.6–6.5)

## 2022-07-11 NOTE — Assessment & Plan Note (Signed)
Keratinous lesions on lower leg. Can try applying Aldara cream as directed and he wil follow up with dermatology

## 2022-07-25 ENCOUNTER — Encounter: Payer: Self-pay | Admitting: Family Medicine

## 2022-07-26 ENCOUNTER — Other Ambulatory Visit: Payer: Self-pay

## 2022-07-26 MED ORDER — FAMOTIDINE 40 MG PO TABS: 40.0000 mg | ORAL_TABLET | Freq: Every day | ORAL | 3 refills | Status: DC

## 2022-07-26 MED ORDER — OMEPRAZOLE 40 MG PO CPDR: 40.0000 mg | DELAYED_RELEASE_CAPSULE | Freq: Every day | ORAL | 3 refills | Status: DC

## 2022-07-26 NOTE — Telephone Encounter (Signed)
Medication sent.

## 2022-07-31 ENCOUNTER — Ambulatory Visit: Payer: Commercial Managed Care - HMO

## 2022-08-07 ENCOUNTER — Ambulatory Visit (INDEPENDENT_AMBULATORY_CARE_PROVIDER_SITE_OTHER): Payer: Commercial Managed Care - HMO

## 2022-08-07 DIAGNOSIS — Z23 Encounter for immunization: Secondary | ICD-10-CM | POA: Diagnosis not present

## 2022-08-07 NOTE — Progress Notes (Signed)
Pt here for Influenza Shot    Pt did Well Right  Deltoid

## 2022-08-16 ENCOUNTER — Encounter: Payer: Self-pay | Admitting: Family Medicine

## 2022-09-13 ENCOUNTER — Ambulatory Visit: Payer: Commercial Managed Care - HMO

## 2022-09-19 ENCOUNTER — Other Ambulatory Visit: Payer: Self-pay | Admitting: Family Medicine

## 2022-09-19 DIAGNOSIS — E119 Type 2 diabetes mellitus without complications: Secondary | ICD-10-CM

## 2022-09-19 DIAGNOSIS — E785 Hyperlipidemia, unspecified: Secondary | ICD-10-CM

## 2022-09-19 DIAGNOSIS — I10 Essential (primary) hypertension: Secondary | ICD-10-CM

## 2022-10-01 ENCOUNTER — Encounter: Payer: Self-pay | Admitting: Family Medicine

## 2022-10-01 DIAGNOSIS — N529 Male erectile dysfunction, unspecified: Secondary | ICD-10-CM

## 2022-10-05 ENCOUNTER — Other Ambulatory Visit: Payer: Self-pay | Admitting: Family Medicine

## 2022-10-05 ENCOUNTER — Ambulatory Visit: Payer: Commercial Managed Care - HMO | Admitting: Podiatry

## 2022-10-05 DIAGNOSIS — L601 Onycholysis: Secondary | ICD-10-CM | POA: Diagnosis not present

## 2022-10-05 NOTE — Patient Instructions (Signed)
Choose a moisturizer from  the list below:  For normal skin: Moisturize feet once daily; do not apply between toes A.  CeraVe Daily Moisturizing Lotion B.  Lubriderm Advanced Therapy Lotion or Lubriderm Intense Skin Repair Lotion C.  Vaseline Intensive Care Lotion D.  Gold Bond Ultimate Diabetic Foot Lotion E.  Eucerin Intensive Repair Moisturizing Lotion  For extremely dry, cracked feet: moisturize feet once daily; do not apply between toes A. CeraVe Healing Ointment B. Eucerin Aquaphor Repairing Ointment (may be labeled Aquaphor Healing Ointment) C. Vaseline Petroleum Healing Jelly   If you have problems reaching your feet: apply to feet once daily; do not apply between toes A.  Eucerin Aquaphor Ointment Body Spray  B.  Vaseline Intensive Care Spray Moisturizer (Unscented,  Cocoa Radiant Spray or Aloe Smooth Spray)

## 2022-10-11 NOTE — Progress Notes (Signed)
Subjective: Chief Complaint  Patient presents with   Nail Problem    Left great toe , patient is worried nail is lifting off of nail bed . States he is a diabetic and wants to make sure the nail is ok   56 year old male presents the above complaints.  He was concerned that the left big toenail looked like it may be lifting.  Not causing any pain and he is not is any swelling or redness or any drainage but he wants to have the area checked he is diabetic and is concerned.  No open lesions.   Objective: AAO x3, NAD DP/PT pulses palpable bilaterally, CRT less than 3 seconds Left hallux toenail which appears to be well adhered to the nailbed.  There was some dried skin formation on the proximal nail fold which I debrided today there is no drainage or pus.  There is no edema, erythema or signs of infection. No pain with calf compression, swelling, warmth, erythema  Assessment: Concern for onycholysis  Plan: -All treatment options discussed with the patient including all alternatives, risks, complications.  -As for the nail partially from adhered.  There was some dried skin along the proximal nail fold.  We discussed tea tree oil and other topicals to help hydrate the nail bed.  Monitor for any signs or symptoms of infection. -Patient encouraged to call the office with any questions, concerns, change in symptoms.   Trula Slade DPM

## 2022-10-14 ENCOUNTER — Encounter: Payer: Self-pay | Admitting: Family Medicine

## 2022-10-15 ENCOUNTER — Encounter: Payer: Self-pay | Admitting: Family Medicine

## 2022-10-16 ENCOUNTER — Other Ambulatory Visit: Payer: Self-pay

## 2022-10-16 ENCOUNTER — Telehealth: Payer: Self-pay

## 2022-10-16 DIAGNOSIS — E119 Type 2 diabetes mellitus without complications: Secondary | ICD-10-CM

## 2022-10-16 DIAGNOSIS — E785 Hyperlipidemia, unspecified: Secondary | ICD-10-CM

## 2022-10-16 DIAGNOSIS — I10 Essential (primary) hypertension: Secondary | ICD-10-CM

## 2022-10-16 DIAGNOSIS — E7849 Other hyperlipidemia: Secondary | ICD-10-CM

## 2022-10-16 DIAGNOSIS — E1165 Type 2 diabetes mellitus with hyperglycemia: Secondary | ICD-10-CM

## 2022-10-16 MED ORDER — AZATHIOPRINE 50 MG PO TABS: ORAL_TABLET | ORAL | 1 refills | Status: DC

## 2022-10-16 MED ORDER — DEXCOM G6 TRANSMITTER MISC: 1 refills | Status: AC

## 2022-10-16 MED ORDER — FENOFIBRATE 160 MG PO TABS: ORAL_TABLET | ORAL | 3 refills | Status: DC

## 2022-10-16 MED ORDER — BALSALAZIDE DISODIUM 750 MG PO CAPS: ORAL_CAPSULE | ORAL | 3 refills | Status: DC

## 2022-10-16 MED ORDER — FAMOTIDINE 40 MG PO TABS: 40.0000 mg | ORAL_TABLET | Freq: Every day | ORAL | 6 refills | Status: DC

## 2022-10-16 MED ORDER — ROSUVASTATIN CALCIUM 5 MG PO TABS: ORAL_TABLET | ORAL | 1 refills | Status: DC

## 2022-10-16 MED ORDER — BASAGLAR KWIKPEN 100 UNIT/ML ~~LOC~~ SOPN: 50.0000 [IU] | PEN_INJECTOR | Freq: Every day | SUBCUTANEOUS | 1 refills | Status: DC

## 2022-10-16 MED ORDER — ALBUTEROL SULFATE HFA 108 (90 BASE) MCG/ACT IN AERS: 2.0000 | INHALATION_SPRAY | Freq: Four times a day (QID) | RESPIRATORY_TRACT | 0 refills | Status: DC | PRN

## 2022-10-16 MED ORDER — BD PEN NEEDLE NANO 2ND GEN 32G X 4 MM MISC: 11 refills | Status: DC

## 2022-10-16 MED ORDER — DEXCOM G6 SENSOR MISC: 0 refills | Status: AC

## 2022-10-16 NOTE — Telephone Encounter (Signed)
Medication sent.

## 2022-10-16 NOTE — Telephone Encounter (Signed)
PA initiated via Covermymeds; KEY: [id number]. Awaiting determination.

## 2022-10-17 ENCOUNTER — Encounter: Payer: Self-pay | Admitting: Urology

## 2022-10-17 ENCOUNTER — Ambulatory Visit: Payer: 59 | Admitting: Urology

## 2022-10-17 VITALS — BP 155/81 | HR 87 | Ht 66.0 in | Wt 168.0 lb

## 2022-10-17 DIAGNOSIS — N529 Male erectile dysfunction, unspecified: Secondary | ICD-10-CM | POA: Diagnosis not present

## 2022-10-17 DIAGNOSIS — Z125 Encounter for screening for malignant neoplasm of prostate: Secondary | ICD-10-CM | POA: Diagnosis not present

## 2022-10-17 MED ORDER — TADALAFIL 20 MG PO TABS: 20.0000 mg | ORAL_TABLET | Freq: Every day | ORAL | 11 refills | Status: DC | PRN

## 2022-10-17 NOTE — Telephone Encounter (Signed)
PA approved. Effective 10/17/2022 to 10/18/2023 

## 2022-10-17 NOTE — Progress Notes (Signed)
Assessment: 1. Organic impotence   2. Prostate cancer screening     Plan: I reviewed the patient's records including provider notes and lab results. I reviewed records from Dr. Dorothyann Peng regarding his prior urologic evaluation and treatment. Rx for tadalafil 20 mg prn  PSA with next lab draw Return to office in 1 year  Chief Complaint:  Chief Complaint  Patient presents with   Erectile Dysfunction    History of Present Illness:  Curtis Kennedy is a 57 y.o. male who is seen in consultation from Mosie Lukes, MD for evaluation of erectile dysfunction.  He has previously been managed by Dr. Dorothyann Peng in [place].  He has a 3-4-year history of erectile dysfunction.  He was initially managed with sildenafil with good results.  He noticed a decrease in the effectiveness of the sildenafil 100 mg.  He was changed to tadalafil 20 mg as needed.  He was last seen in April 2023.  He was given a trial of tadalafil 5 mg daily.  PSA from 6/22: 0.81   He present today to establish care given Dr. Sherry Ruffing departure from the area.  He continues to take tadalafil 5 mg daily.  He would like to resume using the tadalafil 20 mg as needed as he felt this worked better for him and does not want to take a medication daily.  He continues to have difficulty achieving and maintaining his erections without medication.  No pain or curvature with erection.  He is not having any lower urinary tract symptoms.  No dysuria or gross hematuria.  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 Erlene Quan; [facility]--has been in remission since about 2009   Diabetes mellitus without complication ([place]) 57 yrs old   type 2 (dx'd after long courses of prednisone for his UC)   Erectile dysfunction    Family history of tinea corporis 07/25/2012   Gastroesophageal reflux disease 04/05/2022   Hyperlipidemia    Hypertension     Insomnia    Low back pain 08/07/2016   Preventative health care 12/31/2016   Right shoulder pain 12/31/2016   Sinusitis 08/28/2015    Past Surgical History:  Past Surgical History:  Procedure Laterality Date   TRIGGER FINGER RELEASE  10/15/2009   b/l middle fingers    Allergies:  Allergies  Allergen Reactions   Penicillins Hives   Simvastatin     Myalgias    Family History:  Family History  Problem Relation Age of Onset   Diabetes Father    Hyperlipidemia Father    Hypertension Father    Kidney disease Father        Kidney Failure   Heart disease Father 57       MI   Hypertension Sister    Hypertension Brother    Diabetes Paternal Grandfather    Hypertension Brother    Hyperlipidemia Brother    Heart disease Brother        Valve Surgery ?   Diabetes Brother    Hypertension Brother    ADD / ADHD Brother     Social History:  Social History   Tobacco Use   Smoking status: Never   Smokeless tobacco: Never  Substance Use Topics   Alcohol use: Never   Drug use: Never    Review of symptoms:  Constitutional:  Negative for unexplained weight loss, night sweats, fever,  chills ENT:  Negative for nose bleeds, sinus pain, painful swallowing CV:  Negative for chest pain, shortness of breath, exercise intolerance, palpitations, loss of consciousness Resp:  Negative for cough, wheezing, shortness of breath GI:  Negative for nausea, vomiting, diarrhea, bloody stools GU:  Positives noted in HPI; otherwise negative for gross hematuria, dysuria, urinary incontinence Neuro:  Negative for seizures, poor balance, limb weakness, slurred speech Psych:  Negative for lack of energy, depression, anxiety Endocrine:  Negative for polydipsia, polyuria, symptoms of hypoglycemia (dizziness, hunger, sweating) Hematologic:  Negative for anemia, purpura, petechia, prolonged or excessive bleeding, use of anticoagulants  Allergic:  Negative for difficulty breathing or choking as a result  of exposure to anything; no shellfish allergy; no allergic response (rash/itch) to materials, foods  Physical exam: BP (!) 155/81   Pulse 87   Ht 5\' 6"  (1.676 m)   Wt 168 lb (76.2 kg)   BMI 27.12 kg/m  GENERAL APPEARANCE:  Well appearing, well developed, well nourished, NAD HEENT: Atraumatic, Normocephalic, oropharynx clear. NECK: Supple without lymphadenopathy or thyromegaly. LUNGS: Clear to auscultation bilaterally. HEART: Regular Rate and Rhythm without murmurs, gallops, or rubs. ABDOMEN: Soft, non-tender, No Masses. EXTREMITIES: Moves all extremities well.  Without clubbing, cyanosis, or edema. NEUROLOGIC:  Alert and oriented x 3, normal gait, CN II-XII grossly intact.  MENTAL STATUS:  Appropriate. BACK:  Non-tender to palpation.  No CVAT SKIN:  Warm, dry and intact.    Results: None

## 2022-10-26 ENCOUNTER — Encounter: Payer: Self-pay | Admitting: Family Medicine

## 2022-10-26 ENCOUNTER — Other Ambulatory Visit: Payer: Self-pay

## 2022-10-26 MED ORDER — MONTELUKAST SODIUM 10 MG PO TABS: 10.0000 mg | ORAL_TABLET | Freq: Every day | ORAL | 1 refills | Status: DC

## 2022-10-26 MED ORDER — TRULICITY 3 MG/0.5ML ~~LOC~~ SOAJ: SUBCUTANEOUS | 1 refills | Status: DC

## 2022-10-31 DIAGNOSIS — M9904 Segmental and somatic dysfunction of sacral region: Secondary | ICD-10-CM | POA: Diagnosis not present

## 2022-10-31 DIAGNOSIS — M9901 Segmental and somatic dysfunction of cervical region: Secondary | ICD-10-CM | POA: Diagnosis not present

## 2022-10-31 DIAGNOSIS — M9903 Segmental and somatic dysfunction of lumbar region: Secondary | ICD-10-CM | POA: Diagnosis not present

## 2022-10-31 DIAGNOSIS — M545 Low back pain, unspecified: Secondary | ICD-10-CM | POA: Diagnosis not present

## 2022-10-31 DIAGNOSIS — M9902 Segmental and somatic dysfunction of thoracic region: Secondary | ICD-10-CM | POA: Diagnosis not present

## 2022-11-12 ENCOUNTER — Other Ambulatory Visit: Payer: Self-pay | Admitting: Family Medicine

## 2022-11-12 ENCOUNTER — Ambulatory Visit: Payer: Commercial Managed Care - HMO | Admitting: Family Medicine

## 2022-11-17 ENCOUNTER — Other Ambulatory Visit: Payer: Self-pay | Admitting: Family Medicine

## 2022-12-03 ENCOUNTER — Other Ambulatory Visit: Payer: Commercial Managed Care - HMO

## 2022-12-09 NOTE — Assessment & Plan Note (Signed)
Supplement and monitor 

## 2022-12-09 NOTE — Assessment & Plan Note (Signed)
hgba1c acceptable, minimize simple carbs. Increase exercise as tolerated. Continue current meds 

## 2022-12-09 NOTE — Assessment & Plan Note (Signed)
Well controlled, no changes to meds. Encouraged heart healthy diet such as the DASH diet and exercise as tolerated.  °

## 2022-12-09 NOTE — Assessment & Plan Note (Signed)
Tolerating statin, encouraged heart healthy diet, avoid trans fats, minimize simple carbs and saturated fats. Increase exercise as tolerated 

## 2022-12-10 ENCOUNTER — Encounter: Payer: Self-pay | Admitting: Family Medicine

## 2022-12-10 ENCOUNTER — Ambulatory Visit (INDEPENDENT_AMBULATORY_CARE_PROVIDER_SITE_OTHER): Payer: 59 | Admitting: Family Medicine

## 2022-12-10 VITALS — BP 130/70 | HR 103 | Temp 98.0°F | Resp 16 | Ht 66.0 in | Wt 179.6 lb

## 2022-12-10 DIAGNOSIS — E1165 Type 2 diabetes mellitus with hyperglycemia: Secondary | ICD-10-CM | POA: Diagnosis not present

## 2022-12-10 DIAGNOSIS — I10 Essential (primary) hypertension: Secondary | ICD-10-CM | POA: Diagnosis not present

## 2022-12-10 DIAGNOSIS — R351 Nocturia: Secondary | ICD-10-CM

## 2022-12-10 DIAGNOSIS — M1990 Unspecified osteoarthritis, unspecified site: Secondary | ICD-10-CM | POA: Insufficient documentation

## 2022-12-10 DIAGNOSIS — E782 Mixed hyperlipidemia: Secondary | ICD-10-CM

## 2022-12-10 DIAGNOSIS — D649 Anemia, unspecified: Secondary | ICD-10-CM

## 2022-12-10 DIAGNOSIS — K219 Gastro-esophageal reflux disease without esophagitis: Secondary | ICD-10-CM

## 2022-12-10 HISTORY — DX: Unspecified osteoarthritis, unspecified site: M19.90

## 2022-12-10 NOTE — Assessment & Plan Note (Signed)
Bilateral great toe pain when he walks it hurts and then resolves. Try Voltaren gel and stay active

## 2022-12-10 NOTE — Patient Instructions (Addendum)
Magnesium glycinate 200-400 mg at bedtime Breathing exercises  Carbohydrate Counting for Diabetes Mellitus, Adult Carbohydrate counting is a method of keeping track of how many carbohydrates you eat. Eating carbohydrates increases the amount of sugar (glucose) in the blood. Counting how many carbohydrates you eat improves how well you manage your blood glucose. This, in turn, helps you manage your diabetes. Carbohydrates are measured in grams (g) per serving. It is important to know how many carbohydrates (in grams or by serving size) you can have in each meal. This is different for every person. A dietitian can help you make a meal plan and calculate how many carbohydrates you should have at each meal and snack. What foods contain carbohydrates? Carbohydrates are found in the following foods: Grains, such as breads and cereals. Dried beans and soy products. Starchy vegetables, such as potatoes, peas, and corn. Fruit and fruit juices. Milk and yogurt. Sweets and snack foods, such as cake, cookies, candy, chips, and soft drinks. How do I count carbohydrates in foods? There are two ways to count carbohydrates in food. You can read food labels or learn standard serving sizes of foods. You can use either of these methods or a combination of both. Using the Nutrition Facts label The Nutrition Facts list is included on the labels of almost all packaged foods and beverages in the [place]. It includes: The serving size. Information about nutrients in each serving, including the grams of carbohydrate per serving. To use the Nutrition Facts, decide how many servings you will have. Then, multiply the number of servings by the number of carbohydrates per serving. The resulting number is the total grams of carbohydrates that you will be having. Learning the standard serving sizes of foods When you eat carbohydrate foods that are not packaged or do not include Nutrition Facts on the label, you need to  measure the servings in order to count the grams of carbohydrates. Measure the foods that you will eat with a food scale or measuring cup, if needed. Decide how many standard-size servings you will eat. Multiply the number of servings by 15. For foods that contain carbohydrates, one serving equals 15 g of carbohydrates. For example, if you eat 2 cups or 10 oz (300 g) of strawberries, you will have eaten 2 servings and 30 g of carbohydrates (2 servings x 15 g = 30 g). For foods that have more than one food mixed, such as soups and casseroles, you must count the carbohydrates in each food that is included. The following list contains standard serving sizes of common carbohydrate-rich foods. Each of these servings has about 15 g of carbohydrates: 1 slice of bread. 1 six-inch (15 cm) tortilla. ? cup or 2 oz (53 g) cooked rice or pasta.  cup or 3 oz (85 g) cooked or canned, drained and rinsed beans or lentils.  cup or 3 oz (85 g) starchy vegetable, such as peas, corn, or squash.  cup or 4 oz (120 g) hot cereal.  cup or 3 oz (85 g) boiled or mashed potatoes, or  or 3 oz (85 g) of a large baked potato.  cup or 4 fl oz (118 mL) fruit juice. 1 cup or 8 fl oz (237 mL) milk. 1 small or 4 oz (106 g) apple.  or 2 oz (63 g) of a medium banana. 1 cup or 5 oz (150 g) strawberries. 3 cups or 1 oz (28.3 g) popped popcorn. What is an example of carbohydrate counting? To calculate the grams  of carbohydrates in this sample meal, follow the steps shown below. Sample meal 3 oz (85 g) chicken breast. ? cup or 4 oz (106 g) brown rice.  cup or 3 oz (85 g) corn. 1 cup or 8 fl oz (237 mL) milk. 1 cup or 5 oz (150 g) strawberries with sugar-free whipped topping. Carbohydrate calculation Identify the foods that contain carbohydrates: Rice. Corn. Milk. Strawberries. Calculate how many servings you have of each food: 2 servings rice. 1 serving corn. 1 serving milk. 1 serving strawberries. Multiply  each number of servings by 15 g: 2 servings rice x 15 g = 30 g. 1 serving corn x 15 g = 15 g. 1 serving milk x 15 g = 15 g. 1 serving strawberries x 15 g = 15 g. Add together all of the amounts to find the total grams of carbohydrates eaten: 30 g + 15 g + 15 g + 15 g = 75 g of carbohydrates total. What are tips for following this plan? Shopping Develop a meal plan and then make a shopping list. Buy fresh and frozen vegetables, fresh and frozen fruit, dairy, eggs, beans, lentils, and whole grains. Look at food labels. Choose foods that have more fiber and less sugar. Avoid processed foods and foods with added sugars. Meal planning Aim to have the same number of grams of carbohydrates at each meal and for each snack time. Plan to have regular, balanced meals and snacks. Where to find more information American Diabetes Association: diabetes.org Centers for Disease Control and Prevention: StoreMirror.com.[place] Academy of Nutrition and Dietetics: eatright.org Association of Diabetes Care & Education Specialists: diabeteseducator.org Summary Carbohydrate counting is a method of keeping track of how many carbohydrates you eat. Eating carbohydrates increases the amount of sugar (glucose) in your blood. Counting how many carbohydrates you eat improves how well you manage your blood glucose. This helps you manage your diabetes. A dietitian can help you make a meal plan and calculate how many carbohydrates you should have at each meal and snack. This information is not intended to replace advice given to you by your health care provider. Make sure you discuss any questions you have with your health care provider. Document Revised: 05/04/2020 Document Reviewed: 05/04/2020 Elsevier Patient Education  [place].

## 2022-12-10 NOTE — Assessment & Plan Note (Signed)
Tried to stop the Omeprazole but his heartburn came back. He is now on Famotidine qhs and Omeprazole in am and it controls the symptoms. Avoid offending foods, start probiotics. Do not eat large meals in late evening and consider raising head of bed.

## 2022-12-11 DIAGNOSIS — M9901 Segmental and somatic dysfunction of cervical region: Secondary | ICD-10-CM | POA: Diagnosis not present

## 2022-12-11 DIAGNOSIS — M545 Low back pain, unspecified: Secondary | ICD-10-CM | POA: Diagnosis not present

## 2022-12-11 DIAGNOSIS — M9903 Segmental and somatic dysfunction of lumbar region: Secondary | ICD-10-CM | POA: Diagnosis not present

## 2022-12-11 DIAGNOSIS — M9904 Segmental and somatic dysfunction of sacral region: Secondary | ICD-10-CM | POA: Diagnosis not present

## 2022-12-11 DIAGNOSIS — M9902 Segmental and somatic dysfunction of thoracic region: Secondary | ICD-10-CM | POA: Diagnosis not present

## 2022-12-11 LAB — MICROALBUMIN / CREATININE URINE RATIO
Creatinine,U: 90.7 mg/dL
Microalb Creat Ratio: 29 mg/g (ref 0.0–30.0)
Microalb, Ur: 26.3 mg/dL — ABNORMAL HIGH (ref 0.0–1.9)

## 2022-12-11 LAB — LIPID PANEL
Cholesterol: 127 mg/dL (ref 0–200)
HDL: 39.3 mg/dL (ref >39.00)
LDL Cholesterol: 57 mg/dL (ref 0–99)
NonHDL: 87.89
Total CHOL/HDL Ratio: 3
Triglycerides: 156 mg/dL — ABNORMAL HIGH (ref 0.0–149.0)
VLDL: 31.2 mg/dL (ref 0.0–40.0)

## 2022-12-11 LAB — COMPREHENSIVE METABOLIC PANEL
ALT: 48 U/L (ref 0–53)
AST: 26 U/L (ref 0–37)
Albumin: 4.4 g/dL (ref 3.5–5.2)
Alkaline Phosphatase: 77 U/L (ref 39–117)
BUN: 14 mg/dL (ref 6–23)
CO2: 25 mEq/L (ref 19–32)
Calcium: 9.8 mg/dL (ref 8.4–10.5)
Chloride: 103 mEq/L (ref 96–112)
Creatinine, Ser: 0.92 mg/dL (ref 0.40–1.50)
GFR: 91.92 mL/min (ref >60.00)
Glucose, Bld: 270 mg/dL — ABNORMAL HIGH (ref 70–99)
Potassium: 4.5 mEq/L (ref 3.5–5.1)
Sodium: 137 mEq/L (ref 135–145)
Total Bilirubin: 0.8 mg/dL (ref 0.2–1.2)
Total Protein: 7.1 g/dL (ref 6.0–8.3)

## 2022-12-11 LAB — CBC WITH DIFFERENTIAL/PLATELET
Basophils Absolute: 0 10*3/uL (ref 0.0–0.1)
Basophils Relative: 1 % (ref 0.0–3.0)
Eosinophils Absolute: 0.1 10*3/uL (ref 0.0–0.7)
Eosinophils Relative: 1.6 % (ref 0.0–5.0)
HCT: 37.2 % — ABNORMAL LOW (ref 39.0–52.0)
Hemoglobin: 13.3 g/dL (ref 13.0–17.0)
Lymphocytes Relative: 19.5 % (ref 12.0–46.0)
Lymphs Abs: 1 10*3/uL (ref 0.7–4.0)
MCHC: 35.9 g/dL (ref 30.0–36.0)
MCV: 96.7 fl (ref 78.0–100.0)
Monocytes Absolute: 0.3 10*3/uL (ref 0.1–1.0)
Monocytes Relative: 6.7 % (ref 3.0–12.0)
Neutro Abs: 3.5 10*3/uL (ref 1.4–7.7)
Neutrophils Relative %: 71.2 % (ref 43.0–77.0)
Platelets: 280 10*3/uL (ref 150.0–400.0)
RBC: 3.84 Mil/uL — ABNORMAL LOW (ref 4.22–5.81)
RDW: 14.5 % (ref 11.5–15.5)
WBC: 4.9 10*3/uL (ref 4.0–10.5)

## 2022-12-11 LAB — HEMOGLOBIN A1C: Hgb A1c MFr Bld: 8 % — ABNORMAL HIGH (ref 4.6–6.5)

## 2022-12-11 LAB — PSA: PSA: 1.05 ng/mL (ref 0.10–4.00)

## 2022-12-11 LAB — TSH: TSH: 1.1 u[IU]/mL (ref 0.35–5.50)

## 2022-12-12 NOTE — Progress Notes (Signed)
Subjective:    Patient ID: Curtis Kennedy, male    DOB: 1966/04/20, 57 y.o.   MRN: [id number]  Chief Complaint  Patient presents with   Follow-up    Follow up    HPI Patient is in today for follow up on chronic medical concerns. No recent febrile illness or hospitalizations. Denies CP/palp/SOB/HA/congestion/fevers/GI or GU c/o. Taking meds as prescribed. He has recently come back from several vacations and acknowledges he has been eating badly and not checking his sugars. Denies CP/palp/SOB/HA/congestion/fevers/GI or GU c/o. 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 Erlene Quan; [facility]--has been in remission since about 2009   Diabetes mellitus without complication ([place]) 57 yrs old   type 2 (dx'd after long courses of prednisone for his UC)   Erectile dysfunction    Family history of tinea corporis 07/25/2012   Gastroesophageal reflux disease 04/05/2022   Hyperlipidemia    Hypertension    Insomnia    Low back pain 08/07/2016   Preventative health care 12/31/2016   Right shoulder pain 12/31/2016   Sinusitis 08/28/2015    Past Surgical History:  Procedure Laterality Date   TRIGGER FINGER RELEASE  10/15/2009   b/l middle fingers    Family History  Problem Relation Age of Onset   Diabetes Father    Hyperlipidemia Father    Hypertension Father    Kidney disease Father        Kidney Failure   Heart disease Father 36       MI   Hypertension Sister    Hypertension Brother    Diabetes Paternal Grandfather    Hypertension Brother    Hyperlipidemia Brother    Heart disease Brother        Valve Surgery ?   Diabetes Brother    Hypertension Brother    ADD / ADHD Brother     Social History   Socioeconomic History   Marital status: Married    Spouse name: Not on file   Number of children: Not on file   Years of education: Not on file   Highest  education level: Not on file  Occupational History   Not on file  Tobacco Use   Smoking status: Never   Smokeless tobacco: Never  Substance and Sexual Activity   Alcohol use: Never   Drug use: Never   Sexual activity: Yes    Partners: Female    Birth control/protection: None    Comment: lives with wife, does office work follows with  Other Topics Concern   Not on file  Social History Narrative   Married, 59 y/o girl.     Lives in [place], grew up in [place]--relocated to [facility] 2007.   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         Social Determinants of Health   Financial Resource Strain: Not on file  Food Insecurity: Not on file  Transportation Needs: Not on file  Physical Activity: Not on file  Stress: Not on file  Social Connections: Not on file  Intimate Partner Violence: Not on file    Outpatient Medications Prior to Visit  Medication Sig Dispense Refill   albuterol (VENTOLIN HFA) 108 (90 Base) MCG/ACT inhaler TAKE 2 PUFFS BY MOUTH EVERY 6 HOURS AS NEEDED  FOR WHEEZE OR SHORTNESS OF BREATH 18 each 2   amitriptyline (ELAVIL) 10 MG tablet TAKE 1/2 TO 2 TABLETS(5 TO 20 MG) BY MOUTH AT BEDTIME AS NEEDED FOR SLEEP 40 tablet 1   azaTHIOprine (IMURAN) 50 MG tablet TAKE 4.5 TABLETS BY MOUTH EVERY DAY 405 tablet 1   balsalazide (COLAZAL) 750 MG capsule TAKE 3 CAPSULES(2250 MG) BY MOUTH THREE TIMES DAILY 270 capsule 3   Continuous Blood Gluc Receiver (DEXCOM G6 RECEIVER) DEVI Use for continuous blood glucose monitoring 1 each 0   Continuous Blood Gluc Sensor (DEXCOM G6 SENSOR) MISC Use for continuous blood glucose monitoring.  Replace every 10 days. 4 each 0   Continuous Blood Gluc Transmit (DEXCOM G6 TRANSMITTER) MISC Use for continuous blood glucose monitoring.  Replace every 90 days 1 each 1   Dulaglutide (TRULICITY) 3 0000000 SOPN INJECT '3MG'$  UNDER THE SKIN EVERY WEEK 6 mL 1   famotidine (PEPCID) 40 MG  tablet Take 1 tablet (40 mg total) by mouth daily. 30 tablet 6   fenofibrate 160 MG tablet TAKE 1 TABLET(160 MG) BY MOUTH DAILY 90 tablet 3   fluticasone (FLONASE) 50 MCG/ACT nasal spray Place 2 sprays into both nostrils daily. 16 g 5   fluticasone (FLOVENT HFA) 110 MCG/ACT inhaler Inhale 2 puffs into the lungs 2 (two) times daily. 1 Inhaler 2   imiquimod (ALDARA) 5 % cream Apply topically 3 (three) times a week. 12 each 0   Insulin Glargine (BASAGLAR KWIKPEN) 100 UNIT/ML Inject 50 Units into the skin daily. 45 mL 1   Insulin Pen Needle (BD PEN NEEDLE NANO 2ND GEN) 32G X 4 MM MISC USE 1 NEEDLE DAILY AS DIRECTED 100 each 11   Lancets (ONETOUCH DELICA PLUS Q000111Q) MISC USE AS DIRECTED TWICE DAILY TO CHECK BLOOD SUGAR 100 each 0   levocetirizine (XYZAL) 5 MG tablet TAKE 1 TABLET(5 MG) BY MOUTH EVERY EVENING 90 tablet 3   losartan (COZAAR) 100 MG tablet TAKE 1 TABLET(100 MG) BY MOUTH DAILY 90 tablet 1   meloxicam (MOBIC) 15 MG tablet Take 15 mg by mouth daily.     metFORMIN (GLUCOPHAGE) 1000 MG tablet Take 1 tablet (1,000 mg total) by mouth 2 (two) times daily with a meal. 180 tablet 1   methocarbamol (ROBAXIN) 500 MG tablet Take 1 tablet (500 mg total) by mouth every 6 (six) hours as needed for muscle spasms. 60 tablet 2   montelukast (SINGULAIR) 10 MG tablet Take 1 tablet (10 mg total) by mouth at bedtime. 90 tablet 1   olopatadine (PATANOL) 0.1 % ophthalmic solution Place 1 drop into both eyes 2 (two) times daily. 5 mL 3   omeprazole (PRILOSEC) 40 MG capsule TAKE 1 CAPSULE(40 MG) BY MOUTH DAILY 30 capsule 3   rosuvastatin (CRESTOR) 5 MG tablet TAKE 1 TABLET(5 MG) BY MOUTH DAILY 90 tablet 1   tadalafil (CIALIS) 20 MG tablet Take 1 tablet (20 mg total) by mouth daily as needed for erectile dysfunction. 30 tablet 11   traMADol (ULTRAM) 50 MG tablet Take 1 tablet (50 mg total) by mouth every 8 (eight) hours as needed. 60 tablet 0   valACYclovir (VALTREX) 1000 MG tablet Take 1 tablet (1,000 mg total)  by mouth 2 (two) times daily as needed. 60 tablet 3   zaleplon (SONATA) 10 MG capsule Take 1 capsule (10 mg total) by mouth at bedtime as needed for sleep. 30 capsule 2   No facility-administered medications prior to visit.    Allergies  Allergen Reactions  Penicillins Hives   Simvastatin     Myalgias    Review of Systems  Constitutional:  Negative for fever and malaise/fatigue.  HENT:  Negative for congestion.   Eyes:  Negative for blurred vision.  Respiratory:  Negative for shortness of breath.   Cardiovascular:  Negative for chest pain, palpitations and leg swelling.  Gastrointestinal:  Negative for abdominal pain, blood in stool and nausea.  Genitourinary:  Negative for dysuria and frequency.  Musculoskeletal:  Negative for falls.  Skin:  Negative for rash.  Neurological:  Negative for dizziness, loss of consciousness and headaches.  Endo/Heme/Allergies:  Negative for environmental allergies.  Psychiatric/Behavioral:  Negative for depression. The patient is not nervous/anxious.        Objective:    Physical Exam Constitutional:      General: He is not in acute distress.    Appearance: Normal appearance. He is not ill-appearing or toxic-appearing.  HENT:     Head: Normocephalic and atraumatic.     Right Ear: External ear normal.     Left Ear: External ear normal.     Nose: Nose normal.     Mouth/Throat:     Mouth: Mucous membranes are moist.  Eyes:     General:        Right eye: No discharge.        Left eye: No discharge.  Cardiovascular:     Heart sounds: No murmur heard. Pulmonary:     Effort: Pulmonary effort is normal.     Breath sounds: No wheezing.  Abdominal:     General: Abdomen is flat. There is no distension.  Skin:    Findings: No rash.  Neurological:     Mental Status: He is alert and oriented to person, place, and time.  Psychiatric:        Behavior: Behavior normal.     BP 130/70 (BP Location: Right Arm, Patient Position: Sitting, Cuff  Size: Normal)   Pulse (!) 103   Temp 98 F (36.7 C) (Oral)   Resp 16   Ht '5\' 6"'$  (1.676 m)   Wt 179 lb 9.6 oz (81.5 kg)   SpO2 96%   BMI 28.99 kg/m  Wt Readings from Last 3 Encounters:  12/10/22 179 lb 9.6 oz (81.5 kg)  10/17/22 168 lb (76.2 kg)  07/10/22 176 lb (79.8 kg)    Diabetic Foot Exam - Simple   No data filed    Lab Results  Component Value Date   WBC 4.9 12/10/2022   HGB 13.3 12/10/2022   HCT 37.2 (L) 12/10/2022   PLT 280.0 12/10/2022   GLUCOSE 270 (H) 12/10/2022   CHOL 127 12/10/2022   TRIG 156.0 (H) 12/10/2022   HDL 39.30 12/10/2022   LDLCALC 57 12/10/2022   ALT 48 12/10/2022   AST 26 12/10/2022   NA 137 12/10/2022   K 4.5 12/10/2022   CL 103 12/10/2022   CREATININE 0.92 12/10/2022   BUN 14 12/10/2022   CO2 25 12/10/2022   TSH 1.10 12/10/2022   PSA 1.05 12/10/2022   HGBA1C 8.0 (H) 12/10/2022   MICROALBUR 26.3 (H) 12/10/2022    Lab Results  Component Value Date   TSH 1.10 12/10/2022   Lab Results  Component Value Date   WBC 4.9 12/10/2022   HGB 13.3 12/10/2022   HCT 37.2 (L) 12/10/2022   MCV 96.7 12/10/2022   PLT 280.0 12/10/2022   Lab Results  Component Value Date   NA 137 12/10/2022   K 4.5 12/10/2022  CO2 25 12/10/2022   GLUCOSE 270 (H) 12/10/2022   BUN 14 12/10/2022   CREATININE 0.92 12/10/2022   BILITOT 0.8 12/10/2022   ALKPHOS 77 12/10/2022   AST 26 12/10/2022   ALT 48 12/10/2022   PROT 7.1 12/10/2022   ALBUMIN 4.4 12/10/2022   CALCIUM 9.8 12/10/2022   GFR 91.92 12/10/2022   Lab Results  Component Value Date   CHOL 127 12/10/2022   Lab Results  Component Value Date   HDL 39.30 12/10/2022   Lab Results  Component Value Date   LDLCALC 57 12/10/2022   Lab Results  Component Value Date   TRIG 156.0 (H) 12/10/2022   Lab Results  Component Value Date   CHOLHDL 3 12/10/2022   Lab Results  Component Value Date   HGBA1C 8.0 (H) 12/10/2022       Assessment & Plan:  Mixed hyperlipidemia Assessment &  Plan: Tolerating statin, encouraged heart healthy diet, avoid trans fats, minimize simple carbs and saturated fats. Increase exercise as tolerated  Orders: -     Lipid panel  Primary hypertension Assessment & Plan: Well controlled, no changes to meds. Encouraged heart healthy diet such as the DASH diet and exercise as tolerated.   Orders: -     CBC with Differential/Platelet -     Comprehensive metabolic panel -     TSH  Type 2 diabetes mellitus with hyperglycemia, unspecified whether long term insulin use (HCC) Assessment & Plan: hgba1c acceptable, minimize simple carbs. Increase exercise as tolerated. Continue current meds  Orders: -     Hemoglobin A1c -     Microalbumin / creatinine urine ratio  Anemia, unspecified type Assessment & Plan: Supplement and monitor    Gastroesophageal reflux disease, unspecified whether esophagitis present Assessment & Plan: Tried to stop the Omeprazole but his heartburn came back. He is now on Famotidine qhs and Omeprazole in am and it controls the symptoms. Avoid offending foods, start probiotics. Do not eat large meals in late evening and consider raising head of bed.     Arthritis Assessment & Plan: Bilateral great toe pain when he walks it hurts and then resolves. Try Voltaren gel and stay active   Nocturia -     PSA    Penni Homans, MD

## 2023-01-06 ENCOUNTER — Other Ambulatory Visit: Payer: Self-pay | Admitting: Family Medicine

## 2023-01-08 DIAGNOSIS — M9904 Segmental and somatic dysfunction of sacral region: Secondary | ICD-10-CM | POA: Diagnosis not present

## 2023-01-08 DIAGNOSIS — M9903 Segmental and somatic dysfunction of lumbar region: Secondary | ICD-10-CM | POA: Diagnosis not present

## 2023-01-08 DIAGNOSIS — M9902 Segmental and somatic dysfunction of thoracic region: Secondary | ICD-10-CM | POA: Diagnosis not present

## 2023-01-08 DIAGNOSIS — M545 Low back pain, unspecified: Secondary | ICD-10-CM | POA: Diagnosis not present

## 2023-01-08 DIAGNOSIS — M9901 Segmental and somatic dysfunction of cervical region: Secondary | ICD-10-CM | POA: Diagnosis not present

## 2023-02-01 ENCOUNTER — Other Ambulatory Visit: Payer: Self-pay | Admitting: Family Medicine

## 2023-02-01 DIAGNOSIS — E119 Type 2 diabetes mellitus without complications: Secondary | ICD-10-CM

## 2023-02-01 DIAGNOSIS — E785 Hyperlipidemia, unspecified: Secondary | ICD-10-CM

## 2023-02-01 DIAGNOSIS — I10 Essential (primary) hypertension: Secondary | ICD-10-CM

## 2023-02-01 DIAGNOSIS — E7849 Other hyperlipidemia: Secondary | ICD-10-CM

## 2023-02-28 ENCOUNTER — Other Ambulatory Visit: Payer: Self-pay | Admitting: Family Medicine

## 2023-02-28 DIAGNOSIS — I10 Essential (primary) hypertension: Secondary | ICD-10-CM

## 2023-02-28 DIAGNOSIS — E119 Type 2 diabetes mellitus without complications: Secondary | ICD-10-CM

## 2023-02-28 DIAGNOSIS — E785 Hyperlipidemia, unspecified: Secondary | ICD-10-CM

## 2023-03-05 DIAGNOSIS — M9903 Segmental and somatic dysfunction of lumbar region: Secondary | ICD-10-CM | POA: Diagnosis not present

## 2023-03-05 DIAGNOSIS — M545 Low back pain, unspecified: Secondary | ICD-10-CM | POA: Diagnosis not present

## 2023-03-05 DIAGNOSIS — M9902 Segmental and somatic dysfunction of thoracic region: Secondary | ICD-10-CM | POA: Diagnosis not present

## 2023-03-05 DIAGNOSIS — M9901 Segmental and somatic dysfunction of cervical region: Secondary | ICD-10-CM | POA: Diagnosis not present

## 2023-03-05 DIAGNOSIS — M9904 Segmental and somatic dysfunction of sacral region: Secondary | ICD-10-CM | POA: Diagnosis not present

## 2023-03-06 DIAGNOSIS — L820 Inflamed seborrheic keratosis: Secondary | ICD-10-CM | POA: Diagnosis not present

## 2023-03-06 DIAGNOSIS — L821 Other seborrheic keratosis: Secondary | ICD-10-CM | POA: Diagnosis not present

## 2023-03-06 DIAGNOSIS — L720 Epidermal cyst: Secondary | ICD-10-CM | POA: Diagnosis not present

## 2023-03-11 NOTE — Assessment & Plan Note (Signed)
Well controlled, no changes to meds. Encouraged heart healthy diet such as the DASH diet and exercise as tolerated.  °

## 2023-03-11 NOTE — Assessment & Plan Note (Signed)
Encouraged DASH or MIND diet, decrease po intake and increase exercise as tolerated. Needs 7-8 hours of sleep nightly. Avoid trans fats, eat small, frequent meals every 4-5 hours with lean proteins, complex carbs and healthy fats. Minimize simple carbs, high fat foods and processed foods 

## 2023-03-11 NOTE — Assessment & Plan Note (Signed)
Hgba1ccontinue to monitor, minimize simple carbs. Increase exercise as tolerated. Continue current meds

## 2023-03-11 NOTE — Assessment & Plan Note (Signed)
Tolerating statin, encouraged heart healthy diet, avoid trans fats, minimize simple carbs and saturated fats. Increase exercise as tolerated 

## 2023-03-12 ENCOUNTER — Ambulatory Visit (INDEPENDENT_AMBULATORY_CARE_PROVIDER_SITE_OTHER): Payer: 59 | Admitting: Family Medicine

## 2023-03-12 VITALS — BP 132/70 | HR 82 | Ht 66.0 in | Wt 178.0 lb

## 2023-03-12 DIAGNOSIS — K760 Fatty (change of) liver, not elsewhere classified: Secondary | ICD-10-CM

## 2023-03-12 DIAGNOSIS — E782 Mixed hyperlipidemia: Secondary | ICD-10-CM

## 2023-03-12 DIAGNOSIS — Z7985 Long-term (current) use of injectable non-insulin antidiabetic drugs: Secondary | ICD-10-CM

## 2023-03-12 DIAGNOSIS — I10 Essential (primary) hypertension: Secondary | ICD-10-CM

## 2023-03-12 DIAGNOSIS — E1165 Type 2 diabetes mellitus with hyperglycemia: Secondary | ICD-10-CM

## 2023-03-12 DIAGNOSIS — M7061 Trochanteric bursitis, right hip: Secondary | ICD-10-CM

## 2023-03-12 DIAGNOSIS — M7062 Trochanteric bursitis, left hip: Secondary | ICD-10-CM

## 2023-03-12 DIAGNOSIS — R252 Cramp and spasm: Secondary | ICD-10-CM

## 2023-03-12 MED ORDER — FREESTYLE LIBRE 3 SENSOR MISC: 0 refills | Status: AC

## 2023-03-12 NOTE — Progress Notes (Signed)
Subjective:   By signing my name below, I, Curtis Kennedy, attest that this documentation has been prepared under the direction and in the presence of Curtis Canary, MD., 03/12/2023.   Patient ID: Curtis Kennedy, male    DOB: 1966/08/24, 57 y.o.   MRN: [id number]  No chief complaint on file.  HPI Patient is in today for an office visit and is accompanied by his wife. He denies recent hospitalization, febrile illness, CP/palpitations/SOB/HA/fever/chills/GI or GU symptoms.  Bilateral Hip Pain Patient reports that he experiences bilateral hip pain that worsens upon exertion. He currently takes Tylenol as needed which has been helpful.  Hypertension Patient currently takes Losartan 100 mg which has been tolerable. However, he is in requesting a referral to cardiology due to his at-home pulse and and blood pressure remaining elevated. He also has family history of heart disease. BP Readings from Last 3 Encounters:  03/12/23 132/70  12/10/22 130/70  10/17/22 (!) 155/81   Pulse Readings from Last 3 Encounters:  03/12/23 82  12/10/22 (!) 103  10/17/22 87   Muscle Cramps Patient has been taking magnesium glycinate nightly to manage muscle cramps and help him sleep, which has been helpful.  Type 2 Diabetes Mellitus Patient continues injecting Basaglar 50 units daily and Trulicity 3 mg/0.5 mL once weekly which have been tolerable. He also uses Freestyle libre sensors to monitor his blood glucose. Lab Results  Component Value Date   HGBA1C 8.0 (H) 12/10/2022   Wt Readings from Last 3 Encounters:  03/12/23 178 lb (80.7 kg)  12/10/22 179 lb 9.6 oz (81.5 kg)  10/17/22 168 lb (76.2 kg)   Past Medical History:  Diagnosis Date   Allergy    Anemia 07/02/2017   Arthritis 12/10/2022   Colitis, ulcerative (HCC) Dx'd 2001   Dr. Rhetta Mura; [facility]--has been in remission since about 2009   Diabetes mellitus without complication (HCC) 57 yrs old   type 2 (dx'd after long courses of  prednisone for his UC)   Erectile dysfunction    Family history of tinea corporis 07/25/2012   Gastroesophageal reflux disease 04/05/2022   Hyperlipidemia    Hypertension    Insomnia    Low back pain 08/07/2016   Preventative health care 12/31/2016   Right shoulder pain 12/31/2016   Sinusitis 08/28/2015    Past Surgical History:  Procedure Laterality Date   TRIGGER FINGER RELEASE  10/15/2009   b/l middle fingers    Family History  Problem Relation Age of Onset   Diabetes Father    Hyperlipidemia Father    Hypertension Father    Kidney disease Father        Kidney Failure   Heart disease Father 29       MI   Hypertension Sister    Hypertension Brother    Diabetes Paternal Grandfather    Hypertension Brother    Hyperlipidemia Brother    Heart disease Brother        Valve Surgery ?   Diabetes Brother    Hypertension Brother    ADD / ADHD Brother     Social History   Socioeconomic History   Marital status: Married    Spouse name: Not on file   Number of children: Not on file   Years of education: Not on file   Highest education level: 12th grade  Occupational History   Not on file  Tobacco Use   Smoking status: Never   Smokeless tobacco: Never  Substance and Sexual  Activity   Alcohol use: Never   Drug use: Never   Sexual activity: Yes    Partners: Female    Birth control/protection: None    Comment: lives with wife, does office work follows with  Other Topics Concern   Not on file  Social History Narrative   Married, 59 y/o girl.     Lives in [place], grew up in [place]--relocated to [facility] 2007.   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         Social Determinants of Health   Financial Resource Strain: Low Risk  (03/09/2023)   Overall Financial Resource Strain (CARDIA)    Difficulty of Paying Living Expenses: Not hard at all  Food Insecurity: No Food Insecurity (03/09/2023)    Hunger Vital Sign    Worried About Running Out of Food in the Last Year: Never true    Ran Out of Food in the Last Year: Never true  Transportation Needs: No Transportation Needs (03/09/2023)   PRAPARE - Administrator, Civil Service (Medical): No    Lack of Transportation (Non-Medical): No  Physical Activity: Insufficiently Active (03/09/2023)   Exercise Vital Sign    Days of Exercise per Week: 2 days    Minutes of Exercise per Session: 20 min  Stress: No Stress Concern Present (03/09/2023)   [employer] of Occupational Health - Occupational Stress Questionnaire    Feeling of Stress : Only a little  Social Connections: Unknown (03/09/2023)   Social Connection and Isolation Panel [NHANES]    Frequency of Communication with Friends and Family: Once a week    Frequency of Social Gatherings with Friends and Family: Once a week    Attends Religious Services: Patient declined    Database administrator or Organizations: No    Attends Engineer, structural: Not on file    Marital Status: Married  Catering manager Violence: Not on file    Outpatient Medications Prior to Visit  Medication Sig Dispense Refill   albuterol (VENTOLIN HFA) 108 (90 Base) MCG/ACT inhaler TAKE 2 PUFFS BY MOUTH EVERY 6 HOURS AS NEEDED FOR WHEEZE OR SHORTNESS OF BREATH 18 each 2   amitriptyline (ELAVIL) 10 MG tablet TAKE 1/2 TO 2 TABLETS(5 TO 20 MG) BY MOUTH AT BEDTIME AS NEEDED FOR SLEEP 40 tablet 1   azaTHIOprine (IMURAN) 50 MG tablet TAKE 4.5 TABLETS BY MOUTH EVERY DAY 405 tablet 1   balsalazide (COLAZAL) 750 MG capsule TAKE 3 CAPSULES(2250 MG) BY MOUTH THREE TIMES DAILY 270 capsule 2   Continuous Blood Gluc Receiver (DEXCOM G6 RECEIVER) DEVI Use for continuous blood glucose monitoring 1 each 0   Continuous Blood Gluc Sensor (DEXCOM G6 SENSOR) MISC Use for continuous blood glucose monitoring.  Replace every 10 days. 4 each 0   Continuous Blood Gluc Transmit (DEXCOM G6 TRANSMITTER) MISC Use for  continuous blood glucose monitoring.  Replace every 90 days 1 each 1   Dulaglutide (TRULICITY) 3 MG/0.5ML SOPN INJECT 3MG  UNDER THE SKIN EVERY WEEK 6 mL 1   famotidine (PEPCID) 40 MG tablet Take 1 tablet (40 mg total) by mouth daily. 30 tablet 6   fenofibrate 160 MG tablet TAKE 1 TABLET BY MOUTH EVERY DAY 30 tablet 2   fluticasone (FLONASE) 50 MCG/ACT nasal spray Place 2 sprays into both nostrils daily. 16 g 5   fluticasone (FLOVENT HFA) 110 MCG/ACT inhaler Inhale 2 puffs into the lungs  2 (two) times daily. 1 Inhaler 2   imiquimod (ALDARA) 5 % cream Apply topically 3 (three) times a week. 12 each 0   Insulin Glargine (BASAGLAR KWIKPEN) 100 UNIT/ML Inject 50 Units into the skin daily. 45 mL 1   Insulin Pen Needle (BD PEN NEEDLE NANO 2ND GEN) 32G X 4 MM MISC USE 1 NEEDLE DAILY AS DIRECTED 100 each 11   Lancets (ONETOUCH DELICA PLUS LANCET30G) MISC USE AS DIRECTED TWICE DAILY TO CHECK BLOOD SUGAR 100 each 0   levocetirizine (XYZAL) 5 MG tablet TAKE 1 TABLET BY MOUTH IN THE EVENING 30 tablet 2   losartan (COZAAR) 100 MG tablet TAKE 1 TABLET BY MOUTH EVERY DAY 30 tablet 2   meloxicam (MOBIC) 15 MG tablet Take 15 mg by mouth daily.     metFORMIN (GLUCOPHAGE) 1000 MG tablet TAKE 1 TABLET BY MOUTH TWICE A DAY WITH A MEAL 60 tablet 2   methocarbamol (ROBAXIN) 500 MG tablet Take 1 tablet (500 mg total) by mouth every 6 (six) hours as needed for muscle spasms. 60 tablet 2   montelukast (SINGULAIR) 10 MG tablet Take 1 tablet (10 mg total) by mouth at bedtime. 90 tablet 1   olopatadine (PATANOL) 0.1 % ophthalmic solution Place 1 drop into both eyes 2 (two) times daily. 5 mL 3   omeprazole (PRILOSEC) 40 MG capsule TAKE 1 CAPSULE(40 MG) BY MOUTH DAILY 30 capsule 3   rosuvastatin (CRESTOR) 5 MG tablet TAKE 1 TABLET(5 MG) BY MOUTH DAILY 90 tablet 1   tadalafil (CIALIS) 20 MG tablet Take 1 tablet (20 mg total) by mouth daily as needed for erectile dysfunction. 30 tablet 11   traMADol (ULTRAM) 50 MG tablet Take 1  tablet (50 mg total) by mouth every 8 (eight) hours as needed. 60 tablet 0   valACYclovir (VALTREX) 1000 MG tablet Take 1 tablet (1,000 mg total) by mouth 2 (two) times daily as needed. 60 tablet 3   zaleplon (SONATA) 10 MG capsule Take 1 capsule (10 mg total) by mouth at bedtime as needed for sleep. 30 capsule 2   No facility-administered medications prior to visit.    Allergies  Allergen Reactions   Penicillins Hives   Simvastatin     Myalgias    Review of Systems  Constitutional:  Negative for chills and fever.  Respiratory:  Negative for shortness of breath.   Cardiovascular:  Negative for chest pain and palpitations.  Gastrointestinal:  Negative for abdominal pain, blood in stool, constipation, diarrhea, nausea and vomiting.  Genitourinary:  Negative for dysuria, frequency, hematuria and urgency.  Musculoskeletal:        (+) bilateral hip pain.  Skin:           Neurological:  Negative for headaches.       Objective:    Physical Exam Constitutional:      General: He is not in acute distress.    Appearance: Normal appearance. He is not ill-appearing.  HENT:     Head: Normocephalic and atraumatic.     Right Ear: External ear normal.     Left Ear: External ear normal.     Nose: Nose normal.     Mouth/Throat:     Mouth: Mucous membranes are moist.     Pharynx: Oropharynx is clear.  Eyes:     General:        Right eye: No discharge.        Left eye: No discharge.     Extraocular Movements: Extraocular movements  intact.     Conjunctiva/sclera: Conjunctivae normal.     Pupils: Pupils are equal, round, and reactive to light.  Cardiovascular:     Rate and Rhythm: Normal rate and regular rhythm.     Pulses: Normal pulses.     Heart sounds: Normal heart sounds. No murmur heard.    No gallop.  Pulmonary:     Effort: Pulmonary effort is normal. No respiratory distress.     Breath sounds: Normal breath sounds. No wheezing or rales.  Abdominal:     General: Bowel sounds  are normal.     Palpations: Abdomen is soft.     Tenderness: There is no abdominal tenderness. There is no guarding.  Musculoskeletal:        General: Normal range of motion.     Cervical back: Normal range of motion.     Right lower leg: No edema.     Left lower leg: No edema.  Skin:    General: Skin is warm and dry.  Neurological:     Mental Status: He is alert and oriented to person, place, and time.  Psychiatric:        Mood and Affect: Mood normal.        Behavior: Behavior normal.        Judgment: Judgment normal.     There were no vitals taken for this visit. Wt Readings from Last 3 Encounters:  12/10/22 179 lb 9.6 oz (81.5 kg)  10/17/22 168 lb (76.2 kg)  07/10/22 176 lb (79.8 kg)    Diabetic Foot Exam - Simple   No data filed    Lab Results  Component Value Date   WBC 4.9 12/10/2022   HGB 13.3 12/10/2022   HCT 37.2 (L) 12/10/2022   PLT 280.0 12/10/2022   GLUCOSE 270 (H) 12/10/2022   CHOL 127 12/10/2022   TRIG 156.0 (H) 12/10/2022   HDL 39.30 12/10/2022   LDLCALC 57 12/10/2022   ALT 48 12/10/2022   AST 26 12/10/2022   NA 137 12/10/2022   K 4.5 12/10/2022   CL 103 12/10/2022   CREATININE 0.92 12/10/2022   BUN 14 12/10/2022   CO2 25 12/10/2022   TSH 1.10 12/10/2022   PSA 1.05 12/10/2022   HGBA1C 8.0 (H) 12/10/2022   MICROALBUR 26.3 (H) 12/10/2022    Lab Results  Component Value Date   TSH 1.10 12/10/2022   Lab Results  Component Value Date   WBC 4.9 12/10/2022   HGB 13.3 12/10/2022   HCT 37.2 (L) 12/10/2022   MCV 96.7 12/10/2022   PLT 280.0 12/10/2022   Lab Results  Component Value Date   NA 137 12/10/2022   K 4.5 12/10/2022   CO2 25 12/10/2022   GLUCOSE 270 (H) 12/10/2022   BUN 14 12/10/2022   CREATININE 0.92 12/10/2022   BILITOT 0.8 12/10/2022   ALKPHOS 77 12/10/2022   AST 26 12/10/2022   ALT 48 12/10/2022   PROT 7.1 12/10/2022   ALBUMIN 4.4 12/10/2022   CALCIUM 9.8 12/10/2022   GFR 91.92 12/10/2022   Lab Results  Component  Value Date   CHOL 127 12/10/2022   Lab Results  Component Value Date   HDL 39.30 12/10/2022   Lab Results  Component Value Date   LDLCALC 57 12/10/2022   Lab Results  Component Value Date   TRIG 156.0 (H) 12/10/2022   Lab Results  Component Value Date   CHOLHDL 3 12/10/2022   Lab Results  Component Value Date   HGBA1C  8.0 (H) 12/10/2022      Assessment & Plan:  Healthy Lifestyle: Encouraged 6-8 hours of sleep, heart healthy diet, 60-80 oz of non-alcohol/non-caffeinated fluids, and 4000-8000 steps daily.  Hypertension: This is well-controlled with Losartan 100 mg. Referral placed to cardiology due to type 2 diabetes mellitus, hyperlipidemia, hypertension, and family history of heart disease.   Labs: Routine blood work ordered.  Bilateral Hip Pain: Recommended up to 3000 mg Tylenol daily.  Type 2 Diabetes Mellitus: This is managed with Basaglar 50 units daily and Trulicity 3 mg/0.5 mL once weekly which have been tolerable. Freestyle Libre sensor refilled today. Problem List Items Addressed This Visit       Cardiovascular and Mediastinum   Hypertension     Digestive   Fatty liver disease, nonalcoholic - Primary     Endocrine   Type 2 diabetes mellitus with hyperglycemia (HCC)     Other   Hyperlipidemia   No orders of the defined types were placed in this encounter.  I, Curtis Kennedy, personally preformed the services described in this documentation.  All medical record entries made by the scribe were at my direction and in my presence.  I have reviewed the chart and discharge instructions (if applicable) and agree that the record reflects my personal performance and is accurate and complete. 03/12/2023  I,Mohammed Iqbal,acting as a scribe for Danise Edge, MD.,have documented all relevant documentation on the behalf of Danise Edge, MD,as directed by  Danise Edge, MD while in the presence of Danise Edge, MD.  Curtis Kennedy

## 2023-03-12 NOTE — Patient Instructions (Signed)
Muscle Cramps and Spasms Muscle cramps and spasms occur when a muscle or muscles tighten and you have no control over this tightening (involuntary muscle contraction). They are a common problem that can happen in any muscle. The most common place is in the calf muscles of the leg. There are a few ways that muscle cramps and spasms differ: Muscle cramps are painful. They come and go and may last for a few seconds or up to 15 minutes. Muscle cramps are often more forceful and last longer than muscle spasms. Muscle spasms may or may not be painful. They may last just a few seconds or last much longer. Certain conditions, such as diabetes or Parkinson's disease, can make you more likely to have cramps or spasms. But in most cases, cramps and spasms are not caused by other conditions. Common causes include: Overexertion. This is when you do more physical work or exercise than your body is ready for. Overuse from doing the same movements too many times. Staying in one position for too long. Improper preparation, form, or technique when playing a sport or doing an activity. Not enough water or other fluids in your body (dehydration). Other causes may include: Injury. Side effects of some medicines. Too few salts and minerals in your body (electrolytes), such as potassium and calcium. This could happen if you are taking water pills (diuretics) or if you are pregnant. In many cases, the cause of muscle cramps or spasms is not known. Follow these instructions at home: Eating and drinking Drink enough fluid to keep your pee (urine) pale yellow. This can help prevent cramps or spasms. Eat a healthy diet that includes a lot of nutrients to help your muscles work. A healthy diet includes fruits and vegetables, lean protein, whole grains, and low-fat or nonfat dairy products. Managing pain and stiffness     Try to massage, stretch, and relax the affected muscle. Do this for a few minutes at a time. If told,  put ice on the muscles. This may help if you are sore or have pain after a cramp or spasm. Put ice in a plastic bag. Place a towel between your skin and the bag. Leave the ice on for 20 minutes, 2-3 times a day. If told, apply heat to tight or tense muscles as often as told by your health care provider. Use the heat source that your provider recommends, such as a moist heat pack or a heating pad. Place a towel between your skin and the heat source. Leave the heat on for 20-30 minutes. If your skin turns bright red, remove the ice or heat right away to prevent skin damage. The risk of damage is higher if you cannot feel pain, heat, or cold. Take hot showers or baths to help relax tight muscles. General instructions If you are having cramps often, avoid intense exercise for a few days. Take over-the-counter and prescription medicines only as told by your provider. Watch for any changes in your symptoms. Contact a health care provider if: Your cramps or spasms get more severe or happen more often. Your cramps or spasms do not get better over time. This information is not intended to replace advice given to you by your health care provider. Make sure you discuss any questions you have with your health care provider. Document Revised: 05/22/2022 Document Reviewed: 05/22/2022 Elsevier Patient Education  2024 [employer].

## 2023-03-13 ENCOUNTER — Other Ambulatory Visit: Payer: Self-pay

## 2023-03-13 DIAGNOSIS — I10 Essential (primary) hypertension: Secondary | ICD-10-CM

## 2023-03-13 DIAGNOSIS — R252 Cramp and spasm: Secondary | ICD-10-CM

## 2023-03-13 DIAGNOSIS — E782 Mixed hyperlipidemia: Secondary | ICD-10-CM

## 2023-03-13 DIAGNOSIS — E1165 Type 2 diabetes mellitus with hyperglycemia: Secondary | ICD-10-CM

## 2023-03-13 LAB — COMPREHENSIVE METABOLIC PANEL
ALT: 37 U/L (ref 0–53)
AST: 30 U/L (ref 0–37)
Albumin: 4.2 g/dL (ref 3.5–5.2)
Alkaline Phosphatase: 52 U/L (ref 39–117)
BUN: 10 mg/dL (ref 6–23)
CO2: 27 mEq/L (ref 19–32)
Calcium: 9 mg/dL (ref 8.4–10.5)
Chloride: 105 mEq/L (ref 96–112)
Creatinine, Ser: 0.86 mg/dL (ref 0.40–1.50)
GFR: 95.51 mL/min (ref >60.00)
Glucose, Bld: 153 mg/dL — ABNORMAL HIGH (ref 70–99)
Potassium: 4.1 mEq/L (ref 3.5–5.1)
Sodium: 140 mEq/L (ref 135–145)
Total Bilirubin: 0.9 mg/dL (ref 0.2–1.2)
Total Protein: 6.8 g/dL (ref 6.0–8.3)

## 2023-03-13 LAB — CBC WITH DIFFERENTIAL/PLATELET
Basophils Absolute: 0 10*3/uL (ref 0.0–0.1)
Basophils Relative: 1 % (ref 0.0–3.0)
Eosinophils Absolute: 0.1 10*3/uL (ref 0.0–0.7)
Eosinophils Relative: 1.9 % (ref 0.0–5.0)
HCT: 33.4 % — ABNORMAL LOW (ref 39.0–52.0)
Hemoglobin: 11.7 g/dL — ABNORMAL LOW (ref 13.0–17.0)
Lymphocytes Relative: 23.8 % (ref 12.0–46.0)
Lymphs Abs: 0.9 10*3/uL (ref 0.7–4.0)
MCHC: 34.9 g/dL (ref 30.0–36.0)
MCV: 97.6 fl (ref 78.0–100.0)
Monocytes Absolute: 0.2 10*3/uL (ref 0.1–1.0)
Monocytes Relative: 5.6 % (ref 3.0–12.0)
Neutro Abs: 2.6 10*3/uL (ref 1.4–7.7)
Neutrophils Relative %: 67.7 % (ref 43.0–77.0)
Platelets: 208 10*3/uL (ref 150.0–400.0)
RBC: 3.42 Mil/uL — ABNORMAL LOW (ref 4.22–5.81)
RDW: 15 % (ref 11.5–15.5)
WBC: 3.8 10*3/uL — ABNORMAL LOW (ref 4.0–10.5)

## 2023-03-13 LAB — MAGNESIUM: Magnesium: 1.4 mg/dL — ABNORMAL LOW (ref 1.5–2.5)

## 2023-03-13 LAB — LIPID PANEL
Cholesterol: 111 mg/dL (ref 0–200)
HDL: 36.5 mg/dL — ABNORMAL LOW (ref >39.00)
LDL Cholesterol: 58 mg/dL (ref 0–99)
NonHDL: 74.8
Total CHOL/HDL Ratio: 3
Triglycerides: 86 mg/dL (ref 0.0–149.0)
VLDL: 17.2 mg/dL (ref 0.0–40.0)

## 2023-03-13 LAB — HEMOGLOBIN A1C: Hgb A1c MFr Bld: 7.8 % — ABNORMAL HIGH (ref 4.6–6.5)

## 2023-03-13 LAB — TSH: TSH: 1.37 u[IU]/mL (ref 0.35–5.50)

## 2023-03-13 NOTE — Assessment & Plan Note (Signed)
Can try max of 3000 mg of tylenol each 24 hours and stay active

## 2023-03-13 NOTE — Assessment & Plan Note (Signed)
Improved some with addition of magnesium Glycinate and improved hydration. Will continue to monitor

## 2023-03-14 ENCOUNTER — Other Ambulatory Visit: Payer: Self-pay

## 2023-03-14 ENCOUNTER — Telehealth: Payer: Self-pay

## 2023-03-14 DIAGNOSIS — Z1211 Encounter for screening for malignant neoplasm of colon: Secondary | ICD-10-CM

## 2023-03-14 NOTE — Telephone Encounter (Signed)
Cologuard has been ordered. 

## 2023-03-14 NOTE — Telephone Encounter (Signed)
Cologuard sent

## 2023-03-17 ENCOUNTER — Encounter: Payer: Self-pay | Admitting: Family Medicine

## 2023-03-19 ENCOUNTER — Other Ambulatory Visit: Payer: Self-pay

## 2023-03-19 ENCOUNTER — Encounter: Payer: Self-pay | Admitting: Family Medicine

## 2023-03-19 DIAGNOSIS — M545 Low back pain, unspecified: Secondary | ICD-10-CM | POA: Diagnosis not present

## 2023-03-19 DIAGNOSIS — M9904 Segmental and somatic dysfunction of sacral region: Secondary | ICD-10-CM | POA: Diagnosis not present

## 2023-03-19 DIAGNOSIS — M9902 Segmental and somatic dysfunction of thoracic region: Secondary | ICD-10-CM | POA: Diagnosis not present

## 2023-03-19 DIAGNOSIS — M9901 Segmental and somatic dysfunction of cervical region: Secondary | ICD-10-CM | POA: Diagnosis not present

## 2023-03-19 DIAGNOSIS — M9903 Segmental and somatic dysfunction of lumbar region: Secondary | ICD-10-CM | POA: Diagnosis not present

## 2023-03-19 MED ORDER — DEXCOM G7 SENSOR MISC: 5 refills | Status: AC

## 2023-03-26 DIAGNOSIS — M9901 Segmental and somatic dysfunction of cervical region: Secondary | ICD-10-CM | POA: Diagnosis not present

## 2023-03-26 DIAGNOSIS — M545 Low back pain, unspecified: Secondary | ICD-10-CM | POA: Diagnosis not present

## 2023-03-26 DIAGNOSIS — M9902 Segmental and somatic dysfunction of thoracic region: Secondary | ICD-10-CM | POA: Diagnosis not present

## 2023-03-26 DIAGNOSIS — M9904 Segmental and somatic dysfunction of sacral region: Secondary | ICD-10-CM | POA: Diagnosis not present

## 2023-03-26 DIAGNOSIS — M9903 Segmental and somatic dysfunction of lumbar region: Secondary | ICD-10-CM | POA: Diagnosis not present

## 2023-04-04 ENCOUNTER — Other Ambulatory Visit: Payer: Self-pay | Admitting: Family Medicine

## 2023-04-08 ENCOUNTER — Other Ambulatory Visit (HOSPITAL_BASED_OUTPATIENT_CLINIC_OR_DEPARTMENT_OTHER): Payer: Self-pay

## 2023-04-08 MED FILL — Dulaglutide Soln Auto-injector 3 MG/0.5ML: SUBCUTANEOUS | 28 days supply | Qty: 2 | Fill #0 | Status: AC

## 2023-04-09 DIAGNOSIS — M545 Low back pain, unspecified: Secondary | ICD-10-CM | POA: Diagnosis not present

## 2023-04-09 DIAGNOSIS — M9903 Segmental and somatic dysfunction of lumbar region: Secondary | ICD-10-CM | POA: Diagnosis not present

## 2023-04-09 DIAGNOSIS — M9904 Segmental and somatic dysfunction of sacral region: Secondary | ICD-10-CM | POA: Diagnosis not present

## 2023-04-09 DIAGNOSIS — M9902 Segmental and somatic dysfunction of thoracic region: Secondary | ICD-10-CM | POA: Diagnosis not present

## 2023-04-09 DIAGNOSIS — M9901 Segmental and somatic dysfunction of cervical region: Secondary | ICD-10-CM | POA: Diagnosis not present

## 2023-04-24 DIAGNOSIS — M9901 Segmental and somatic dysfunction of cervical region: Secondary | ICD-10-CM | POA: Diagnosis not present

## 2023-04-24 DIAGNOSIS — M545 Low back pain, unspecified: Secondary | ICD-10-CM | POA: Diagnosis not present

## 2023-04-24 DIAGNOSIS — M9904 Segmental and somatic dysfunction of sacral region: Secondary | ICD-10-CM | POA: Diagnosis not present

## 2023-04-24 DIAGNOSIS — M9903 Segmental and somatic dysfunction of lumbar region: Secondary | ICD-10-CM | POA: Diagnosis not present

## 2023-04-24 DIAGNOSIS — M9902 Segmental and somatic dysfunction of thoracic region: Secondary | ICD-10-CM | POA: Diagnosis not present

## 2023-04-30 DIAGNOSIS — H43392 Other vitreous opacities, left eye: Secondary | ICD-10-CM | POA: Diagnosis not present

## 2023-04-30 DIAGNOSIS — E113293 Type 2 diabetes mellitus with mild nonproliferative diabetic retinopathy without macular edema, bilateral: Secondary | ICD-10-CM | POA: Diagnosis not present

## 2023-04-30 DIAGNOSIS — H25813 Combined forms of age-related cataract, bilateral: Secondary | ICD-10-CM | POA: Diagnosis not present

## 2023-04-30 DIAGNOSIS — H40013 Open angle with borderline findings, low risk, bilateral: Secondary | ICD-10-CM | POA: Diagnosis not present

## 2023-04-30 LAB — HM DIABETES EYE EXAM

## 2023-05-01 ENCOUNTER — Other Ambulatory Visit (HOSPITAL_BASED_OUTPATIENT_CLINIC_OR_DEPARTMENT_OTHER): Payer: Self-pay

## 2023-05-01 ENCOUNTER — Other Ambulatory Visit: Payer: Self-pay | Admitting: Family Medicine

## 2023-05-01 DIAGNOSIS — E7849 Other hyperlipidemia: Secondary | ICD-10-CM

## 2023-05-01 DIAGNOSIS — I10 Essential (primary) hypertension: Secondary | ICD-10-CM

## 2023-05-01 DIAGNOSIS — E119 Type 2 diabetes mellitus without complications: Secondary | ICD-10-CM

## 2023-05-01 DIAGNOSIS — E785 Hyperlipidemia, unspecified: Secondary | ICD-10-CM

## 2023-05-01 MED FILL — Dulaglutide Soln Auto-injector 3 MG/0.5ML: SUBCUTANEOUS | 28 days supply | Qty: 2 | Fill #1 | Status: AC

## 2023-05-08 DIAGNOSIS — M9904 Segmental and somatic dysfunction of sacral region: Secondary | ICD-10-CM | POA: Diagnosis not present

## 2023-05-08 DIAGNOSIS — M545 Low back pain, unspecified: Secondary | ICD-10-CM | POA: Diagnosis not present

## 2023-05-08 DIAGNOSIS — M9903 Segmental and somatic dysfunction of lumbar region: Secondary | ICD-10-CM | POA: Diagnosis not present

## 2023-05-08 DIAGNOSIS — M9901 Segmental and somatic dysfunction of cervical region: Secondary | ICD-10-CM | POA: Diagnosis not present

## 2023-05-08 DIAGNOSIS — M9902 Segmental and somatic dysfunction of thoracic region: Secondary | ICD-10-CM | POA: Diagnosis not present

## 2023-05-22 DIAGNOSIS — M9904 Segmental and somatic dysfunction of sacral region: Secondary | ICD-10-CM | POA: Diagnosis not present

## 2023-05-22 DIAGNOSIS — M9902 Segmental and somatic dysfunction of thoracic region: Secondary | ICD-10-CM | POA: Diagnosis not present

## 2023-05-22 DIAGNOSIS — M9901 Segmental and somatic dysfunction of cervical region: Secondary | ICD-10-CM | POA: Diagnosis not present

## 2023-05-22 DIAGNOSIS — M545 Low back pain, unspecified: Secondary | ICD-10-CM | POA: Diagnosis not present

## 2023-05-22 DIAGNOSIS — M9903 Segmental and somatic dysfunction of lumbar region: Secondary | ICD-10-CM | POA: Diagnosis not present

## 2023-05-26 ENCOUNTER — Other Ambulatory Visit: Payer: Self-pay | Admitting: Family Medicine

## 2023-05-27 MED FILL — Dulaglutide Soln Auto-injector 3 MG/0.5ML: SUBCUTANEOUS | 28 days supply | Qty: 2 | Fill #2 | Status: AC

## 2023-05-28 ENCOUNTER — Ambulatory Visit: Payer: 59 | Admitting: Interventional Cardiology

## 2023-06-04 DIAGNOSIS — M9904 Segmental and somatic dysfunction of sacral region: Secondary | ICD-10-CM | POA: Diagnosis not present

## 2023-06-04 DIAGNOSIS — M9901 Segmental and somatic dysfunction of cervical region: Secondary | ICD-10-CM | POA: Diagnosis not present

## 2023-06-04 DIAGNOSIS — M545 Low back pain, unspecified: Secondary | ICD-10-CM | POA: Diagnosis not present

## 2023-06-04 DIAGNOSIS — M9903 Segmental and somatic dysfunction of lumbar region: Secondary | ICD-10-CM | POA: Diagnosis not present

## 2023-06-04 DIAGNOSIS — M9902 Segmental and somatic dysfunction of thoracic region: Secondary | ICD-10-CM | POA: Diagnosis not present

## 2023-06-19 DIAGNOSIS — M9901 Segmental and somatic dysfunction of cervical region: Secondary | ICD-10-CM | POA: Diagnosis not present

## 2023-06-19 DIAGNOSIS — M9902 Segmental and somatic dysfunction of thoracic region: Secondary | ICD-10-CM | POA: Diagnosis not present

## 2023-06-19 DIAGNOSIS — M545 Low back pain, unspecified: Secondary | ICD-10-CM | POA: Diagnosis not present

## 2023-06-19 DIAGNOSIS — M9904 Segmental and somatic dysfunction of sacral region: Secondary | ICD-10-CM | POA: Diagnosis not present

## 2023-06-19 DIAGNOSIS — M9903 Segmental and somatic dysfunction of lumbar region: Secondary | ICD-10-CM | POA: Diagnosis not present

## 2023-06-22 ENCOUNTER — Other Ambulatory Visit: Payer: Self-pay | Admitting: Family Medicine

## 2023-06-22 DIAGNOSIS — I10 Essential (primary) hypertension: Secondary | ICD-10-CM

## 2023-06-22 DIAGNOSIS — E785 Hyperlipidemia, unspecified: Secondary | ICD-10-CM

## 2023-06-22 DIAGNOSIS — E119 Type 2 diabetes mellitus without complications: Secondary | ICD-10-CM

## 2023-06-25 MED FILL — Dulaglutide Soln Auto-injector 3 MG/0.5ML: SUBCUTANEOUS | 28 days supply | Qty: 2 | Fill #3 | Status: AC

## 2023-07-15 ENCOUNTER — Other Ambulatory Visit: Payer: Self-pay | Admitting: Family Medicine

## 2023-07-18 ENCOUNTER — Encounter: Payer: 59 | Admitting: Family Medicine

## 2023-07-21 ENCOUNTER — Other Ambulatory Visit: Payer: Self-pay | Admitting: Family Medicine

## 2023-07-21 DIAGNOSIS — E785 Hyperlipidemia, unspecified: Secondary | ICD-10-CM

## 2023-07-21 DIAGNOSIS — I10 Essential (primary) hypertension: Secondary | ICD-10-CM

## 2023-07-21 DIAGNOSIS — E7849 Other hyperlipidemia: Secondary | ICD-10-CM

## 2023-07-21 DIAGNOSIS — E119 Type 2 diabetes mellitus without complications: Secondary | ICD-10-CM

## 2023-07-22 ENCOUNTER — Other Ambulatory Visit: Payer: Self-pay

## 2023-07-22 ENCOUNTER — Encounter: Payer: Self-pay | Admitting: Family Medicine

## 2023-07-22 MED FILL — Dulaglutide Soln Auto-injector 3 MG/0.5ML: SUBCUTANEOUS | 28 days supply | Qty: 2 | Fill #4 | Status: AC

## 2023-07-22 NOTE — Telephone Encounter (Signed)
Refills sent

## 2023-07-26 DIAGNOSIS — M9904 Segmental and somatic dysfunction of sacral region: Secondary | ICD-10-CM | POA: Diagnosis not present

## 2023-07-26 DIAGNOSIS — M9902 Segmental and somatic dysfunction of thoracic region: Secondary | ICD-10-CM | POA: Diagnosis not present

## 2023-07-26 DIAGNOSIS — M545 Low back pain, unspecified: Secondary | ICD-10-CM | POA: Diagnosis not present

## 2023-07-26 DIAGNOSIS — M9901 Segmental and somatic dysfunction of cervical region: Secondary | ICD-10-CM | POA: Diagnosis not present

## 2023-07-26 DIAGNOSIS — M9903 Segmental and somatic dysfunction of lumbar region: Secondary | ICD-10-CM | POA: Diagnosis not present

## 2023-08-06 DIAGNOSIS — M9901 Segmental and somatic dysfunction of cervical region: Secondary | ICD-10-CM | POA: Diagnosis not present

## 2023-08-06 DIAGNOSIS — M545 Low back pain, unspecified: Secondary | ICD-10-CM | POA: Diagnosis not present

## 2023-08-06 DIAGNOSIS — M9903 Segmental and somatic dysfunction of lumbar region: Secondary | ICD-10-CM | POA: Diagnosis not present

## 2023-08-06 DIAGNOSIS — M9902 Segmental and somatic dysfunction of thoracic region: Secondary | ICD-10-CM | POA: Diagnosis not present

## 2023-08-06 DIAGNOSIS — M9904 Segmental and somatic dysfunction of sacral region: Secondary | ICD-10-CM | POA: Diagnosis not present

## 2023-08-17 MED FILL — Dulaglutide Soln Auto-injector 3 MG/0.5ML: SUBCUTANEOUS | 28 days supply | Qty: 2 | Fill #5 | Status: AC

## 2023-08-18 ENCOUNTER — Other Ambulatory Visit: Payer: Self-pay | Admitting: Family Medicine

## 2023-08-18 DIAGNOSIS — E1165 Type 2 diabetes mellitus with hyperglycemia: Secondary | ICD-10-CM

## 2023-08-19 DIAGNOSIS — L820 Inflamed seborrheic keratosis: Secondary | ICD-10-CM | POA: Diagnosis not present

## 2023-08-19 DIAGNOSIS — L570 Actinic keratosis: Secondary | ICD-10-CM | POA: Diagnosis not present

## 2023-08-19 DIAGNOSIS — D485 Neoplasm of uncertain behavior of skin: Secondary | ICD-10-CM | POA: Diagnosis not present

## 2023-08-19 DIAGNOSIS — D225 Melanocytic nevi of trunk: Secondary | ICD-10-CM | POA: Diagnosis not present

## 2023-08-19 DIAGNOSIS — Z85828 Personal history of other malignant neoplasm of skin: Secondary | ICD-10-CM | POA: Diagnosis not present

## 2023-08-19 DIAGNOSIS — L814 Other melanin hyperpigmentation: Secondary | ICD-10-CM | POA: Diagnosis not present

## 2023-08-19 DIAGNOSIS — L578 Other skin changes due to chronic exposure to nonionizing radiation: Secondary | ICD-10-CM | POA: Diagnosis not present

## 2023-08-22 DIAGNOSIS — M7062 Trochanteric bursitis, left hip: Secondary | ICD-10-CM | POA: Diagnosis not present

## 2023-08-22 DIAGNOSIS — M25552 Pain in left hip: Secondary | ICD-10-CM | POA: Diagnosis not present

## 2023-08-22 DIAGNOSIS — M25551 Pain in right hip: Secondary | ICD-10-CM | POA: Diagnosis not present

## 2023-08-22 DIAGNOSIS — M7061 Trochanteric bursitis, right hip: Secondary | ICD-10-CM | POA: Diagnosis not present

## 2023-08-22 NOTE — Progress Notes (Signed)
Referring-Curtis Abner Greenspan, MD Reason for referral-hypertension and hyperlipidemia  HPI: 57 year old male for evaluation of hypertension and hyperlipidemia at request of Danise Edge, MD.  Most recent lipid panel Mar 12, 2003 showed total cholesterol 111, triglycerides 86, HDL 36.5, LDL 58.  Patient has dyspnea with more vigorous activities but not routine activities.  No orthopnea, PND, pedal edema, chest pain or syncope.  Current Outpatient Medications  Medication Sig Dispense Refill   albuterol (VENTOLIN HFA) 108 (90 Base) MCG/ACT inhaler TAKE 2 PUFFS BY MOUTH EVERY 6 HOURS AS NEEDED FOR WHEEZE OR SHORTNESS OF BREATH 18 each 2   amitriptyline (ELAVIL) 10 MG tablet TAKE 1/2 TO 2 TABLETS(5 TO 20 MG) BY MOUTH AT BEDTIME AS NEEDED FOR SLEEP 40 tablet 1   azaTHIOprine (IMURAN) 50 MG tablet TAKE 4 AND 1/2 TAB BY MOUTH EVERY DAY 405 tablet 1   balsalazide (COLAZAL) 750 MG capsule TAKE 3 CAPSULES(2250 MG) BY MOUTH THREE TIMES DAILY 270 capsule 2   Continuous Blood Gluc Receiver (DEXCOM G6 RECEIVER) DEVI Use for continuous blood glucose monitoring 1 each 0   Continuous Blood Gluc Sensor (DEXCOM G6 SENSOR) MISC Use for continuous blood glucose monitoring.  Replace every 10 days. 4 each 0   Continuous Blood Gluc Transmit (DEXCOM G6 TRANSMITTER) MISC Use for continuous blood glucose monitoring.  Replace every 90 days 1 each 1   Continuous Glucose Sensor (DEXCOM G7 SENSOR) MISC Check blood sugar daily 2 each 5   Continuous Glucose Sensor (FREESTYLE LIBRE 3 SENSOR) MISC Place 1 sensor on the skin every 14 days. Use to check glucose continuously 1 each 0   Dulaglutide (TRULICITY) 3 MG/0.5ML SOPN Inject 3 mg into the skin once a week. 6 mL 1   famotidine (PEPCID) 40 MG tablet TAKE 1 TABLET BY MOUTH EVERY DAY 30 tablet 2   fenofibrate 160 MG tablet TAKE 1 TABLET BY MOUTH EVERY DAY 30 tablet 2   fluticasone (FLONASE) 50 MCG/ACT nasal spray Place 2 sprays into both nostrils daily. 16 g 5   fluticasone  (FLOVENT HFA) 110 MCG/ACT inhaler Inhale 2 puffs into the lungs 2 (two) times daily. 1 Inhaler 2   imiquimod (ALDARA) 5 % cream Apply topically 3 (three) times a week. 12 each 0   Insulin Glargine (BASAGLAR KWIKPEN) 100 UNIT/ML Inject 50 Units into the skin daily. 15 mL 1   Insulin Pen Needle (BD PEN NEEDLE NANO 2ND GEN) 32G X 4 MM MISC USE 1 NEEDLE DAILY AS DIRECTED 100 each 11   Lancets (ONETOUCH DELICA PLUS LANCET30G) MISC USE AS DIRECTED TWICE DAILY TO CHECK BLOOD SUGAR 100 each 0   levocetirizine (XYZAL) 5 MG tablet TAKE 1 TABLET BY MOUTH EVERY DAY IN THE EVENING 30 tablet 2   losartan (COZAAR) 100 MG tablet TAKE 1 TABLET BY MOUTH EVERY DAY 30 tablet 2   meloxicam (MOBIC) 15 MG tablet Take 15 mg by mouth daily.     metFORMIN (GLUCOPHAGE) 1000 MG tablet TAKE 1 TABLET BY MOUTH TWICE A DAY WITH FOOD 60 tablet 2   methocarbamol (ROBAXIN) 500 MG tablet Take 1 tablet (500 mg total) by mouth every 6 (six) hours as needed for muscle spasms. 60 tablet 2   montelukast (SINGULAIR) 10 MG tablet TAKE 1 TABLET BY MOUTH EVERYDAY AT BEDTIME 30 tablet 5   olopatadine (PATANOL) 0.1 % ophthalmic solution Place 1 drop into both eyes 2 (two) times daily. 5 mL 3   omeprazole (PRILOSEC) 40 MG capsule TAKE 1 CAPSULE(40 MG)  BY MOUTH DAILY 30 capsule 3   rosuvastatin (CRESTOR) 5 MG tablet TAKE 1 TABLET BY MOUTH EVERY DAY 90 tablet 2   tadalafil (CIALIS) 20 MG tablet Take 1 tablet (20 mg total) by mouth daily as needed for erectile dysfunction. 30 tablet 11   traMADol (ULTRAM) 50 MG tablet Take 1 tablet (50 mg total) by mouth every 8 (eight) hours as needed. 60 tablet 0   valACYclovir (VALTREX) 1000 MG tablet Take 1 tablet (1,000 mg total) by mouth 2 (two) times daily as needed. 60 tablet 3   zaleplon (SONATA) 10 MG capsule Take 1 capsule (10 mg total) by mouth at bedtime as needed for sleep. 30 capsule 2   No current facility-administered medications for this visit.    Allergies  Allergen Reactions   Penicillins  Hives   Simvastatin     Myalgias     Past Medical History:  Diagnosis Date   Allergy    Anemia 07/02/2017   Arthritis 12/10/2022   Colitis, ulcerative (HCC) Dx'd 2001   Dr. Rhetta Mura; [facility]--has been in remission since about 2009   Diabetes mellitus without complication (HCC) 57 yrs old   type 2 (dx'd after long courses of prednisone for his UC)   Erectile dysfunction    Family history of tinea corporis 07/25/2012   Gastroesophageal reflux disease 04/05/2022   Hyperlipidemia    Hypertension    Insomnia    Low back pain 08/07/2016   Preventative health care 12/31/2016   Right shoulder pain 12/31/2016   Sinusitis 08/28/2015    Past Surgical History:  Procedure Laterality Date   TRIGGER FINGER RELEASE  10/15/2009   b/l middle fingers    Social History   Socioeconomic History   Marital status: Married    Spouse name: Not on file   Number of children: Not on file   Years of education: Not on file   Highest education level: 12th grade  Occupational History   Not on file  Tobacco Use   Smoking status: Never   Smokeless tobacco: Never  Substance and Sexual Activity   Alcohol use: Yes    Comment: Rare   Drug use: Never   Sexual activity: Yes    Partners: Female    Birth control/protection: None    Comment: lives with wife, does office work follows with  Other Topics Concern   Not on file  Social History Narrative   Married, 59 y/o girl.     Lives in [place], grew up in [place]--relocated to [facility] 2007.   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         Social Determinants of Health   Financial Resource Strain: Low Risk  (03/09/2023)   Overall Financial Resource Strain (CARDIA)    Difficulty of Paying Living Expenses: Not hard at all  Food Insecurity: No Food Insecurity (03/09/2023)   Hunger Vital Sign    Worried About Running Out of Food in the Last Year: Never true    Ran Out of Food in  the Last Year: Never true  Transportation Needs: No Transportation Needs (03/09/2023)   PRAPARE - Administrator, Civil Service (Medical): No    Lack of Transportation (Non-Medical): No  Physical Activity: Insufficiently Active (03/09/2023)   Exercise Vital Sign    Days of Exercise per Week: 2 days    Minutes of Exercise per Session: 20 min  Stress: No Stress  Concern Present (03/09/2023)   [employer] of Occupational Health - Occupational Stress Questionnaire    Feeling of Stress : Only a little  Social Connections: Unknown (03/09/2023)   Social Connection and Isolation Panel [NHANES]    Frequency of Communication with Friends and Family: Once a week    Frequency of Social Gatherings with Friends and Family: Once a week    Attends Religious Services: Patient declined    Database administrator or Organizations: No    Attends Engineer, structural: Not on file    Marital Status: Married  Intimate Partner Violence: Unknown (01/19/2023)   Received from [employer], [facility]   HITS    Physically Hurt: Not on file    Insult or Talk Down To: Not on file    Threaten Physical Harm: Not on file    Scream or Curse: Not on file    Family History  Problem Relation Age of Onset   Diabetes Father    Hyperlipidemia Father    Hypertension Father    Kidney disease Father        Kidney Failure   Heart disease Father 18       MI   Hypertension Sister    Hypertension Brother    Diabetes Paternal Grandfather    Hypertension Brother    Hyperlipidemia Brother    Heart disease Brother        Valve Surgery ?   Diabetes Brother    Hypertension Brother    ADD / ADHD Brother     ROS: no fevers or chills, productive cough, hemoptysis, dysphasia, odynophagia, melena, hematochezia, dysuria, hematuria, rash, seizure activity, orthopnea, PND, pedal edema, claudication. Remaining systems are negative.  Physical Exam:   Blood pressure (!) 148/81, pulse 81, height 5\' 6"   (1.676 m), weight 179 lb 6.4 oz (81.4 kg), SpO2 97%.  General:  Well developed/well nourished in NAD Skin warm/dry Patient not depressed No peripheral clubbing Back-normal HEENT-normal/normal eyelids Neck supple/normal carotid upstroke bilaterally; no bruits; no JVD; no thyromegaly chest - CTA/ normal expansion CV - RRR/normal S1 and S2; no murmurs, rubs or gallops;  PMI nondisplaced Abdomen -NT/ND, no HSM, no mass, + bowel sounds, no bruit 2+ femoral pulses, no bruits Ext-no edema, chords, 2+ DP Neuro-grossly nonfocal  EKG Interpretation Date/Time:  Wednesday September 04 2023 09:01:40 EST Ventricular Rate:  86 PR Interval:  156 QRS Duration:  116 QT Interval:  376 QTC Calculation: 449 R Axis:   27  Text Interpretation: Normal sinus rhythm Normal ECG No previous ECGs available Confirmed by Olga Millers ([id number]) on 09/04/2023 9:02:55 AM    A/P  1 hypertension-patient's blood pressure is elevated today.  However he states it is typically controlled.  I have asked him to track this and we will adjust regimen as needed.  2 hyperlipidemia-continue statin.  If he has calcium documented on his coronary calcium score will advance dose.  3 diabetes mellitus-managed by primary care.  Will arrange calcium score for risk stratification.  4 dyspnea on exertion-he has mild dyspnea with vigorous activities.  He also states his heart was enlarged in the past.  Will repeat echocardiogram.  Olga Millers, MD

## 2023-08-27 DIAGNOSIS — M9903 Segmental and somatic dysfunction of lumbar region: Secondary | ICD-10-CM | POA: Diagnosis not present

## 2023-08-27 DIAGNOSIS — M545 Low back pain, unspecified: Secondary | ICD-10-CM | POA: Diagnosis not present

## 2023-08-27 DIAGNOSIS — M9902 Segmental and somatic dysfunction of thoracic region: Secondary | ICD-10-CM | POA: Diagnosis not present

## 2023-08-27 DIAGNOSIS — M9904 Segmental and somatic dysfunction of sacral region: Secondary | ICD-10-CM | POA: Diagnosis not present

## 2023-08-27 DIAGNOSIS — M9901 Segmental and somatic dysfunction of cervical region: Secondary | ICD-10-CM | POA: Diagnosis not present

## 2023-09-01 DIAGNOSIS — M109 Gout, unspecified: Secondary | ICD-10-CM | POA: Diagnosis not present

## 2023-09-04 ENCOUNTER — Encounter: Payer: Self-pay | Admitting: Cardiology

## 2023-09-04 ENCOUNTER — Ambulatory Visit: Payer: 59 | Attending: Interventional Cardiology | Admitting: Cardiology

## 2023-09-04 VITALS — BP 148/81 | HR 81 | Ht 66.0 in | Wt 179.4 lb

## 2023-09-04 DIAGNOSIS — I10 Essential (primary) hypertension: Secondary | ICD-10-CM

## 2023-09-04 DIAGNOSIS — E7800 Pure hypercholesterolemia, unspecified: Secondary | ICD-10-CM

## 2023-09-04 DIAGNOSIS — R0609 Other forms of dyspnea: Secondary | ICD-10-CM

## 2023-09-04 DIAGNOSIS — Z09 Encounter for follow-up examination after completed treatment for conditions other than malignant neoplasm: Secondary | ICD-10-CM | POA: Diagnosis not present

## 2023-09-04 NOTE — Patient Instructions (Signed)
    Testing/Procedures:  Your physician has requested that you have an echocardiogram. Echocardiography is a painless test that uses sound waves to create images of your heart. It provides your doctor with information about the size and shape of your heart and how well your heart's chambers and valves are working. This procedure takes approximately one hour. There are no restrictions for this procedure. Please do NOT wear cologne, perfume, aftershave, or lotions (deodorant is allowed). Please arrive 15 minutes prior to your appointment time.  Please note: We ask at that you not bring children with you during ultrasound (echo/ vascular) testing. Due to room size and safety concerns, children are not allowed in the ultrasound rooms during exams. Our front office staff cannot provide observation of children in our lobby area while testing is being conducted. An adult accompanying a patient to their appointment will only be allowed in the ultrasound room at the discretion of the ultrasound technician under special circumstances. We apologize for any inconvenience. [facility]-1 ST FLOOR IMAGING DEPARTMENT  CORONARY CALCIUM SCORING CT SCAN [facility]-1 ST FLOOR IMAGING DEPARTMENT   Follow-Up: At [facility], you and your health needs are our priority.  As part of our continuing mission to provide you with exceptional heart care, we have created designated Provider Care Teams.  These Care Teams include your primary Cardiologist (physician) and Advanced Practice Providers (APPs -  Physician Assistants and Nurse Practitioners) who all work together to provide you with the care you need, when you need it.  We recommend signing up for the patient portal called "MyChart".  Sign up information is provided on this After Visit Summary.  MyChart is used to connect with patients for Virtual Visits (Telemedicine).  Patients are able to view lab/test results, encounter notes, upcoming  appointments, etc.  Non-urgent messages can be sent to your provider as well.   To learn more about what you can do with MyChart, go to [email or web address].    Your next appointment:   12 month(s)  Provider:   Olga Millers, MD

## 2023-09-05 ENCOUNTER — Ambulatory Visit: Payer: 59 | Admitting: Physician Assistant

## 2023-09-05 DIAGNOSIS — M9904 Segmental and somatic dysfunction of sacral region: Secondary | ICD-10-CM | POA: Diagnosis not present

## 2023-09-05 DIAGNOSIS — M9902 Segmental and somatic dysfunction of thoracic region: Secondary | ICD-10-CM | POA: Diagnosis not present

## 2023-09-05 DIAGNOSIS — M9901 Segmental and somatic dysfunction of cervical region: Secondary | ICD-10-CM | POA: Diagnosis not present

## 2023-09-05 DIAGNOSIS — M9903 Segmental and somatic dysfunction of lumbar region: Secondary | ICD-10-CM | POA: Diagnosis not present

## 2023-09-05 DIAGNOSIS — M545 Low back pain, unspecified: Secondary | ICD-10-CM | POA: Diagnosis not present

## 2023-09-08 NOTE — Assessment & Plan Note (Signed)
Tolerating statin, encouraged heart healthy diet, avoid trans fats, minimize simple carbs and saturated fats. Increase exercise as tolerated 

## 2023-09-08 NOTE — Assessment & Plan Note (Signed)
Stable and controlled

## 2023-09-08 NOTE — Assessment & Plan Note (Signed)
Well controlled, no changes to meds. Encouraged heart healthy diet such as the DASH diet and exercise as tolerated.  °

## 2023-09-08 NOTE — Assessment & Plan Note (Signed)
Hgba1ccontinue to monitor, minimize simple carbs. Increase exercise as tolerated. Continue current meds

## 2023-09-08 NOTE — Assessment & Plan Note (Signed)
Hydrate and monitor 

## 2023-09-08 NOTE — Assessment & Plan Note (Signed)
Avoid offending foods, start probiotics. Do not eat large meals in late evening and consider raising head of bed.

## 2023-09-08 NOTE — Assessment & Plan Note (Signed)
Encouraged good sleep hygiene such as dark, quiet room. No blue/green glowing lights such as computer screens in bedroom. No alcohol or stimulants in evening. Cut down on caffeine as able. Regular exercise is helpful but not just prior to bed time.

## 2023-09-10 ENCOUNTER — Other Ambulatory Visit (HOSPITAL_BASED_OUTPATIENT_CLINIC_OR_DEPARTMENT_OTHER): Payer: Self-pay

## 2023-09-10 ENCOUNTER — Ambulatory Visit (INDEPENDENT_AMBULATORY_CARE_PROVIDER_SITE_OTHER): Payer: 59 | Admitting: Family Medicine

## 2023-09-10 ENCOUNTER — Ambulatory Visit (HOSPITAL_BASED_OUTPATIENT_CLINIC_OR_DEPARTMENT_OTHER)
Admission: RE | Admit: 2023-09-10 | Discharge: 2023-09-10 | Disposition: A | Discharge status: Home / Self Care | Payer: 59 | Source: Ambulatory Visit | Attending: Family Medicine | Admitting: Family Medicine

## 2023-09-10 ENCOUNTER — Other Ambulatory Visit: Payer: Self-pay | Admitting: Family Medicine

## 2023-09-10 VITALS — BP 128/62 | HR 85 | Temp 98.0°F | Resp 18 | Ht 66.0 in | Wt 179.6 lb

## 2023-09-10 DIAGNOSIS — D539 Nutritional anemia, unspecified: Secondary | ICD-10-CM | POA: Diagnosis not present

## 2023-09-10 DIAGNOSIS — E782 Mixed hyperlipidemia: Secondary | ICD-10-CM

## 2023-09-10 DIAGNOSIS — G4700 Insomnia, unspecified: Secondary | ICD-10-CM

## 2023-09-10 DIAGNOSIS — K219 Gastro-esophageal reflux disease without esophagitis: Secondary | ICD-10-CM | POA: Diagnosis not present

## 2023-09-10 DIAGNOSIS — K5180 Other ulcerative colitis without complications: Secondary | ICD-10-CM | POA: Diagnosis not present

## 2023-09-10 DIAGNOSIS — M79671 Pain in right foot: Secondary | ICD-10-CM | POA: Diagnosis not present

## 2023-09-10 DIAGNOSIS — N529 Male erectile dysfunction, unspecified: Secondary | ICD-10-CM | POA: Diagnosis not present

## 2023-09-10 DIAGNOSIS — E1165 Type 2 diabetes mellitus with hyperglycemia: Secondary | ICD-10-CM | POA: Diagnosis not present

## 2023-09-10 DIAGNOSIS — I10 Essential (primary) hypertension: Secondary | ICD-10-CM

## 2023-09-10 DIAGNOSIS — Z23 Encounter for immunization: Secondary | ICD-10-CM | POA: Diagnosis not present

## 2023-09-10 DIAGNOSIS — M25571 Pain in right ankle and joints of right foot: Secondary | ICD-10-CM | POA: Diagnosis not present

## 2023-09-10 DIAGNOSIS — R252 Cramp and spasm: Secondary | ICD-10-CM

## 2023-09-10 DIAGNOSIS — I7090 Unspecified atherosclerosis: Secondary | ICD-10-CM | POA: Diagnosis not present

## 2023-09-10 LAB — CBC WITH DIFFERENTIAL/PLATELET
Basophils Absolute: 0 10*3/uL (ref 0.0–0.1)
Basophils Relative: 0.4 % (ref 0.0–3.0)
Eosinophils Absolute: 0.1 10*3/uL (ref 0.0–0.7)
Eosinophils Relative: 1.6 % (ref 0.0–5.0)
HCT: 36.4 % — ABNORMAL LOW (ref 39.0–52.0)
Hemoglobin: 12.7 g/dL — ABNORMAL LOW (ref 13.0–17.0)
Lymphocytes Relative: 15.4 % (ref 12.0–46.0)
Lymphs Abs: 0.8 10*3/uL (ref 0.7–4.0)
MCHC: 34.8 g/dL (ref 30.0–36.0)
MCV: 99.3 fL (ref 78.0–100.0)
Monocytes Absolute: 0.4 10*3/uL (ref 0.1–1.0)
Monocytes Relative: 7 % (ref 3.0–12.0)
Neutro Abs: 4 10*3/uL (ref 1.4–7.7)
Neutrophils Relative %: 75.6 % (ref 43.0–77.0)
Platelets: 212 10*3/uL (ref 150.0–400.0)
RBC: 3.66 Mil/uL — ABNORMAL LOW (ref 4.22–5.81)
RDW: 15.9 % — ABNORMAL HIGH (ref 11.5–15.5)
WBC: 5.3 10*3/uL (ref 4.0–10.5)

## 2023-09-10 LAB — MICROALBUMIN / CREATININE URINE RATIO
Creatinine,U: 71.4 mg/dL
Microalb Creat Ratio: 46.1 mg/g — ABNORMAL HIGH (ref 0.0–30.0)
Microalb, Ur: 32.9 mg/dL — ABNORMAL HIGH (ref 0.0–1.9)

## 2023-09-10 LAB — TSH: TSH: 1.37 u[IU]/mL (ref 0.35–5.50)

## 2023-09-10 LAB — LIPID PANEL
Cholesterol: 139 mg/dL (ref 0–200)
HDL: 39.2 mg/dL (ref >39.00)
LDL Cholesterol: 76 mg/dL (ref 0–99)
NonHDL: 99.95
Total CHOL/HDL Ratio: 4
Triglycerides: 120 mg/dL (ref 0.0–149.0)
VLDL: 24 mg/dL (ref 0.0–40.0)

## 2023-09-10 LAB — COMPREHENSIVE METABOLIC PANEL
ALT: 32 U/L (ref 0–53)
AST: 31 U/L (ref 0–37)
Albumin: 4.2 g/dL (ref 3.5–5.2)
Alkaline Phosphatase: 57 U/L (ref 39–117)
BUN: 9 mg/dL (ref 6–23)
CO2: 26 meq/L (ref 19–32)
Calcium: 9 mg/dL (ref 8.4–10.5)
Chloride: 105 meq/L (ref 96–112)
Creatinine, Ser: 0.77 mg/dL (ref 0.40–1.50)
GFR: 98.41 mL/min (ref >60.00)
Glucose, Bld: 188 mg/dL — ABNORMAL HIGH (ref 70–99)
Potassium: 4.3 meq/L (ref 3.5–5.1)
Sodium: 138 meq/L (ref 135–145)
Total Bilirubin: 1 mg/dL (ref 0.2–1.2)
Total Protein: 6.4 g/dL (ref 6.0–8.3)

## 2023-09-10 LAB — HEMOGLOBIN A1C: Hgb A1c MFr Bld: 9.4 % — ABNORMAL HIGH (ref 4.6–6.5)

## 2023-09-10 LAB — SEDIMENTATION RATE: Sed Rate: 4 mm/h (ref 0–20)

## 2023-09-10 LAB — TESTOSTERONE: Testosterone: 384.24 ng/dL (ref 300.00–890.00)

## 2023-09-10 LAB — URIC ACID: Uric Acid, Serum: 2.8 mg/dL — ABNORMAL LOW (ref 4.0–7.8)

## 2023-09-10 LAB — MAGNESIUM: Magnesium: 1.5 mg/dL (ref 1.5–2.5)

## 2023-09-10 LAB — HIGH SENSITIVITY CRP: CRP, High Sensitivity: 2.64 mg/L (ref 0.000–5.000)

## 2023-09-10 LAB — VITAMIN B12: Vitamin B-12: 465 pg/mL (ref 211–911)

## 2023-09-10 MED ORDER — TIRZEPATIDE 2.5 MG/0.5ML ~~LOC~~ SOAJ
2.5000 mg | SUBCUTANEOUS | 1 refills | Status: DC
Start: 2023-09-10 — End: 2023-10-30
  Filled 2023-09-10: qty 2, 28d supply, fill #0

## 2023-09-10 MED ORDER — TRULICITY 3 MG/0.5ML ~~LOC~~ SOAJ
3.0000 mg | SUBCUTANEOUS | 5 refills | Status: DC
  Filled 2023-09-10: qty 2, 28d supply, fill #0
  Filled 2023-10-03: qty 2, 28d supply, fill #1

## 2023-09-10 MED ORDER — IMIQUIMOD 5 % EX CREA: TOPICAL_CREAM | CUTANEOUS | 2 refills | Status: AC

## 2023-09-10 MED ORDER — COLCHICINE 0.6 MG PO TABS: ORAL_TABLET | ORAL | 1 refills | Status: AC

## 2023-09-10 NOTE — Patient Instructions (Addendum)
Shingrix is the new shingles shot, 2 shots over 2-6 months, confirm coverage with insurance and document, then can return here for shots with nurse appt or at pharmacy   COVID booster  RSV, Respiratory Syncitial Virus, Arexvy vaccine at pharmacy   Tetanus if injured or 2028    Gout  Gout is a condition that causes painful swelling of the joints. Gout is a type of inflammation of the joints (arthritis). This condition is caused by having too much uric acid in the body. Uric acid is a chemical that forms when the body breaks down substances called purines. Purines are important for building body proteins. When the body has too much uric acid, sharp crystals can form and build up inside the joints. This causes pain and swelling. Gout attacks can happen quickly and may be very painful (acute gout). Over time, the attacks can affect more joints and become more frequent (chronic gout). Gout can also cause uric acid to build up under the skin and inside the kidneys. What are the causes? This condition is caused by too much uric acid in your blood. This can happen because: Your kidneys do not remove enough uric acid from your blood. This is the most common cause. Your body makes too much uric acid. This can happen with some cancers and cancer treatments. It can also occur if your body is breaking down too many red blood cells (hemolytic anemia). You eat too many foods that are high in purines. These foods include organ meats and some seafood. Alcohol, especially beer, is also high in purines. A gout attack may be triggered by trauma or stress. What increases the risk? The following factors may make you more likely to develop this condition: Having a family history of gout. Being male and middle-aged. Being male and having gone through menopause. Taking certain medicines, including aspirin, cyclosporine, diuretics, levodopa, and niacin. Having an organ transplant. Having certain conditions, such  as: Being obese. Lead poisoning. Kidney disease. A skin condition called psoriasis. Other factors include: Losing weight too quickly. Being dehydrated. Frequently drinking alcohol, especially beer. Frequently drinking beverages that are sweetened with a type of sugar called fructose. What are the signs or symptoms? An attack of acute gout happens quickly. It usually occurs in just one joint. The most common place is the big toe. Attacks often start at night. Other joints that may be affected include joints of the feet, ankle, knee, fingers, wrist, or elbow. Symptoms of this condition may include: Severe pain. Warmth. Swelling. Stiffness. Tenderness. The affected joint may be very painful to touch. Shiny, red, or purple skin. Chills and fever. Chronic gout may cause symptoms more frequently. More joints may be involved. You may also have white or yellow lumps (tophi) on your hands or feet or in other areas near your joints. How is this diagnosed? This condition is diagnosed based on your symptoms, your medical history, and a physical exam. You may have tests, such as: Blood tests to measure uric acid levels. Removal of joint fluid with a thin needle (aspiration) to look for uric acid crystals. X-rays to look for joint damage. How is this treated? Treatment for this condition has two phases: treating an acute attack and preventing future attacks. Acute gout treatment may include medicines to reduce pain and swelling, including: NSAIDs, such as ibuprofen. Steroids. These are strong anti-inflammatory medicines that can be taken by mouth (orally) or injected into a joint. Colchicine. This medicine relieves pain and swelling when it is  taken soon after an attack. It can be given by mouth or through an IV. Preventive treatment may include: Daily use of smaller doses of NSAIDs or colchicine. Use of a medicine that reduces uric acid levels in your blood, such as allopurinol. Changes to your  diet. You may need to see a dietitian about what to eat and drink to prevent gout. Follow these instructions at home: During a gout attack  If directed, put ice on the affected area. To do this: Put ice in a plastic bag. Place a towel between your skin and the bag. Leave the ice on for 20 minutes, 2-3 times a day. Remove the ice if your skin turns bright red. This is very important. If you cannot feel pain, heat, or cold, you have a greater risk of damage to the area. Raise (elevate) the affected joint above the level of your heart as often as possible. Rest the joint as much as possible. If the affected joint is in your leg, you may be given crutches to use. Follow instructions from your health care provider about eating or drinking restrictions. Avoiding future gout attacks Follow a low-purine diet as told by your dietitian or health care provider. Avoid foods and drinks that are high in purines, including liver, kidney, anchovies, asparagus, herring, mushrooms, mussels, and beer. Maintain a healthy weight or lose weight if you are overweight. If you want to lose weight, talk with your health care provider. Do not lose weight too quickly. Start or maintain an exercise program as told by your health care provider. Eating and drinking Avoid drinking beverages that contain fructose. Drink enough fluids to keep your urine pale yellow. If you drink alcohol: Limit how much you have to: 0-1 drink a day for women who are not pregnant. 0-2 drinks a day for men. Know how much alcohol is in a drink. In the [place] one drink equals one 12 oz bottle of beer (355 mL), one 5 oz glass of wine (148 mL), or one 1 oz glass of hard liquor (44 mL). General instructions Take over-the-counter and prescription medicines only as told by your health care provider. Ask your health care provider if the medicine prescribed to you requires you to avoid driving or using machinery. Return to your normal activities as  told by your health care provider. Ask your health care provider what activities are safe for you. Keep all follow-up visits. This is important. Where to find more information [employer]: [email or web address].[email or web address]://www.myers.net/ Contact a health care provider if you have: Another gout attack. Continuing symptoms of a gout attack after 10 days of treatment. Side effects from your medicines. Chills or a fever. Burning pain when you urinate. Pain in your lower back or abdomen. Get help right away if you: Have severe or uncontrolled pain. Cannot urinate. Summary Gout is painful swelling of the joints caused by having too much uric acid in the body. The most common site for gout to occur is in the big toe, but it can affect other joints in the body. Medicines and dietary changes can help to prevent and treat gout attacks. This information is not intended to replace advice given to you by your health care provider. Make sure you discuss any questions you have with your health care provider. Document Revised: 07/05/2021 Document Reviewed: 07/05/2021 Elsevier Patient Education  2024 [employer].

## 2023-09-11 ENCOUNTER — Encounter: Payer: Self-pay | Admitting: Family Medicine

## 2023-09-11 ENCOUNTER — Other Ambulatory Visit: Payer: Self-pay | Admitting: Emergency Medicine

## 2023-09-11 ENCOUNTER — Other Ambulatory Visit (HOSPITAL_BASED_OUTPATIENT_CLINIC_OR_DEPARTMENT_OTHER): Payer: Self-pay

## 2023-09-11 DIAGNOSIS — E1165 Type 2 diabetes mellitus with hyperglycemia: Secondary | ICD-10-CM

## 2023-09-11 NOTE — Progress Notes (Signed)
Subjective:    Patient ID: Curtis Kennedy, male    DOB: 08-Aug-1966, 57 y.o.   MRN: [id number]  No chief complaint on file.   HPI Discussed the use of AI scribe software for clinical note transcription with the patient, who gave verbal consent to proceed.  History of Present Illness   The patient, a known diabetic and hypertensive, presents with a recent diagnosis of gout. The symptoms started with foot pain on October 13th, which progressed to redness, heat, and severe pain by October 17th. The patient sought treatment at urgent care on October 17th and was prescribed a 5-day course of prednisone and Mobic. The patient reports some improvement but continues to experience pain. The patient also mentions a persistent issue with a toe that has been bothering him for about six months to a year. The patient's wife corroborates the patient's account and provides additional information about the patient's symptoms and treatment. The patient also has a history of erectile dysfunction, which is being managed with medication.        Past Medical History:  Diagnosis Date  . Allergy   . Anemia 07/02/2017  . Arthritis 12/10/2022  . Colitis, ulcerative (HCC) Dx'd 2001   Dr. Rhetta Mura; [facility]--has been in remission since about 2009  . Diabetes mellitus without complication (HCC) 57 yrs old   type 2 (dx'd after long courses of prednisone for his UC)  . Erectile dysfunction   . Family history of tinea corporis 07/25/2012  . Gastroesophageal reflux disease 04/05/2022  . Hyperlipidemia   . Hypertension   . Insomnia   . Low back pain 08/07/2016  . Preventative health care 12/31/2016  . Right shoulder pain 12/31/2016  . Sinusitis 08/28/2015    Past Surgical History:  Procedure Laterality Date  . TRIGGER FINGER RELEASE  10/15/2009   b/l middle fingers    Family History  Problem Relation Age of Onset  . Diabetes Father   . Hyperlipidemia Father   . Hypertension Father   . Kidney disease  Father        Kidney Failure  . Heart disease Father 4       MI  . Hypertension Sister   . Hypertension Brother   . Diabetes Paternal Grandfather   . Hypertension Brother   . Hyperlipidemia Brother   . Heart disease Brother        Valve Surgery ?  . Diabetes Brother   . Hypertension Brother   . ADD / ADHD Brother     Social History   Socioeconomic History  . Marital status: Married    Spouse name: Not on file  . Number of children: Not on file  . Years of education: Not on file  . Highest education level: Some college, no degree  Occupational History  . Not on file  Tobacco Use  . Smoking status: Never  . Smokeless tobacco: Never  Substance and Sexual Activity  . Alcohol use: Yes    Comment: Rare  . Drug use: Never  . Sexual activity: Yes    Partners: Female    Birth control/protection: None    Comment: lives with wife, does office work follows with  Other Topics Concern  . Not on file  Social History Narrative   Married, 59 y/o girl.     Lives in [place], grew up in [place]--relocated to [facility] 2007.   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   Works out 1.5 hours 3 days per week: cardio  and wts.         Social Determinants of Health   Financial Resource Strain: Low Risk  (09/09/2023)   Overall Financial Resource Strain (CARDIA)   . Difficulty of Paying Living Expenses: Not hard at all  Food Insecurity: No Food Insecurity (09/09/2023)   Hunger Vital Sign   . Worried About Programme researcher, broadcasting/film/video in the Last Year: Never true   . Ran Out of Food in the Last Year: Never true  Transportation Needs: No Transportation Needs (09/09/2023)   PRAPARE - Transportation   . Lack of Transportation (Medical): No   . Lack of Transportation (Non-Medical): No  Physical Activity: Unknown (09/09/2023)   Exercise Vital Sign   . Days of Exercise per Week: 5 days   . Minutes of Exercise per Session: Patient declined  Stress: No Stress Concern Present (09/09/2023)    [employer] of Occupational Health - Occupational Stress Questionnaire   . Feeling of Stress : Only a little  Social Connections: Unknown (09/09/2023)   Social Connection and Isolation Panel [NHANES]   . Frequency of Communication with Friends and Family: Three times a week   . Frequency of Social Gatherings with Friends and Family: Once a week   . Attends Religious Services: Patient declined   . Active Member of Clubs or Organizations: No   . Attends Banker Meetings: Not on file   . Marital Status: Married  Catering manager Violence: Unknown (01/19/2023)   [facility]   HITS   . Physically Hurt: Not on file   . Insult or Talk Down To: Not on file   . Threaten Physical Harm: Not on file   . Scream or Curse: Not on file    Outpatient Medications Prior to Visit  Medication Sig Dispense Refill  . albuterol (VENTOLIN HFA) 108 (90 Base) MCG/ACT inhaler TAKE 2 PUFFS BY MOUTH EVERY 6 HOURS AS NEEDED FOR WHEEZE OR SHORTNESS OF BREATH 18 each 2  . azaTHIOprine (IMURAN) 50 MG tablet TAKE 4 AND 1/2 TAB BY MOUTH EVERY DAY 405 tablet 1  . balsalazide (COLAZAL) 750 MG capsule TAKE 3 CAPSULES(2250 MG) BY MOUTH THREE TIMES DAILY 270 capsule 2  . Continuous Blood Gluc Receiver (DEXCOM G6 RECEIVER) DEVI Use for continuous blood glucose monitoring 1 each 0  . Continuous Blood Gluc Sensor (DEXCOM G6 SENSOR) MISC Use for continuous blood glucose monitoring.  Replace every 10 days. 4 each 0  . Continuous Blood Gluc Transmit (DEXCOM G6 TRANSMITTER) MISC Use for continuous blood glucose monitoring.  Replace every 90 days 1 each 1  . Continuous Glucose Sensor (DEXCOM G7 SENSOR) MISC Check blood sugar daily 2 each 5  . Continuous Glucose Sensor (FREESTYLE LIBRE 3 SENSOR) MISC Place 1 sensor on the skin every 14 days. Use to check glucose continuously 1 each 0  . famotidine (PEPCID) 40 MG tablet TAKE 1 TABLET BY MOUTH EVERY DAY 30 tablet 2  . fenofibrate 160 MG  tablet TAKE 1 TABLET BY MOUTH EVERY DAY 30 tablet 2  . fluticasone (FLONASE) 50 MCG/ACT nasal spray Place 2 sprays into both nostrils daily. 16 g 5  . fluticasone (FLOVENT HFA) 110 MCG/ACT inhaler Inhale 2 puffs into the lungs 2 (two) times daily. 1 Inhaler 2  . Insulin Glargine (BASAGLAR KWIKPEN) 100 UNIT/ML Inject 50 Units into the skin daily. 15 mL 1  . Insulin Pen Needle (BD PEN NEEDLE NANO 2ND GEN) 32G X 4 MM MISC USE 1 NEEDLE DAILY  AS DIRECTED 100 each 11  . Lancets (ONETOUCH DELICA PLUS LANCET30G) MISC USE AS DIRECTED TWICE DAILY TO CHECK BLOOD SUGAR 100 each 0  . levocetirizine (XYZAL) 5 MG tablet TAKE 1 TABLET BY MOUTH EVERY DAY IN THE EVENING 30 tablet 2  . losartan (COZAAR) 100 MG tablet TAKE 1 TABLET BY MOUTH EVERY DAY 30 tablet 2  . meloxicam (MOBIC) 15 MG tablet Take 15 mg by mouth daily.    . metFORMIN (GLUCOPHAGE) 1000 MG tablet TAKE 1 TABLET BY MOUTH TWICE A DAY WITH FOOD 60 tablet 2  . methocarbamol (ROBAXIN) 500 MG tablet Take 1 tablet (500 mg total) by mouth every 6 (six) hours as needed for muscle spasms. 60 tablet 2  . montelukast (SINGULAIR) 10 MG tablet TAKE 1 TABLET BY MOUTH EVERYDAY AT BEDTIME 30 tablet 5  . olopatadine (PATANOL) 0.1 % ophthalmic solution Place 1 drop into both eyes 2 (two) times daily. 5 mL 3  . rosuvastatin (CRESTOR) 5 MG tablet TAKE 1 TABLET BY MOUTH EVERY DAY 90 tablet 2  . tadalafil (CIALIS) 20 MG tablet Take 1 tablet (20 mg total) by mouth daily as needed for erectile dysfunction. 30 tablet 11  . valACYclovir (VALTREX) 1000 MG tablet Take 1 tablet (1,000 mg total) by mouth 2 (two) times daily as needed. 60 tablet 3  . Dulaglutide (TRULICITY) 3 MG/0.5ML SOPN Inject 3 mg into the skin once a week. 6 mL 1  . imiquimod (ALDARA) 5 % cream Apply topically 3 (three) times a week. 12 each 0  . amitriptyline (ELAVIL) 10 MG tablet TAKE 1/2 TO 2 TABLETS(5 TO 20 MG) BY MOUTH AT BEDTIME AS NEEDED FOR SLEEP 40 tablet 1  . omeprazole (PRILOSEC) 40 MG capsule TAKE  1 CAPSULE(40 MG) BY MOUTH DAILY 30 capsule 3  . traMADol (ULTRAM) 50 MG tablet Take 1 tablet (50 mg total) by mouth every 8 (eight) hours as needed. 60 tablet 0  . zaleplon (SONATA) 10 MG capsule Take 1 capsule (10 mg total) by mouth at bedtime as needed for sleep. 30 capsule 2   No facility-administered medications prior to visit.    Allergies  Allergen Reactions  . Penicillins Hives  . Simvastatin     Myalgias    Review of Systems  Constitutional:  Negative for fever and malaise/fatigue.  HENT:  Negative for congestion.   Eyes:  Negative for blurred vision.  Respiratory:  Negative for shortness of breath.   Cardiovascular:  Negative for chest pain, palpitations and leg swelling.  Gastrointestinal:  Negative for abdominal pain, blood in stool and nausea.  Genitourinary:  Negative for dysuria and frequency.  Musculoskeletal:  Positive for joint pain. Negative for falls.  Skin:  Negative for rash.  Neurological:  Negative for dizziness, loss of consciousness and headaches.  Endo/Heme/Allergies:  Negative for environmental allergies.  Psychiatric/Behavioral:  Negative for depression. The patient is not nervous/anxious.       Objective:    Physical Exam Vitals reviewed.  Constitutional:      Appearance: Normal appearance. He is not ill-appearing.  HENT:     Head: Normocephalic and atraumatic.     Nose: Nose normal.  Eyes:     Conjunctiva/sclera: Conjunctivae normal.  Cardiovascular:     Rate and Rhythm: Normal rate.     Pulses: Normal pulses.     Heart sounds: Normal heart sounds. No murmur heard. Pulmonary:     Effort: Pulmonary effort is normal.     Breath sounds: Normal breath sounds. No  wheezing.  Abdominal:     Palpations: Abdomen is soft. There is no mass.     Tenderness: There is no abdominal tenderness.  Musculoskeletal:     Cervical back: Normal range of motion.     Right lower leg: No edema.     Left lower leg: No edema.  Skin:    General: Skin is warm  and dry.     Findings: Erythema present.     Comments: Mild erythema caudal surface of right foot near ankle  Neurological:     General: No focal deficit present.     Mental Status: He is alert and oriented to person, place, and time.  Psychiatric:        Mood and Affect: Mood normal.   BP 128/62 (BP Location: Right Arm, Patient Position: Sitting, Cuff Size: Normal)   Pulse 85   Temp 98 F (36.7 C) (Oral)   Resp 18   Ht 5\' 6"  (1.676 m)   Wt 179 lb 9.6 oz (81.5 kg)   SpO2 95%   BMI 28.99 kg/m  Wt Readings from Last 3 Encounters:  09/10/23 179 lb 9.6 oz (81.5 kg)  09/04/23 179 lb 6.4 oz (81.4 kg)  03/12/23 178 lb (80.7 kg)    Diabetic Foot Exam - Simple   Simple Foot Form Diabetic Foot exam was performed with the following findings: Yes 09/10/2023 11:42 AM  Visual Inspection No deformities, no ulcerations, no other skin breakdown bilaterally: Yes Sensation Testing Intact to touch and monofilament testing bilaterally: Yes Pulse Check Posterior Tibialis and Dorsalis pulse intact bilaterally: Yes Comments    Lab Results  Component Value Date   WBC 5.3 09/10/2023   HGB 12.7 (L) 09/10/2023   HCT 36.4 (L) 09/10/2023   PLT 212.0 09/10/2023   GLUCOSE 188 (H) 09/10/2023   CHOL 139 09/10/2023   TRIG 120.0 09/10/2023   HDL 39.20 09/10/2023   LDLCALC 76 09/10/2023   ALT 32 09/10/2023   AST 31 09/10/2023   NA 138 09/10/2023   K 4.3 09/10/2023   CL 105 09/10/2023   CREATININE 0.77 09/10/2023   BUN 9 09/10/2023   CO2 26 09/10/2023   TSH 1.37 09/10/2023   PSA 1.05 12/10/2022   HGBA1C 9.4 (H) 09/10/2023   MICROALBUR 32.9 (H) 09/10/2023    Lab Results  Component Value Date   TSH 1.37 09/10/2023   Lab Results  Component Value Date   WBC 5.3 09/10/2023   HGB 12.7 (L) 09/10/2023   HCT 36.4 (L) 09/10/2023   MCV 99.3 09/10/2023   PLT 212.0 09/10/2023   Lab Results  Component Value Date   NA 138 09/10/2023   K 4.3 09/10/2023   CO2 26 09/10/2023   GLUCOSE 188 (H)  09/10/2023   BUN 9 09/10/2023   CREATININE 0.77 09/10/2023   BILITOT 1.0 09/10/2023   ALKPHOS 57 09/10/2023   AST 31 09/10/2023   ALT 32 09/10/2023   PROT 6.4 09/10/2023   ALBUMIN 4.2 09/10/2023   CALCIUM 9.0 09/10/2023   GFR 98.41 09/10/2023   Lab Results  Component Value Date   CHOL 139 09/10/2023   Lab Results  Component Value Date   HDL 39.20 09/10/2023   Lab Results  Component Value Date   LDLCALC 76 09/10/2023   Lab Results  Component Value Date   TRIG 120.0 09/10/2023   Lab Results  Component Value Date   CHOLHDL 4 09/10/2023   Lab Results  Component Value Date   HGBA1C 9.4 (H) 09/10/2023  Assessment & Plan:  Other ulcerative colitis without complication (HCC) Assessment & Plan: Stable and controlled   Gastroesophageal reflux disease, unspecified whether esophagitis present Assessment & Plan: Avoid offending foods, start probiotics. Do not eat large meals in late evening and consider raising head of bed.     Muscle cramps Assessment & Plan: Hydrate and monitor   Orders: -     Magnesium  Mixed hyperlipidemia Assessment & Plan: Tolerating statin, encouraged heart healthy diet, avoid trans fats, minimize simple carbs and saturated fats. Increase exercise as tolerated  Orders: -     Lipid panel  Primary hypertension Assessment & Plan: Well controlled, no changes to meds. Encouraged heart healthy diet such as the DASH diet and exercise as tolerated.    Orders: -     CBC with Differential/Platelet -     Comprehensive metabolic panel -     TSH  Type 2 diabetes mellitus with hyperglycemia, unspecified whether long term insulin use (HCC) Assessment & Plan: Hgba1ccontinue to monitor, minimize simple carbs. Increase exercise as tolerated. Continue current meds   Orders: -     Hemoglobin A1c -     Microalbumin / creatinine urine ratio  Insomnia, unspecified type Assessment & Plan: Encouraged good sleep hygiene such as dark, quiet room.  No blue/green glowing lights such as computer screens in bedroom. No alcohol or stimulants in evening. Cut down on caffeine as able. Regular exercise is helpful but not just prior to bed time.       Right foot pain -     DG Foot Complete Right; Future -     Uric acid -     Sedimentation rate -     High sensitivity CRP  Macrocytic anemia -     Vitamin B12  Erectile dysfunction, unspecified erectile dysfunction type -     Testosterone  Need for influenza vaccination -     Flu vaccine trivalent PF, 6mos and older(Flulaval,Afluria,Fluarix,Fluzone)  Other orders -     Colchicine; 2 tabs po once then 1 tab po every 2 hours to max of 6 tabs in 24 hours or until pain relief or side effects then 1 tab po daily x 3 weeks  Dispense: 28 tablet; Refill: 1 -     Imiquimod; Apply topically 3 (three) times a week.  Dispense: 24 each; Refill: 2 -     Trulicity; Inject 3 mg into the skin once a week.  Dispense: 6 mL; Refill: 5    Assessment and Plan    Gout Recent flare with persistent pain despite treatment with prednisone and Mobic. No current signs of inflammation. Discussed the role of uric acid and dehydration in gout flares. -Order blood work including uric acid, sed rate, and CRP. -Consider foot x-ray to rule out trauma. -Prescribe Colchicine for acute flares, 2 tablets at onset then 1 tablet every 2 hours to a max of 6 tablets in 24 hours. -Encourage hydration, 10 ounces of water every hour or two.  Toe Pain Chronic pain in toe, unclear etiology. -Observe response to Colchicine treatment for potential gout involvement.  Hypertension Recent blood pressure reading of 156/87. -Continue current treatment and monitor.  General Health Maintenance -Consider RSV vaccination due to diabetes. -Consider Shingles vaccination due to recent study linking shingles outbreaks to higher risk of dementia. -Continue regular exercise, clean diet, and hydration for overall health  maintenance.  Follow-up -Check blood work results. -Consider increasing Trulicity dose if sugars are elevated. -Consider referral to urology if testosterone is  low. -Consider referral to neuropsychiatry for behavioral disturbances.         Danise Edge, MD

## 2023-09-18 ENCOUNTER — Ambulatory Visit: Payer: 59 | Admitting: Urology

## 2023-09-18 ENCOUNTER — Encounter: Payer: Self-pay | Admitting: Urology

## 2023-09-18 VITALS — BP 154/100 | HR 88 | Ht 66.0 in | Wt 178.0 lb

## 2023-09-18 DIAGNOSIS — N529 Male erectile dysfunction, unspecified: Secondary | ICD-10-CM

## 2023-09-18 MED ORDER — VARDENAFIL HCL 20 MG PO TABS: 20.0000 mg | ORAL_TABLET | Freq: Every day | ORAL | 11 refills | Status: DC | PRN

## 2023-09-18 NOTE — Progress Notes (Signed)
Assessment: 1. Organic impotence     Plan: Rx for vardenafil 20 mg prn. PSA with annual PE labs Return to office in 1 year  Chief Complaint:  Chief Complaint  Patient presents with   Erectile Dysfunction    History of Present Illness:  Curtis Kennedy is a 57 y.o. male who is seen for continued evaluation of erectile dysfunction.  He was previously managed by Dr. Ileene Hutchinson in [place].  He has a 3-4-year history of erectile dysfunction.  He was initially managed with sildenafil with good results.  He noticed a decrease in the effectiveness of the sildenafil 100 mg.  He was changed to tadalafil 20 mg as needed.  He was last seen in April 2023.  He was given a trial of tadalafil 5 mg daily.  PSA from 6/22: 0.81  PSA from 1/24: 1.05  At his visit in 1/24, he continued to take tadalafil 5 mg daily.  He wanted to resume using the tadalafil 20 mg as needed as he felt this worked better for him and does not want to take a medication daily.  He continued to have difficulty achieving and maintaining his erections without medication.  No pain or curvature with erection.  He was not having any lower urinary tract symptoms.  No dysuria or gross hematuria.  He returns today for follow-up.  He continues to use tadalafil 20 mg as needed.  He feels like this does improve his erections but is not completely satisfactory.  No side effects.  No new urinary symptoms.  Portions of the above documentation were copied from a prior visit for review purposes only.   Past Medical History:  Past Medical History:  Diagnosis Date   Allergy    Anemia 07/02/2017   Arthritis 12/10/2022   Colitis, ulcerative (HCC) Dx'd 2001   Dr. Rhetta Mura; [facility]--has been in remission since about 2009   Diabetes mellitus without complication (HCC) 57 yrs old   type 2 (dx'd after long courses of prednisone for his UC)   Erectile dysfunction    Family history of tinea corporis 07/25/2012   Gastroesophageal reflux  disease 04/05/2022   Hyperlipidemia    Hypertension    Insomnia    Low back pain 08/07/2016   Preventative health care 12/31/2016   Right shoulder pain 12/31/2016   Sinusitis 08/28/2015    Past Surgical History:  Past Surgical History:  Procedure Laterality Date   TRIGGER FINGER RELEASE  10/15/2009   b/l middle fingers    Allergies:  Allergies  Allergen Reactions   Penicillins Hives   Simvastatin     Myalgias    Family History:  Family History  Problem Relation Age of Onset   Diabetes Father    Hyperlipidemia Father    Hypertension Father    Kidney disease Father        Kidney Failure   Heart disease Father 74       MI   Hypertension Sister    Hypertension Brother    Diabetes Paternal Grandfather    Hypertension Brother    Hyperlipidemia Brother    Heart disease Brother        Valve Surgery ?   Diabetes Brother    Hypertension Brother    ADD / ADHD Brother     Social History:  Social History   Tobacco Use   Smoking status: Never   Smokeless tobacco: Never  Substance Use Topics   Alcohol use: Yes    Comment: Rare  Drug use: Never    ROS: Constitutional:  Negative for fever, chills, weight loss CV: Negative for chest pain, previous MI, hypertension Respiratory:  Negative for shortness of breath, wheezing, sleep apnea, frequent cough GI:  Negative for nausea, vomiting, bloody stool, GERD   Physical exam: BP (!) 154/100   Pulse 88   Ht 5\' 6"  (1.676 m)   Wt 178 lb (80.7 kg)   BMI 28.73 kg/m  GENERAL APPEARANCE:  Well appearing, well developed, well nourished, NAD HEENT:  Atraumatic, normocephalic, oropharynx clear NECK:  Supple without lymphadenopathy or thyromegaly ABDOMEN:  Soft, non-tender, no masses EXTREMITIES:  Moves all extremities well, without clubbing, cyanosis, or edema NEUROLOGIC:  Alert and oriented x 3, normal gait, CN II-XII grossly intact MENTAL STATUS:  appropriate BACK:  Non-tender to palpation, No CVAT SKIN:  Warm, dry,  and intact  Results: None

## 2023-09-19 DIAGNOSIS — M9901 Segmental and somatic dysfunction of cervical region: Secondary | ICD-10-CM | POA: Diagnosis not present

## 2023-09-19 DIAGNOSIS — M9902 Segmental and somatic dysfunction of thoracic region: Secondary | ICD-10-CM | POA: Diagnosis not present

## 2023-09-19 DIAGNOSIS — M545 Low back pain, unspecified: Secondary | ICD-10-CM | POA: Diagnosis not present

## 2023-09-19 DIAGNOSIS — M9903 Segmental and somatic dysfunction of lumbar region: Secondary | ICD-10-CM | POA: Diagnosis not present

## 2023-09-19 DIAGNOSIS — M9904 Segmental and somatic dysfunction of sacral region: Secondary | ICD-10-CM | POA: Diagnosis not present

## 2023-09-25 ENCOUNTER — Other Ambulatory Visit (HOSPITAL_BASED_OUTPATIENT_CLINIC_OR_DEPARTMENT_OTHER): Payer: 59

## 2023-09-25 ENCOUNTER — Ambulatory Visit (HOSPITAL_BASED_OUTPATIENT_CLINIC_OR_DEPARTMENT_OTHER): Payer: 59

## 2023-09-25 ENCOUNTER — Encounter (HOSPITAL_BASED_OUTPATIENT_CLINIC_OR_DEPARTMENT_OTHER): Payer: Self-pay

## 2023-09-27 ENCOUNTER — Ambulatory Visit: Payer: 59

## 2023-09-30 ENCOUNTER — Encounter: Payer: Self-pay | Admitting: Family Medicine

## 2023-09-30 ENCOUNTER — Other Ambulatory Visit: Payer: Self-pay | Admitting: Family Medicine

## 2023-09-30 DIAGNOSIS — M79671 Pain in right foot: Secondary | ICD-10-CM

## 2023-10-03 DIAGNOSIS — E119 Type 2 diabetes mellitus without complications: Secondary | ICD-10-CM | POA: Diagnosis not present

## 2023-10-03 DIAGNOSIS — M545 Low back pain, unspecified: Secondary | ICD-10-CM | POA: Diagnosis not present

## 2023-10-03 DIAGNOSIS — E1165 Type 2 diabetes mellitus with hyperglycemia: Secondary | ICD-10-CM | POA: Diagnosis not present

## 2023-10-03 DIAGNOSIS — M9901 Segmental and somatic dysfunction of cervical region: Secondary | ICD-10-CM | POA: Diagnosis not present

## 2023-10-03 DIAGNOSIS — M9904 Segmental and somatic dysfunction of sacral region: Secondary | ICD-10-CM | POA: Diagnosis not present

## 2023-10-03 DIAGNOSIS — M9903 Segmental and somatic dysfunction of lumbar region: Secondary | ICD-10-CM | POA: Diagnosis not present

## 2023-10-03 DIAGNOSIS — M10071 Idiopathic gout, right ankle and foot: Secondary | ICD-10-CM | POA: Diagnosis not present

## 2023-10-03 DIAGNOSIS — M9902 Segmental and somatic dysfunction of thoracic region: Secondary | ICD-10-CM | POA: Diagnosis not present

## 2023-10-10 ENCOUNTER — Ambulatory Visit: Payer: 59

## 2023-10-15 ENCOUNTER — Other Ambulatory Visit: Payer: Self-pay | Admitting: Family Medicine

## 2023-10-15 DIAGNOSIS — E785 Hyperlipidemia, unspecified: Secondary | ICD-10-CM

## 2023-10-15 DIAGNOSIS — E119 Type 2 diabetes mellitus without complications: Secondary | ICD-10-CM

## 2023-10-15 DIAGNOSIS — I10 Essential (primary) hypertension: Secondary | ICD-10-CM

## 2023-10-16 ENCOUNTER — Encounter: Payer: Self-pay | Admitting: Family Medicine

## 2023-10-17 MED ORDER — BD PEN NEEDLE NANO 2ND GEN 32G X 4 MM MISC: 11 refills | Status: AC

## 2023-10-23 ENCOUNTER — Other Ambulatory Visit: Payer: Self-pay | Admitting: Family Medicine

## 2023-10-30 ENCOUNTER — Encounter: Payer: Self-pay | Admitting: Family Medicine

## 2023-10-30 MED ORDER — TRULICITY 3 MG/0.5ML ~~LOC~~ SOAJ: 3.0000 mg | SUBCUTANEOUS | 1 refills | Status: DC

## 2023-11-03 ENCOUNTER — Encounter: Payer: Self-pay | Admitting: Family Medicine

## 2023-11-04 ENCOUNTER — Other Ambulatory Visit: Payer: Self-pay | Admitting: *Deleted

## 2023-11-04 DIAGNOSIS — E785 Hyperlipidemia, unspecified: Secondary | ICD-10-CM

## 2023-11-04 DIAGNOSIS — E7849 Other hyperlipidemia: Secondary | ICD-10-CM

## 2023-11-04 DIAGNOSIS — I10 Essential (primary) hypertension: Secondary | ICD-10-CM

## 2023-11-04 MED ORDER — FAMOTIDINE 40 MG PO TABS: 40.0000 mg | ORAL_TABLET | Freq: Every day | ORAL | 2 refills | Status: DC

## 2023-11-04 MED ORDER — FENOFIBRATE 160 MG PO TABS: ORAL_TABLET | ORAL | 2 refills | Status: DC

## 2023-11-04 MED ORDER — LEVOCETIRIZINE DIHYDROCHLORIDE 5 MG PO TABS: 5.0000 mg | ORAL_TABLET | Freq: Every evening | ORAL | 2 refills | Status: DC

## 2023-11-11 ENCOUNTER — Other Ambulatory Visit: Payer: Self-pay | Admitting: Family Medicine

## 2023-11-13 ENCOUNTER — Other Ambulatory Visit: Payer: Self-pay | Admitting: Family Medicine

## 2023-11-13 ENCOUNTER — Other Ambulatory Visit: Payer: Self-pay | Admitting: Family

## 2023-11-13 ENCOUNTER — Encounter: Payer: Self-pay | Admitting: Family Medicine

## 2023-11-13 DIAGNOSIS — E119 Type 2 diabetes mellitus without complications: Secondary | ICD-10-CM

## 2023-11-13 DIAGNOSIS — E785 Hyperlipidemia, unspecified: Secondary | ICD-10-CM

## 2023-11-13 DIAGNOSIS — I10 Essential (primary) hypertension: Secondary | ICD-10-CM

## 2023-11-13 MED ORDER — AZATHIOPRINE 50 MG PO TABS: ORAL_TABLET | ORAL | 1 refills | Status: DC

## 2023-11-16 ENCOUNTER — Encounter: Payer: Self-pay | Admitting: Family Medicine

## 2023-11-16 DIAGNOSIS — E1165 Type 2 diabetes mellitus with hyperglycemia: Secondary | ICD-10-CM

## 2023-11-18 MED ORDER — BASAGLAR KWIKPEN 100 UNIT/ML ~~LOC~~ SOPN: 50.0000 [IU] | PEN_INJECTOR | Freq: Every day | SUBCUTANEOUS | 4 refills | Status: DC

## 2023-11-20 ENCOUNTER — Encounter: Payer: Self-pay | Admitting: Family Medicine

## 2023-11-20 ENCOUNTER — Other Ambulatory Visit: Payer: Self-pay | Admitting: Family

## 2023-11-20 DIAGNOSIS — E119 Type 2 diabetes mellitus without complications: Secondary | ICD-10-CM

## 2023-11-20 DIAGNOSIS — I10 Essential (primary) hypertension: Secondary | ICD-10-CM

## 2023-11-20 DIAGNOSIS — E785 Hyperlipidemia, unspecified: Secondary | ICD-10-CM

## 2023-11-20 MED ORDER — BALSALAZIDE DISODIUM 750 MG PO CAPS: ORAL_CAPSULE | ORAL | 2 refills | Status: DC

## 2023-12-02 ENCOUNTER — Other Ambulatory Visit: Payer: Self-pay | Admitting: Family Medicine

## 2023-12-02 DIAGNOSIS — E1165 Type 2 diabetes mellitus with hyperglycemia: Secondary | ICD-10-CM

## 2023-12-12 ENCOUNTER — Other Ambulatory Visit: Payer: 59

## 2023-12-22 ENCOUNTER — Other Ambulatory Visit: Payer: Self-pay | Admitting: Urology

## 2023-12-22 DIAGNOSIS — N529 Male erectile dysfunction, unspecified: Secondary | ICD-10-CM

## 2023-12-23 DIAGNOSIS — M545 Low back pain, unspecified: Secondary | ICD-10-CM | POA: Diagnosis not present

## 2023-12-23 DIAGNOSIS — M9903 Segmental and somatic dysfunction of lumbar region: Secondary | ICD-10-CM | POA: Diagnosis not present

## 2023-12-23 DIAGNOSIS — M9904 Segmental and somatic dysfunction of sacral region: Secondary | ICD-10-CM | POA: Diagnosis not present

## 2023-12-23 DIAGNOSIS — M9901 Segmental and somatic dysfunction of cervical region: Secondary | ICD-10-CM | POA: Diagnosis not present

## 2023-12-23 DIAGNOSIS — M9902 Segmental and somatic dysfunction of thoracic region: Secondary | ICD-10-CM | POA: Diagnosis not present

## 2023-12-24 ENCOUNTER — Other Ambulatory Visit (INDEPENDENT_AMBULATORY_CARE_PROVIDER_SITE_OTHER): Payer: 59

## 2023-12-24 DIAGNOSIS — E1165 Type 2 diabetes mellitus with hyperglycemia: Secondary | ICD-10-CM | POA: Diagnosis not present

## 2023-12-24 DIAGNOSIS — I10 Essential (primary) hypertension: Secondary | ICD-10-CM

## 2023-12-24 DIAGNOSIS — E782 Mixed hyperlipidemia: Secondary | ICD-10-CM | POA: Diagnosis not present

## 2023-12-24 DIAGNOSIS — R252 Cramp and spasm: Secondary | ICD-10-CM

## 2023-12-24 LAB — COMPREHENSIVE METABOLIC PANEL WITH GFR
ALT: 31 U/L (ref 0–53)
AST: 31 U/L (ref 0–37)
Albumin: 4.4 g/dL (ref 3.5–5.2)
Alkaline Phosphatase: 69 U/L (ref 39–117)
BUN: 10 mg/dL (ref 6–23)
CO2: 27 meq/L (ref 19–32)
Calcium: 9.6 mg/dL (ref 8.4–10.5)
Chloride: 103 meq/L (ref 96–112)
Creatinine, Ser: 0.78 mg/dL (ref 0.40–1.50)
GFR: 97.83 mL/min
Glucose, Bld: 292 mg/dL — ABNORMAL HIGH (ref 70–99)
Potassium: 3.9 meq/L (ref 3.5–5.1)
Sodium: 138 meq/L (ref 135–145)
Total Bilirubin: 0.8 mg/dL (ref 0.2–1.2)
Total Protein: 6.6 g/dL (ref 6.0–8.3)

## 2023-12-24 LAB — CBC WITH DIFFERENTIAL/PLATELET
Basophils Absolute: 0 K/uL (ref 0.0–0.1)
Basophils Relative: 0.4 % (ref 0.0–3.0)
Eosinophils Absolute: 0.1 K/uL (ref 0.0–0.7)
Eosinophils Relative: 1.8 % (ref 0.0–5.0)
HCT: 34.4 % — ABNORMAL LOW (ref 39.0–52.0)
Hemoglobin: 12.1 g/dL — ABNORMAL LOW (ref 13.0–17.0)
Lymphocytes Relative: 18.3 % (ref 12.0–46.0)
Lymphs Abs: 0.6 K/uL — ABNORMAL LOW (ref 0.7–4.0)
MCHC: 35.2 g/dL (ref 30.0–36.0)
MCV: 97.2 fl (ref 78.0–100.0)
Monocytes Absolute: 0.2 K/uL (ref 0.1–1.0)
Monocytes Relative: 7.3 % (ref 3.0–12.0)
Neutro Abs: 2.4 K/uL (ref 1.4–7.7)
Neutrophils Relative %: 72.2 % (ref 43.0–77.0)
Platelets: 194 K/uL (ref 150.0–400.0)
RBC: 3.54 Mil/uL — ABNORMAL LOW (ref 4.22–5.81)
RDW: 15.3 % (ref 11.5–15.5)
WBC: 3.3 K/uL — ABNORMAL LOW (ref 4.0–10.5)

## 2023-12-24 LAB — LIPID PANEL
Cholesterol: 131 mg/dL (ref 0–200)
HDL: 39.4 mg/dL
LDL Cholesterol: 65 mg/dL (ref 0–99)
NonHDL: 91.85
Total CHOL/HDL Ratio: 3
Triglycerides: 135 mg/dL (ref 0.0–149.0)
VLDL: 27 mg/dL (ref 0.0–40.0)

## 2023-12-24 LAB — MAGNESIUM: Magnesium: 1.3 mg/dL — ABNORMAL LOW (ref 1.5–2.5)

## 2023-12-24 LAB — TSH: TSH: 1.58 u[IU]/mL (ref 0.35–5.50)

## 2023-12-24 LAB — HEMOGLOBIN A1C: Hgb A1c MFr Bld: 9.5 % — ABNORMAL HIGH (ref 4.6–6.5)

## 2023-12-25 ENCOUNTER — Encounter: Payer: Self-pay | Admitting: Family Medicine

## 2023-12-26 DIAGNOSIS — M79671 Pain in right foot: Secondary | ICD-10-CM | POA: Diagnosis not present

## 2023-12-26 DIAGNOSIS — M778 Other enthesopathies, not elsewhere classified: Secondary | ICD-10-CM | POA: Diagnosis not present

## 2023-12-26 DIAGNOSIS — M19071 Primary osteoarthritis, right ankle and foot: Secondary | ICD-10-CM | POA: Diagnosis not present

## 2023-12-27 ENCOUNTER — Telehealth: Payer: Self-pay | Admitting: *Deleted

## 2023-12-27 NOTE — Telephone Encounter (Signed)
 Patient notified of lab results and he stated that he will do the Basaglar increasing 2 units.    For the magnesium he is taking magnesium glycinate 300mg .  Not sure if that's the same or not.  Can you please advise.

## 2023-12-27 NOTE — Telephone Encounter (Signed)
-----   Message from Danise Edge sent at 12/25/2023  8:51 PM EDT ----- Notify sugar is still up. He either needs to increase his Basaglar by 2 units every 3 days til he starts seeing some numbers close to 100 or we need to start Semiglutide. Also magnesium is low start magnesium gluconate 240 mg daily and recheck level in 1 month

## 2023-12-30 ENCOUNTER — Other Ambulatory Visit: Payer: Self-pay | Admitting: *Deleted

## 2023-12-30 DIAGNOSIS — M9903 Segmental and somatic dysfunction of lumbar region: Secondary | ICD-10-CM | POA: Diagnosis not present

## 2023-12-30 DIAGNOSIS — M545 Low back pain, unspecified: Secondary | ICD-10-CM | POA: Diagnosis not present

## 2023-12-30 DIAGNOSIS — M9904 Segmental and somatic dysfunction of sacral region: Secondary | ICD-10-CM | POA: Diagnosis not present

## 2023-12-30 DIAGNOSIS — M9901 Segmental and somatic dysfunction of cervical region: Secondary | ICD-10-CM | POA: Diagnosis not present

## 2023-12-30 DIAGNOSIS — R790 Abnormal level of blood mineral: Secondary | ICD-10-CM

## 2023-12-30 DIAGNOSIS — M9902 Segmental and somatic dysfunction of thoracic region: Secondary | ICD-10-CM | POA: Diagnosis not present

## 2023-12-30 NOTE — Telephone Encounter (Signed)
 Spoke with patient and advised him to take 2 magnesium glycinate 300mg  a day.  Patient will call to set up lab appointment, he is going out of town and may be back next month.  If he does be back in time he will get at next visit.

## 2024-01-01 ENCOUNTER — Other Ambulatory Visit: Payer: Self-pay | Admitting: Urology

## 2024-01-01 DIAGNOSIS — N529 Male erectile dysfunction, unspecified: Secondary | ICD-10-CM

## 2024-01-01 MED ORDER — TADALAFIL 20 MG PO TABS: 20.0000 mg | ORAL_TABLET | Freq: Every day | ORAL | 11 refills | Status: AC | PRN

## 2024-01-17 ENCOUNTER — Encounter: Payer: Self-pay | Admitting: Family Medicine

## 2024-01-17 DIAGNOSIS — I10 Essential (primary) hypertension: Secondary | ICD-10-CM

## 2024-01-17 DIAGNOSIS — E7849 Other hyperlipidemia: Secondary | ICD-10-CM

## 2024-01-17 DIAGNOSIS — E119 Type 2 diabetes mellitus without complications: Secondary | ICD-10-CM

## 2024-01-17 DIAGNOSIS — E785 Hyperlipidemia, unspecified: Secondary | ICD-10-CM

## 2024-01-20 MED ORDER — FENOFIBRATE 160 MG PO TABS: 160.0000 mg | ORAL_TABLET | Freq: Every day | ORAL | 1 refills | Status: AC

## 2024-01-20 MED ORDER — LEVOCETIRIZINE DIHYDROCHLORIDE 5 MG PO TABS: 5.0000 mg | ORAL_TABLET | Freq: Every evening | ORAL | 1 refills | Status: AC

## 2024-01-20 MED ORDER — METFORMIN HCL 1000 MG PO TABS: 1000.0000 mg | ORAL_TABLET | Freq: Two times a day (BID) | ORAL | 1 refills | Status: DC

## 2024-01-20 MED ORDER — LOSARTAN POTASSIUM 100 MG PO TABS: 100.0000 mg | ORAL_TABLET | Freq: Every day | ORAL | 1 refills | Status: DC

## 2024-01-20 MED ORDER — FAMOTIDINE 40 MG PO TABS: 40.0000 mg | ORAL_TABLET | Freq: Every day | ORAL | 1 refills | Status: AC

## 2024-03-18 DIAGNOSIS — M9903 Segmental and somatic dysfunction of lumbar region: Secondary | ICD-10-CM | POA: Diagnosis not present

## 2024-03-18 DIAGNOSIS — M545 Low back pain, unspecified: Secondary | ICD-10-CM | POA: Diagnosis not present

## 2024-03-18 DIAGNOSIS — M9902 Segmental and somatic dysfunction of thoracic region: Secondary | ICD-10-CM | POA: Diagnosis not present

## 2024-03-18 DIAGNOSIS — M9904 Segmental and somatic dysfunction of sacral region: Secondary | ICD-10-CM | POA: Diagnosis not present

## 2024-03-18 DIAGNOSIS — M9901 Segmental and somatic dysfunction of cervical region: Secondary | ICD-10-CM | POA: Diagnosis not present

## 2024-03-19 ENCOUNTER — Ambulatory Visit: Payer: 59 | Admitting: Family Medicine

## 2024-03-20 ENCOUNTER — Other Ambulatory Visit: Payer: Self-pay

## 2024-03-20 ENCOUNTER — Ambulatory Visit: Admitting: Family Medicine

## 2024-03-20 VITALS — BP 160/88 | HR 95 | Ht 66.0 in | Wt 178.0 lb

## 2024-03-20 DIAGNOSIS — M79671 Pain in right foot: Secondary | ICD-10-CM

## 2024-03-20 DIAGNOSIS — M79672 Pain in left foot: Secondary | ICD-10-CM | POA: Diagnosis not present

## 2024-03-20 MED ORDER — CELECOXIB 100 MG PO CAPS: 100.0000 mg | ORAL_CAPSULE | Freq: Two times a day (BID) | ORAL | 1 refills | Status: DC | PRN

## 2024-03-20 NOTE — Patient Instructions (Addendum)
 Thank you for coming in today.   Do Physical Therapy in [place]  Take Celebrex sparingly.

## 2024-03-20 NOTE — Progress Notes (Signed)
 Joanna Muck, PhD, LAT, ATC acting as a scribe for Garlan Juniper, MD.  Curtis Kennedy is a 58 y.o. male who presents to [facility] at [facility] today for bilat foot pain. Pt was previously seen by Dr. Alease Hunter on 12/13/21 for bilat hip pain.  Today, pt c/o bilat foot pain, R>L, ongoing since Nov 2024. Was previously treated a a gout flair in his R foot. He was seen by podiatry in March. Pt locates pain to across the talocrural joint and slightly into the dorsum of his foot.   He's got a lot going on right now; trying to take care of elderly in-laws, both recovering from hip fx  Treatments tried: colchicine , meloxicam, Celebrex, naproxen  Dx testing: 12/26/23 R foot XR 09/10/23 R foot XR  Pertinent review of systems: No fevers or chills  Relevant historical information: Hypertension and ulcerative colitis.  Diabetes.   Exam:  BP (!) 160/88   Pulse 95   Ht 5\' 6"  (1.676 m)   Wt 178 lb (80.7 kg)   SpO2 99%   BMI 28.73 kg/m  General: Well Developed, well nourished, and in no acute distress.   MSK: Right foot mild pes planus.  No swelling or significant other abnormalities visible.  Mildly tender palpation anterior lateral foot.  Normal foot and ankle motion.  Stable ligamentous exam intact strength.    Lab and Radiology Results  EXAM: RIGHT FOOT COMPLETE - 3+ VIEW   COMPARISON:  None Available.   FINDINGS: There is no acute bony or joint abnormality. Joint spaces and alignment are normal. No erosion, periostitis or osteophytosis. Soft tissues demonstrate scattered atherosclerotic calcifications but are otherwise negative.   IMPRESSION: 1. Negative for gout. No acute finding. 2. Atherosclerotic calcifications.     Electronically Signed   By: Etheleen Her M.D.   On: 09/24/2023 11:20   I, Garlan Juniper, personally (independently) visualized and performed the interpretation of the images attached in this note.    [facility] MSK  Ultrasound of: Right anterior ankle No joint effusion or significant abnormalities are visible. No tenosynovitis is present.  Normal-appearing MSK ultrasound. Impression: Unclear source of pain MSK ultrasound.   Assessment and Plan: 58 y.o. male with bilateral anterior foot and ankle pain right worse than left.  This has been ongoing for months.  He had an initial evaluation with an x-ray in November 2024 that was unremarkable.  He has had some treatment with NSAIDs and colchicine .  The Celebrex helps some.  We talked about safe dosing of this medication.  He should use it infrequently but can use it especially with Tylenol .  Plan for trial of physical therapy.  As noted above he has significant caregiver burden helping his in-laws in [place] .  He will be going back down to [place]  at [facility] in about a week for a few months.  Will arrange for physical therapy to be done down there.  If not improving we will plan for MRI.  We should be able to get the MRI in [place]  as well if needed.   PDMP not reviewed this encounter. Orders Placed This Encounter  Procedures   US  LIMITED JOINT SPACE STRUCTURES LOW BILAT(NO LINKED CHARGES)    Reason for Exam (SYMPTOM  OR DIAGNOSIS REQUIRED):   bilateral foot pain    Preferred imaging location?:   [facility] referral to Physical Therapy    Referral Priority:   Routine    Referral  Type:   Physical Medicine    Referral Reason:   Specialty Services Required    Requested Specialty:   Physical Therapy    Number of Visits Requested:   1   Meds ordered this encounter  Medications   celecoxib (CELEBREX) 100 MG capsule    Sig: Take 1 capsule (100 mg total) by mouth 2 (two) times daily as needed.    Dispense:  60 capsule    Refill:  1     Discussed warning signs or symptoms. Please see discharge instructions. Patient expresses understanding.   The above documentation has been reviewed and is accurate and complete  Garlan Juniper, M.D.

## 2024-03-24 ENCOUNTER — Ambulatory Visit (HOSPITAL_BASED_OUTPATIENT_CLINIC_OR_DEPARTMENT_OTHER)
Admission: RE | Admit: 2024-03-24 | Discharge: 2024-03-24 | Disposition: A | Discharge status: Home / Self Care | Payer: Self-pay | Source: Ambulatory Visit | Attending: Cardiology | Admitting: Cardiology

## 2024-03-24 DIAGNOSIS — R0609 Other forms of dyspnea: Secondary | ICD-10-CM | POA: Diagnosis not present

## 2024-03-24 LAB — ECHOCARDIOGRAM COMPLETE
AR max vel: 2.39 cm2
AV Area VTI: 2.54 cm2
AV Area mean vel: 2.41 cm2
AV Mean grad: 5 mmHg
AV Peak grad: 7.7 mmHg
Ao pk vel: 1.39 m/s
Area-P 1/2: 4.71 cm2
Calc EF: 61.1 %
S' Lateral: 2.8 cm
Single Plane A2C EF: 60.1 %
Single Plane A4C EF: 60.5 %

## 2024-03-25 ENCOUNTER — Ambulatory Visit: Payer: Self-pay | Admitting: Cardiology

## 2024-03-25 DIAGNOSIS — L821 Other seborrheic keratosis: Secondary | ICD-10-CM | POA: Diagnosis not present

## 2024-03-25 DIAGNOSIS — D485 Neoplasm of uncertain behavior of skin: Secondary | ICD-10-CM | POA: Diagnosis not present

## 2024-03-25 DIAGNOSIS — D2372 Other benign neoplasm of skin of left lower limb, including hip: Secondary | ICD-10-CM | POA: Diagnosis not present

## 2024-03-25 NOTE — Assessment & Plan Note (Signed)
 Hydrate and monitor

## 2024-03-25 NOTE — Assessment & Plan Note (Signed)
 Hgba1ccontinue to monitor, minimize simple carbs. Increase exercise as tolerated. Continue current meds

## 2024-03-25 NOTE — Assessment & Plan Note (Signed)
 Well controlled, no changes to meds. Encouraged heart healthy diet such as the DASH diet and exercise as tolerated.

## 2024-03-25 NOTE — Assessment & Plan Note (Signed)
 Supplement and monitor

## 2024-03-25 NOTE — Assessment & Plan Note (Addendum)
 Patient encouraged to maintain heart healthy diet, regular exercise, adequate sleep. Consider daily probiotics. Take medications as prescribed. Labs ordered and reviewed. Shingrix is the new shingles shot, 2 shots over 2-6 months, prevnar 20 and Respiratory Syncitial Virus vaccine are recommended.

## 2024-03-25 NOTE — Assessment & Plan Note (Signed)
 Tolerating statin, encouraged heart healthy diet, avoid trans fats, minimize simple carbs and saturated fats. Increase exercise as tolerated

## 2024-03-26 ENCOUNTER — Ambulatory Visit (INDEPENDENT_AMBULATORY_CARE_PROVIDER_SITE_OTHER): Payer: 59 | Admitting: Family Medicine

## 2024-03-26 ENCOUNTER — Encounter: Payer: Self-pay | Admitting: Family Medicine

## 2024-03-26 VITALS — BP 138/88 | HR 90 | Resp 16 | Ht 66.0 in | Wt 176.0 lb

## 2024-03-26 DIAGNOSIS — R252 Cramp and spasm: Secondary | ICD-10-CM | POA: Diagnosis not present

## 2024-03-26 DIAGNOSIS — D649 Anemia, unspecified: Secondary | ICD-10-CM

## 2024-03-26 DIAGNOSIS — I10 Essential (primary) hypertension: Secondary | ICD-10-CM | POA: Diagnosis not present

## 2024-03-26 DIAGNOSIS — R790 Abnormal level of blood mineral: Secondary | ICD-10-CM | POA: Diagnosis not present

## 2024-03-26 DIAGNOSIS — E1165 Type 2 diabetes mellitus with hyperglycemia: Secondary | ICD-10-CM

## 2024-03-26 DIAGNOSIS — Z0000 Encounter for general adult medical examination without abnormal findings: Secondary | ICD-10-CM | POA: Diagnosis not present

## 2024-03-26 DIAGNOSIS — E782 Mixed hyperlipidemia: Secondary | ICD-10-CM | POA: Diagnosis not present

## 2024-03-26 MED ORDER — LIDOCAINE-PRILOCAINE 2.5-2.5 % EX CREA: 1.0000 | TOPICAL_CREAM | CUTANEOUS | 2 refills | Status: AC | PRN

## 2024-03-26 NOTE — Patient Instructions (Addendum)
 Shingrix is the new shingles shot, 2 shots over 2-6 months, confirm coverage with insurance and document, then can return here for shots with nurse appt or at pharmacy   RSV, Respiratory Syncitial Virus vaccine, Arexvy Vaccine at pharmacy  Prevnar 20 at pharmacy   Preventive Care 50-60 Years Old, Male Preventive care refers to lifestyle choices and visits with your health care provider that can promote health and wellness. Preventive care visits are also called wellness exams. What can I expect for my preventive care visit? Counseling During your preventive care visit, your health care provider may ask about your: Medical history, including: Past medical problems. Family medical history. Current health, including: Emotional well-being. Home life and relationship well-being. Sexual activity. Lifestyle, including: Alcohol, nicotine or tobacco, and drug use. Access to firearms. Diet, exercise, and sleep habits. Safety issues such as seatbelt and bike helmet use. Sunscreen use. Work and work Astronomer. Physical exam Your health care provider will check your: Height and weight. These may be used to calculate your BMI (body mass index). BMI is a measurement that tells if you are at a healthy weight. Waist circumference. This measures the distance around your waistline. This measurement also tells if you are at a healthy weight and may help predict your risk of certain diseases, such as type 2 diabetes and high blood pressure. Heart rate and blood pressure. Body temperature. Skin for abnormal spots. What immunizations do I need?  Vaccines are usually given at various ages, according to a schedule. Your health care provider will recommend vaccines for you based on your age, medical history, and lifestyle or other factors, such as travel or where you work. What tests do I need? Screening Your health care provider may recommend screening tests for certain conditions. This may  include: Lipid and cholesterol levels. Diabetes screening. This is done by checking your blood sugar (glucose) after you have not eaten for a while (fasting). Hepatitis B test. Hepatitis C test. HIV (human immunodeficiency virus) test. STI (sexually transmitted infection) testing, if you are at risk. Lung cancer screening. Prostate cancer screening. Colorectal cancer screening. Talk with your health care provider about your test results, treatment options, and if necessary, the need for more tests. Follow these instructions at home: Eating and drinking  Eat a diet that includes fresh fruits and vegetables, whole grains, lean protein, and low-fat dairy products. Take vitamin and mineral supplements as recommended by your health care provider. Do not drink alcohol if your health care provider tells you not to drink. If you drink alcohol: Limit how much you have to 0-2 drinks a day. Know how much alcohol is in your drink. In the [place] one drink equals one 12 oz bottle of beer (355 mL), one 5 oz glass of wine (148 mL), or one 1 oz glass of hard liquor (44 mL). Lifestyle Brush your teeth every morning and night with fluoride toothpaste. Floss one time each day. Exercise for at least 30 minutes 5 or more days each week. Do not use any products that contain nicotine or tobacco. These products include cigarettes, chewing tobacco, and vaping devices, such as e-cigarettes. If you need help quitting, ask your health care provider. Do not use drugs. If you are sexually active, practice safe sex. Use a condom or other form of protection to prevent STIs. Take aspirin only as told by your health care provider. Make sure that you understand how much to take and what form to take. Work with your health care provider to  find out whether it is safe and beneficial for you to take aspirin daily. Find healthy ways to manage stress, such as: Meditation, yoga, or listening to music. Journaling. Talking to a  trusted person. Spending time with friends and family. Minimize exposure to UV radiation to reduce your risk of skin cancer. Safety Always wear your seat belt while driving or riding in a vehicle. Do not drive: If you have been drinking alcohol. Do not ride with someone who has been drinking. When you are tired or distracted. While texting. If you have been using any mind-altering substances or drugs. Wear a helmet and other protective equipment during sports activities. If you have firearms in your house, make sure you follow all gun safety procedures. What's next? Go to your health care provider once a year for an annual wellness visit. Ask your health care provider how often you should have your eyes and teeth checked. Stay up to date on all vaccines. This information is not intended to replace advice given to you by your health care provider. Make sure you discuss any questions you have with your health care provider. Document Revised: 03/29/2021 Document Reviewed: 03/29/2021 Elsevier Patient Education  2024 [employer].

## 2024-03-27 ENCOUNTER — Ambulatory Visit: Payer: Self-pay | Admitting: Family Medicine

## 2024-03-27 DIAGNOSIS — E1165 Type 2 diabetes mellitus with hyperglycemia: Secondary | ICD-10-CM

## 2024-03-27 LAB — CBC WITH DIFFERENTIAL/PLATELET
Absolute Lymphocytes: 988 {cells}/uL (ref 850–3900)
Absolute Monocytes: 421 {cells}/uL (ref 200–950)
Basophils Absolute: 22 {cells}/uL (ref 0–200)
Basophils Relative: 0.4 %
Eosinophils Absolute: 59 {cells}/uL (ref 15–500)
Eosinophils Relative: 1.1 %
HCT: 41.9 % (ref 38.5–50.0)
Hemoglobin: 14.1 g/dL (ref 13.2–17.1)
MCH: 33.9 pg — ABNORMAL HIGH (ref 27.0–33.0)
MCHC: 33.7 g/dL (ref 32.0–36.0)
MCV: 100.7 fL — ABNORMAL HIGH (ref 80.0–100.0)
MPV: 9.9 fL (ref 7.5–12.5)
Monocytes Relative: 7.8 %
Neutro Abs: 3910 {cells}/uL (ref 1500–7800)
Neutrophils Relative %: 72.4 %
Platelets: 206 10*3/uL (ref 140–400)
RBC: 4.16 10*6/uL — ABNORMAL LOW (ref 4.20–5.80)
RDW: 13.6 % (ref 11.0–15.0)
Total Lymphocyte: 18.3 %
WBC: 5.4 10*3/uL (ref 3.8–10.8)

## 2024-03-27 LAB — COMPREHENSIVE METABOLIC PANEL WITH GFR
AG Ratio: 1.8 (calc) (ref 1.0–2.5)
ALT: 154 U/L — ABNORMAL HIGH (ref 9–46)
AST: 166 U/L — ABNORMAL HIGH (ref 10–35)
Albumin: 4.9 g/dL (ref 3.6–5.1)
Alkaline phosphatase (APISO): 77 U/L (ref 35–144)
BUN: 14 mg/dL (ref 7–25)
CO2: 25 mmol/L (ref 20–32)
Calcium: 9.6 mg/dL (ref 8.6–10.3)
Chloride: 100 mmol/L (ref 98–110)
Creat: 0.88 mg/dL (ref 0.70–1.30)
Globulin: 2.7 g/dL (ref 1.9–3.7)
Glucose, Bld: 271 mg/dL — ABNORMAL HIGH (ref 65–99)
Potassium: 4 mmol/L (ref 3.5–5.3)
Sodium: 137 mmol/L (ref 135–146)
Total Bilirubin: 1.6 mg/dL — ABNORMAL HIGH (ref 0.2–1.2)
Total Protein: 7.6 g/dL (ref 6.1–8.1)
eGFR: 99 mL/min/{1.73_m2} (ref >=60)

## 2024-03-27 LAB — HEMOGLOBIN A1C
Hgb A1c MFr Bld: 8.3 % — ABNORMAL HIGH (ref <5.7)
Mean Plasma Glucose: 192 mg/dL
eAG (mmol/L): 10.6 mmol/L

## 2024-03-27 LAB — LIPID PANEL
Cholesterol: 116 mg/dL (ref <200)
HDL: 34 mg/dL — ABNORMAL LOW (ref >=40)
LDL Cholesterol (Calc): 56 mg/dL
Non-HDL Cholesterol (Calc): 82 mg/dL (ref <130)
Total CHOL/HDL Ratio: 3.4 (calc) (ref <5.0)
Triglycerides: 189 mg/dL — ABNORMAL HIGH (ref <150)

## 2024-03-27 LAB — TSH: TSH: 1.08 m[IU]/L (ref 0.40–4.50)

## 2024-03-27 LAB — MAGNESIUM: Magnesium: 1.5 mg/dL (ref 1.5–2.5)

## 2024-03-28 LAB — MICROALBUMIN / CREATININE URINE RATIO
Creatinine, Urine: 86 mg/dL (ref 20–320)
Microalb Creat Ratio: 1034 mg/g{creat} — ABNORMAL HIGH (ref <30)
Microalb, Ur: 88.9 mg/dL

## 2024-03-29 ENCOUNTER — Encounter: Payer: Self-pay | Admitting: Family Medicine

## 2024-03-29 DIAGNOSIS — R790 Abnormal level of blood mineral: Secondary | ICD-10-CM | POA: Insufficient documentation

## 2024-03-29 NOTE — Progress Notes (Signed)
 Subjective:    Patient ID: Curtis Kennedy, male    DOB: January 10, 1966, 58 y.o.   MRN: [id number]  Chief Complaint  Patient presents with   Annual Exam    Patient presnts today for a physical exam.   Quality Metric Gaps    Colonoscopy, zoster    HPI Patient is in today for annual preventative exam and follow up on chronic medical concerns. No recent febrile illness or hospitalizations. Denies CP/palp/SOB/HA/congestion/fevers/GI or GU c/o. Taking meds as prescribed. He has been in [place]  helping his in laws after they both broke their hips. He has not been checking his sugars or managing his diet well as they have had to eaten out a great deal. Notes he overall feels well despite the stress of caring for his in laws.   Past Medical History:  Diagnosis Date   Allergy    Anemia 07/02/2017   Arthritis 12/10/2022   Colitis, ulcerative (HCC) Dx'd 2001   Dr. Jeanetta Milian; [facility]--has been in remission since about 2009   Diabetes mellitus without complication (HCC) 58 yrs old   type 2 (dx'd after long courses of prednisone for his UC)   Erectile dysfunction    Family history of tinea corporis 07/25/2012   Gastroesophageal reflux disease 04/05/2022   Hyperlipidemia    Hypertension    Insomnia    Low back pain 08/07/2016   Preventative health care 12/31/2016   Right shoulder pain 12/31/2016   Sinusitis 08/28/2015    Past Surgical History:  Procedure Laterality Date   TRIGGER FINGER RELEASE  10/15/2009   b/l middle fingers    Family History  Problem Relation Age of Onset   Diabetes Father    Hyperlipidemia Father    Hypertension Father    Kidney disease Father        Kidney Failure   Heart disease Father 32       MI   Hypertension Sister    Hypertension Brother    Diabetes Paternal Grandfather    Hypertension Brother    Hyperlipidemia Brother    Heart disease Brother        Valve Surgery ?   Diabetes Brother    Hypertension Brother    ADD / ADHD Brother     Social  History   Socioeconomic History   Marital status: Married    Spouse name: Not on file   Number of children: Not on file   Years of education: Not on file   Highest education level: Some college, no degree  Occupational History   Not on file  Tobacco Use   Smoking status: Never   Smokeless tobacco: Never  Substance and Sexual Activity   Alcohol use: Yes    Comment: Rare   Drug use: Never   Sexual activity: Yes    Partners: Female    Birth control/protection: None    Comment: lives with wife, does office work follows with  Other Topics Concern   Not on file  Social History Narrative   Married, 60 y/o girl.     Lives in [place], grew up in [place] --relocated to [facility] [place].   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         Social Drivers of Corporate investment banker Strain: Low Risk  (09/09/2023)   Overall Financial Resource Strain (CARDIA)    Difficulty of Paying Living Expenses: Not hard at all  Food Insecurity: No Food Insecurity (09/09/2023)   Hunger Vital Sign    Worried About Running Out of Food in the Last Year: Never true    Ran Out of Food in the Last Year: Never true  Transportation Needs: No Transportation Needs (09/09/2023)   PRAPARE - Administrator, Civil Service (Medical): No    Lack of Transportation (Non-Medical): No  Physical Activity: Unknown (09/09/2023)   Exercise Vital Sign    Days of Exercise per Week: 5 days    Minutes of Exercise per Session: Patient declined  Stress: No Stress Concern Present (09/09/2023)   [employer] of Occupational Health - Occupational Stress Questionnaire    Feeling of Stress : Only a little  Social Connections: Unknown (09/09/2023)   Social Connection and Isolation Panel    Frequency of Communication with Friends and Family: Three times a week    Frequency of Social Gatherings with Friends and Family: Once a week    Attends  Religious Services: Patient declined    Database administrator or Organizations: No    Attends Engineer, structural: Not on file    Marital Status: Married  Intimate Partner Violence: Unknown (01/19/2023)   Received from [facility]   HITS    Physically Hurt: Not on file    Insult or Talk Down To: Not on file    Threaten Physical Harm: Not on file    Scream or Curse: Not on file    Outpatient Medications Prior to Visit  Medication Sig Dispense Refill   albuterol  (VENTOLIN  HFA) 108 (90 Base) MCG/ACT inhaler TAKE 2 PUFFS BY MOUTH EVERY 6 HOURS AS NEEDED FOR WHEEZE OR SHORTNESS OF BREATH 18 each 2   azaTHIOprine  (IMURAN ) 50 MG tablet TAKE 4 AND 1/2 TAB BY MOUTH EVERY DAY 405 tablet 1   balsalazide (COLAZAL ) 750 MG capsule TAKE 3 CAPSULES(2250 MG) BY MOUTH THREE TIMES DAILY 270 capsule 2   celecoxib  (CELEBREX ) 100 MG capsule Take 1 capsule (100 mg total) by mouth 2 (two) times daily as needed. 60 capsule 1   colchicine  0.6 MG tablet 2 tabs po once then 1 tab po every 2 hours to max of 6 tabs in 24 hours or until pain relief or side effects then 1 tab po daily x 3 weeks 28 tablet 1   Continuous Blood Gluc Receiver (DEXCOM G6 RECEIVER) DEVI Use for continuous blood glucose monitoring 1 each 0   Continuous Blood Gluc Sensor (DEXCOM G6 SENSOR) MISC Use for continuous blood glucose monitoring.  Replace every 10 days. 4 each 0   Continuous Blood Gluc Transmit (DEXCOM G6 TRANSMITTER) MISC Use for continuous blood glucose monitoring.  Replace every 90 days 1 each 1   Continuous Glucose Sensor (DEXCOM G7 SENSOR) MISC Check blood sugar daily 2 each 5   Continuous Glucose Sensor (FREESTYLE LIBRE 3 SENSOR) MISC Place 1 sensor on the skin every 14 days. Use to check glucose continuously 1 each 0   Dulaglutide  (TRULICITY ) 3 MG/0.5ML SOAJ Inject 3 mg into the skin once a week. 6 mL 1   famotidine  (PEPCID ) 40 MG tablet Take 1 tablet (40 mg total) by mouth daily. 90 tablet 1   fenofibrate  160 MG  tablet Take 1 tablet (160 mg total) by mouth daily. 90 tablet 1   fluticasone  (FLONASE ) 50 MCG/ACT nasal spray Place 2 sprays into both nostrils daily. 16 g 5   fluticasone  (FLOVENT  HFA) 110 MCG/ACT inhaler Inhale 2 puffs into the lungs 2 (  two) times daily. 1 Inhaler 2   imiquimod  (ALDARA ) 5 % cream Apply topically 3 (three) times a week. 24 each 2   Insulin  Glargine (BASAGLAR  KWIKPEN) 100 UNIT/ML Inject 50 Units into the skin daily. 15 mL 4   Insulin  Pen Needle (BD PEN NEEDLE NANO 2ND GEN) 32G X 4 MM MISC USE 1 NEEDLE DAILY AS DIRECTED 100 each 11   Lancets (ONETOUCH DELICA PLUS LANCET30G) MISC USE AS DIRECTED TWICE DAILY TO CHECK BLOOD SUGAR 100 each 0   levocetirizine (XYZAL ) 5 MG tablet Take 1 tablet (5 mg total) by mouth every evening. 90 tablet 1   losartan  (COZAAR ) 100 MG tablet Take 1 tablet (100 mg total) by mouth daily. 90 tablet 1   meloxicam (MOBIC) 15 MG tablet Take 15 mg by mouth daily.     metFORMIN  (GLUCOPHAGE ) 1000 MG tablet Take 1 tablet (1,000 mg total) by mouth 2 (two) times daily with a meal. 180 tablet 1   methocarbamol  (ROBAXIN ) 500 MG tablet Take 1 tablet (500 mg total) by mouth every 6 (six) hours as needed for muscle spasms. 60 tablet 2   montelukast  (SINGULAIR ) 10 MG tablet TAKE 1 TABLET BY MOUTH EVERYDAY AT BEDTIME 30 tablet 5   olopatadine  (PATANOL) 0.1 % ophthalmic solution Place 1 drop into both eyes 2 (two) times daily. 5 mL 3   rosuvastatin  (CRESTOR ) 5 MG tablet TAKE 1 TABLET BY MOUTH EVERY DAY 90 tablet 2   tadalafil  (CIALIS ) 20 MG tablet Take 1 tablet (20 mg total) by mouth daily as needed. 30 tablet 11   valACYclovir  (VALTREX ) 1000 MG tablet Take 1 tablet (1,000 mg total) by mouth 2 (two) times daily as needed. 60 tablet 3   No facility-administered medications prior to visit.    Allergies  Allergen Reactions   Penicillins Hives   Simvastatin     Myalgias    Review of Systems  Constitutional:  Negative for chills, fever and malaise/fatigue.  HENT:   Negative for congestion and hearing loss.   Eyes:  Negative for discharge.  Respiratory:  Negative for cough, sputum production and shortness of breath.   Cardiovascular:  Negative for chest pain, palpitations and leg swelling.  Gastrointestinal:  Negative for abdominal pain, blood in stool, constipation, diarrhea, heartburn, nausea and vomiting.  Genitourinary:  Negative for dysuria, frequency, hematuria and urgency.  Musculoskeletal:  Negative for back pain, falls and myalgias.  Skin:  Negative for rash.  Neurological:  Negative for dizziness, sensory change, loss of consciousness, weakness and headaches.  Endo/Heme/Allergies:  Negative for environmental allergies. Does not bruise/bleed easily.  Psychiatric/Behavioral:  Negative for depression and suicidal ideas. The patient is nervous/anxious. The patient does not have insomnia.        Objective:    Physical Exam Vitals reviewed.  Constitutional:      General: He is not in acute distress.    Appearance: Normal appearance. He is not ill-appearing or diaphoretic.  HENT:     Head: Normocephalic and atraumatic.     Right Ear: Tympanic membrane, ear canal and external ear normal. There is no impacted cerumen.     Left Ear: Tympanic membrane, ear canal and external ear normal. There is no impacted cerumen.     Nose: Nose normal. No rhinorrhea.     Mouth/Throat:     Pharynx: Oropharynx is clear.   Eyes:     General: No scleral icterus.    Extraocular Movements: Extraocular movements intact.     Conjunctiva/sclera: Conjunctivae normal.  Pupils: Pupils are equal, round, and reactive to light.   Neck:     Thyroid : No thyroid  mass or thyroid  tenderness.   Cardiovascular:     Rate and Rhythm: Normal rate and regular rhythm.     Pulses: Normal pulses.     Heart sounds: Normal heart sounds. No murmur heard. Pulmonary:     Effort: Pulmonary effort is normal.     Breath sounds: Normal breath sounds. No wheezing.  Abdominal:      General: Bowel sounds are normal.     Palpations: Abdomen is soft. There is no mass.     Tenderness: There is no guarding.   Musculoskeletal:        General: No swelling. Normal range of motion.     Cervical back: Normal range of motion and neck supple. No rigidity.     Right lower leg: No edema.     Left lower leg: No edema.  Lymphadenopathy:     Cervical: No cervical adenopathy.   Skin:    General: Skin is warm and dry.     Findings: No rash.   Neurological:     General: No focal deficit present.     Mental Status: He is alert and oriented to person, place, and time.     Cranial Nerves: No cranial nerve deficit.     Deep Tendon Reflexes: Reflexes normal.   Psychiatric:        Mood and Affect: Mood normal.        Behavior: Behavior normal.     BP 138/88   Pulse 90   Resp 16   Ht 5' 6 (1.676 m)   Wt 176 lb (79.8 kg)   SpO2 96%   BMI 28.41 kg/m  Wt Readings from Last 3 Encounters:  03/26/24 176 lb (79.8 kg)  03/20/24 178 lb (80.7 kg)  09/18/23 178 lb (80.7 kg)    Diabetic Foot Exam - Simple   No data filed    Lab Results  Component Value Date   WBC 5.4 03/26/2024   HGB 14.1 03/26/2024   HCT 41.9 03/26/2024   PLT 206 03/26/2024   GLUCOSE 271 (H) 03/26/2024   CHOL 116 03/26/2024   TRIG 189 (H) 03/26/2024   HDL 34 (L) 03/26/2024   LDLCALC 56 03/26/2024   ALT 154 (H) 03/26/2024   AST 166 (H) 03/26/2024   NA 137 03/26/2024   K 4.0 03/26/2024   CL 100 03/26/2024   CREATININE 0.88 03/26/2024   BUN 14 03/26/2024   CO2 25 03/26/2024   TSH 1.08 03/26/2024   PSA 1.05 12/10/2022   HGBA1C 8.3 (H) 03/26/2024   MICROALBUR 88.9 03/26/2024    Lab Results  Component Value Date   TSH 1.08 03/26/2024   Lab Results  Component Value Date   WBC 5.4 03/26/2024   HGB 14.1 03/26/2024   HCT 41.9 03/26/2024   MCV 100.7 (H) 03/26/2024   PLT 206 03/26/2024   Lab Results  Component Value Date   NA 137 03/26/2024   K 4.0 03/26/2024   CO2 25 03/26/2024   GLUCOSE  271 (H) 03/26/2024   BUN 14 03/26/2024   CREATININE 0.88 03/26/2024   BILITOT 1.6 (H) 03/26/2024   ALKPHOS 69 12/24/2023   AST 166 (H) 03/26/2024   ALT 154 (H) 03/26/2024   PROT 7.6 03/26/2024   ALBUMIN 4.4 12/24/2023   CALCIUM  9.6 03/26/2024   EGFR 99 03/26/2024   GFR 97.83 12/24/2023   Lab Results  Component Value  Date   CHOL 116 03/26/2024   Lab Results  Component Value Date   HDL 34 (L) 03/26/2024   Lab Results  Component Value Date   LDLCALC 56 03/26/2024   Lab Results  Component Value Date   TRIG 189 (H) 03/26/2024   Lab Results  Component Value Date   CHOLHDL 3.4 03/26/2024   Lab Results  Component Value Date   HGBA1C 8.3 (H) 03/26/2024       Assessment & Plan:  Anemia, unspecified type Assessment & Plan: Supplement and monitor   Orders: -     CBC with Differential/Platelet  Mixed hyperlipidemia Assessment & Plan: Tolerating statin, encouraged heart healthy diet, avoid trans fats, minimize simple carbs and saturated fats. Increase exercise as tolerated  Orders: -     Comprehensive metabolic panel with GFR -     Lipid panel  Primary hypertension Assessment & Plan: Well controlled, no changes to meds. Encouraged heart healthy diet such as the DASH diet and exercise as tolerated.    Orders: -     TSH  Muscle cramps Assessment & Plan: Hydrate and monitor    Type 2 diabetes mellitus with hyperglycemia, unspecified whether long term insulin  use (HCC) Assessment & Plan: Hgba1ccontinue to monitor, minimize simple carbs. Increase exercise as tolerated. Continue current meds   Orders: -     Hemoglobin A1c -     Microalbumin / creatinine urine ratio  Preventative health care Assessment & Plan: Patient encouraged to maintain heart healthy diet, regular exercise, adequate sleep. Consider daily probiotics. Take medications as prescribed. Labs ordered and reviewed. Shingrix is the new shingles shot, 2 shots over 2-6 months, prevnar 20 and  Respiratory Syncitial Virus vaccine are recommended.    Low magnesium level Assessment & Plan: Supplement and monitor   Orders: -     Magnesium -     Magnesium  Other orders -     Lidocaine -Prilocaine ; Apply 1 Application topically as needed.  Dispense: 30 g; Refill: 2    Randie Bustle, MD

## 2024-03-29 NOTE — Assessment & Plan Note (Signed)
 Supplement and monitor

## 2024-03-30 ENCOUNTER — Other Ambulatory Visit (HOSPITAL_BASED_OUTPATIENT_CLINIC_OR_DEPARTMENT_OTHER)

## 2024-03-30 DIAGNOSIS — M9901 Segmental and somatic dysfunction of cervical region: Secondary | ICD-10-CM | POA: Diagnosis not present

## 2024-03-30 DIAGNOSIS — M9902 Segmental and somatic dysfunction of thoracic region: Secondary | ICD-10-CM | POA: Diagnosis not present

## 2024-03-30 DIAGNOSIS — M9904 Segmental and somatic dysfunction of sacral region: Secondary | ICD-10-CM | POA: Diagnosis not present

## 2024-03-30 DIAGNOSIS — M9903 Segmental and somatic dysfunction of lumbar region: Secondary | ICD-10-CM | POA: Diagnosis not present

## 2024-03-30 DIAGNOSIS — M545 Low back pain, unspecified: Secondary | ICD-10-CM | POA: Diagnosis not present

## 2024-03-30 MED ORDER — EMPAGLIFLOZIN 10 MG PO TABS: 10.0000 mg | ORAL_TABLET | Freq: Every day | ORAL | 3 refills | Status: AC

## 2024-04-01 NOTE — Telephone Encounter (Signed)
 Patient returned RN's call regarding results.

## 2024-04-02 MED ORDER — ROSUVASTATIN CALCIUM 40 MG PO TABS
40.0000 mg | ORAL_TABLET | Freq: Every day | ORAL | 3 refills | Status: DC
Start: 2024-04-02 — End: 2024-06-17

## 2024-04-07 ENCOUNTER — Encounter: Payer: Self-pay | Admitting: *Deleted

## 2024-04-15 ENCOUNTER — Other Ambulatory Visit: Payer: Self-pay | Admitting: *Deleted

## 2024-04-15 DIAGNOSIS — R918 Other nonspecific abnormal finding of lung field: Secondary | ICD-10-CM

## 2024-04-21 ENCOUNTER — Encounter: Payer: Self-pay | Admitting: Family Medicine

## 2024-04-21 NOTE — Telephone Encounter (Signed)
 Forwarding to Dr. Joane and Ileana to review and advise.

## 2024-04-29 ENCOUNTER — Encounter: Payer: Self-pay | Admitting: Family Medicine

## 2024-04-30 ENCOUNTER — Other Ambulatory Visit: Payer: Self-pay

## 2024-04-30 DIAGNOSIS — M79671 Pain in right foot: Secondary | ICD-10-CM

## 2024-04-30 MED ORDER — CELECOXIB 100 MG PO CAPS: 100.0000 mg | ORAL_CAPSULE | Freq: Two times a day (BID) | ORAL | 1 refills | Status: DC | PRN

## 2024-05-04 DIAGNOSIS — M545 Low back pain, unspecified: Secondary | ICD-10-CM | POA: Diagnosis not present

## 2024-05-04 DIAGNOSIS — M9902 Segmental and somatic dysfunction of thoracic region: Secondary | ICD-10-CM | POA: Diagnosis not present

## 2024-05-04 DIAGNOSIS — M9904 Segmental and somatic dysfunction of sacral region: Secondary | ICD-10-CM | POA: Diagnosis not present

## 2024-05-04 DIAGNOSIS — M9903 Segmental and somatic dysfunction of lumbar region: Secondary | ICD-10-CM | POA: Diagnosis not present

## 2024-05-04 DIAGNOSIS — M9901 Segmental and somatic dysfunction of cervical region: Secondary | ICD-10-CM | POA: Diagnosis not present

## 2024-05-07 DIAGNOSIS — M9902 Segmental and somatic dysfunction of thoracic region: Secondary | ICD-10-CM | POA: Diagnosis not present

## 2024-05-07 DIAGNOSIS — M9901 Segmental and somatic dysfunction of cervical region: Secondary | ICD-10-CM | POA: Diagnosis not present

## 2024-05-07 DIAGNOSIS — M9903 Segmental and somatic dysfunction of lumbar region: Secondary | ICD-10-CM | POA: Diagnosis not present

## 2024-05-07 DIAGNOSIS — M545 Low back pain, unspecified: Secondary | ICD-10-CM | POA: Diagnosis not present

## 2024-05-07 DIAGNOSIS — M9904 Segmental and somatic dysfunction of sacral region: Secondary | ICD-10-CM | POA: Diagnosis not present

## 2024-05-08 NOTE — Progress Notes (Signed)
 HPI: Follow-up coronary calcification, hypertension and hyperlipidemia.  Echocardiogram June 2025 showed normal LV function, grade 1 diastolic dysfunction and trace aortic insufficiency.  Calcium  score June 2025 5543 which was 99th percentile; 4 mm right lower lobe nodule noted.  Since last seen the patient denies any dyspnea on exertion, orthopnea, PND, pedal edema, palpitations, syncope or chest pain.   Current Outpatient Medications  Medication Sig Dispense Refill   albuterol  (VENTOLIN  HFA) 108 (90 Base) MCG/ACT inhaler TAKE 2 PUFFS BY MOUTH EVERY 6 HOURS AS NEEDED FOR WHEEZE OR SHORTNESS OF BREATH 18 each 2   aspirin 81 MG chewable tablet Chew 81 mg by mouth daily.     azaTHIOprine  (IMURAN ) 50 MG tablet TAKE 4 AND 1/2 TAB BY MOUTH EVERY DAY 405 tablet 1   balsalazide (COLAZAL ) 750 MG capsule TAKE 3 CAPSULES(2250 MG) BY MOUTH THREE TIMES DAILY 270 capsule 2   celecoxib  (CELEBREX ) 100 MG capsule Take 1 capsule (100 mg total) by mouth 2 (two) times daily as needed. 60 capsule 1   colchicine  0.6 MG tablet 2 tabs po once then 1 tab po every 2 hours to max of 6 tabs in 24 hours or until pain relief or side effects then 1 tab po daily x 3 weeks 28 tablet 1   Continuous Blood Gluc Receiver (DEXCOM G6 RECEIVER) DEVI Use for continuous blood glucose monitoring 1 each 0   Continuous Blood Gluc Sensor (DEXCOM G6 SENSOR) MISC Use for continuous blood glucose monitoring.  Replace every 10 days. 4 each 0   Continuous Blood Gluc Transmit (DEXCOM G6 TRANSMITTER) MISC Use for continuous blood glucose monitoring.  Replace every 90 days 1 each 1   Continuous Glucose Sensor (DEXCOM G7 SENSOR) MISC Check blood sugar daily 2 each 5   Continuous Glucose Sensor (FREESTYLE LIBRE 3 SENSOR) MISC Place 1 sensor on the skin every 14 days. Use to check glucose continuously 1 each 0   Dulaglutide  (TRULICITY ) 3 MG/0.5ML SOAJ Inject 3 mg into the skin once a week. 6 mL 1   empagliflozin  (JARDIANCE ) 10 MG TABS tablet Take  1 tablet (10 mg total) by mouth daily before breakfast. 30 tablet 3   famotidine  (PEPCID ) 40 MG tablet Take 1 tablet (40 mg total) by mouth daily. 90 tablet 1   fenofibrate  160 MG tablet Take 1 tablet (160 mg total) by mouth daily. 90 tablet 1   fluticasone  (FLONASE ) 50 MCG/ACT nasal spray Place 2 sprays into both nostrils daily. 16 g 5   fluticasone  (FLOVENT  HFA) 110 MCG/ACT inhaler Inhale 2 puffs into the lungs 2 (two) times daily. 1 Inhaler 2   imiquimod  (ALDARA ) 5 % cream Apply topically 3 (three) times a week. 24 each 2   Insulin  Glargine (BASAGLAR  KWIKPEN) 100 UNIT/ML Inject 50 Units into the skin daily. 15 mL 4   Insulin  Pen Needle (BD PEN NEEDLE NANO 2ND GEN) 32G X 4 MM MISC USE 1 NEEDLE DAILY AS DIRECTED 100 each 11   Lancets (ONETOUCH DELICA PLUS LANCET30G) MISC USE AS DIRECTED TWICE DAILY TO CHECK BLOOD SUGAR 100 each 0   levocetirizine (XYZAL ) 5 MG tablet Take 1 tablet (5 mg total) by mouth every evening. 90 tablet 1   lidocaine -prilocaine  (EMLA ) cream Apply 1 Application topically as needed. 30 g 2   losartan  (COZAAR ) 100 MG tablet Take 1 tablet (100 mg total) by mouth daily. 90 tablet 1   meloxicam (MOBIC) 15 MG tablet Take 15 mg by mouth daily.  metFORMIN  (GLUCOPHAGE ) 1000 MG tablet Take 1 tablet (1,000 mg total) by mouth 2 (two) times daily with a meal. 180 tablet 1   montelukast  (SINGULAIR ) 10 MG tablet TAKE 1 TABLET BY MOUTH EVERYDAY AT BEDTIME 90 tablet 3   olopatadine  (PATANOL) 0.1 % ophthalmic solution Place 1 drop into both eyes 2 (two) times daily. 5 mL 3   rosuvastatin  (CRESTOR ) 40 MG tablet Take 1 tablet (40 mg total) by mouth daily. 90 tablet 3   tadalafil  (CIALIS ) 20 MG tablet Take 1 tablet (20 mg total) by mouth daily as needed. 30 tablet 11   tiZANidine  (ZANAFLEX ) 4 MG tablet Take 4 mg by mouth as needed.     methocarbamol  (ROBAXIN ) 500 MG tablet Take 1 tablet (500 mg total) by mouth every 6 (six) hours as needed for muscle spasms. (Patient not taking: Reported on  05/15/2024) 60 tablet 2   valACYclovir  (VALTREX ) 1000 MG tablet Take 1 tablet (1,000 mg total) by mouth 2 (two) times daily as needed. (Patient not taking: Reported on 05/15/2024) 60 tablet 3   No current facility-administered medications for this visit.     Past Medical History:  Diagnosis Date   Allergy    Anemia 07/02/2017   Arthritis 12/10/2022   Colitis, ulcerative (HCC) Dx'd 2001   Dr. Shana; [facility]--has been in remission since about 2009   Diabetes mellitus without complication (HCC) 58 yrs old   type 2 (dx'd after long courses of prednisone for his UC)   Erectile dysfunction    Family history of tinea corporis 07/25/2012   Gastroesophageal reflux disease 04/05/2022   Hyperlipidemia    Hypertension    Insomnia    Low back pain 08/07/2016   Preventative health care 12/31/2016   Right shoulder pain 12/31/2016   Sinusitis 08/28/2015    Past Surgical History:  Procedure Laterality Date   TRIGGER FINGER RELEASE  10/15/2009   b/l middle fingers    Social History   Socioeconomic History   Marital status: Married    Spouse name: Not on file   Number of children: Not on file   Years of education: Not on file   Highest education level: Some college, no degree  Occupational History   Not on file  Tobacco Use   Smoking status: Never   Smokeless tobacco: Never  Substance and Sexual Activity   Alcohol use: Yes    Comment: Rare   Drug use: Never   Sexual activity: Yes    Partners: Female    Birth control/protection: None    Comment: lives with wife, does office work follows with  Other Topics Concern   Not on file  Social History Narrative   Married, 60 y/o girl.     Lives in [place], grew up in [place] --relocated to [facility] 2007.   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         Social Drivers of Corporate investment banker Strain: Low Risk  (09/09/2023)   Overall Financial Resource Strain  (CARDIA)    Difficulty of Paying Living Expenses: Not hard at all  Food Insecurity: No Food Insecurity (09/09/2023)   Hunger Vital Sign    Worried About Running Out of Food in the Last Year: Never true    Ran Out of Food in the Last Year: Never true  Transportation Needs: No Transportation Needs (09/09/2023)   PRAPARE - Administrator, Civil Service (  Medical): No    Lack of Transportation (Non-Medical): No  Physical Activity: Unknown (09/09/2023)   Exercise Vital Sign    Days of Exercise per Week: 5 days    Minutes of Exercise per Session: Patient declined  Stress: No Stress Concern Present (09/09/2023)   [employer] of Occupational Health - Occupational Stress Questionnaire    Feeling of Stress : Only a little  Social Connections: Unknown (09/09/2023)   Social Connection and Isolation Panel    Frequency of Communication with Friends and Family: Three times a week    Frequency of Social Gatherings with Friends and Family: Once a week    Attends Religious Services: Patient declined    Database administrator or Organizations: No    Attends Engineer, structural: Not on file    Marital Status: Married  Intimate Partner Violence: Unknown (01/19/2023)   Received from [facility]   HITS    Physically Hurt: Not on file    Insult or Talk Down To: Not on file    Threaten Physical Harm: Not on file    Scream or Curse: Not on file    Family History  Problem Relation Age of Onset   Diabetes Father    Hyperlipidemia Father    Hypertension Father    Kidney disease Father        Kidney Failure   Heart disease Father 56       MI   Hypertension Sister    Hypertension Brother    Diabetes Paternal Grandfather    Hypertension Brother    Hyperlipidemia Brother    Heart disease Brother        Valve Surgery ?   Diabetes Brother    Hypertension Brother    ADD / ADHD Brother     ROS: no fevers or chills, productive cough, hemoptysis, dysphasia, odynophagia,  melena, hematochezia, dysuria, hematuria, rash, seizure activity, orthopnea, PND, pedal edema, claudication. Remaining systems are negative.  Physical Exam: Well-developed well-nourished in no acute distress.  Skin is warm and dry.  HEENT is normal.  Neck is supple.  Chest is clear to auscultation with normal expansion.  Cardiovascular exam is regular rate and rhythm.  Abdominal exam nontender or distended. No masses palpated. Extremities show no edema. neuro grossly intact EKG Interpretation Date/Time:  Friday May 15 2024 10:13:41 EDT Ventricular Rate:  85 PR Interval:  156 QRS Duration:  114 QT Interval:  378 QTC Calculation: 449 R Axis:   -15  Text Interpretation: Normal sinus rhythm Minimal voltage criteria for LVH, may be normal variant ( Cornell product ) Confirmed by Pietro Rogue ([id number]) on 05/15/2024 10:20:41 AM    A/P  1 coronary calcification-continue aspirin and Crestor .  2 hyperlipidemia-continue present medications.  Check lipids, liver functions and LP(a).  Note his liver functions were elevated recently prior to increasing the dose of Crestor .  Will recheck today.  May need to hold Crestor  pending follow-up LFTs.  3 hypertension-patient's blood pressure is controlled.  Continue present medications.  4 lung nodule-plan follow-up noncontrast chest CT June 2026.  Rogue Pietro, MD

## 2024-05-12 ENCOUNTER — Encounter: Payer: Self-pay | Admitting: Family Medicine

## 2024-05-12 DIAGNOSIS — E1165 Type 2 diabetes mellitus with hyperglycemia: Secondary | ICD-10-CM

## 2024-05-12 MED ORDER — BASAGLAR KWIKPEN 100 UNIT/ML ~~LOC~~ SOPN: 50.0000 [IU] | PEN_INJECTOR | Freq: Every day | SUBCUTANEOUS | 4 refills | Status: DC

## 2024-05-12 MED ORDER — MONTELUKAST SODIUM 10 MG PO TABS: ORAL_TABLET | ORAL | 3 refills | Status: AC

## 2024-05-15 ENCOUNTER — Encounter: Payer: Self-pay | Admitting: Cardiology

## 2024-05-15 ENCOUNTER — Ambulatory Visit: Attending: Cardiology | Admitting: Cardiology

## 2024-05-15 VITALS — BP 130/60 | HR 85 | Ht 66.0 in | Wt 175.6 lb

## 2024-05-15 DIAGNOSIS — E7800 Pure hypercholesterolemia, unspecified: Secondary | ICD-10-CM | POA: Diagnosis not present

## 2024-05-15 DIAGNOSIS — R0609 Other forms of dyspnea: Secondary | ICD-10-CM | POA: Diagnosis not present

## 2024-05-15 DIAGNOSIS — I2510 Atherosclerotic heart disease of native coronary artery without angina pectoris: Secondary | ICD-10-CM | POA: Diagnosis not present

## 2024-05-15 DIAGNOSIS — I10 Essential (primary) hypertension: Secondary | ICD-10-CM | POA: Diagnosis not present

## 2024-05-15 DIAGNOSIS — R911 Solitary pulmonary nodule: Secondary | ICD-10-CM | POA: Diagnosis not present

## 2024-05-15 NOTE — Patient Instructions (Addendum)
 Testing/Procedures:  CARDIAC PET- Your physician has requested that you have a Cardiac Pet Stress Test.   This testing is completed at [facility] ([address] [place], [place] [address]) or [facility] ([address], [place], [place]). Please arrive 30 minutes prior to your scheduled time.  The schedulers will call you to get this scheduled. Please follow further testing instructions below.   Follow-Up: At [facility], you and your health needs are our priority.  As part of our continuing mission to provide you with exceptional heart care, our providers are all part of one team.  This team includes your primary Cardiologist (physician) and Advanced Practice Providers or APPs (Physician Assistants and Nurse Practitioners) who all work together to provide you with the care you need, when you need it.  Your next appointment:   6 month(s)  Provider:   Redell Shallow MD   Other Instructions    Please report to Radiology at the [facility] Main Entrance 30 minutes early for your test.  [address] [place], [place] [address]                How to Prepare for Your Cardiac PET/CT Stress Test:  Nothing to eat or drink, except water, 3 hours prior to arrival time.  NO caffeine/decaffeinated products, or chocolate 12 hours prior to arrival. (Please note decaffeinated beverages (teas/coffees) still contain caffeine).  If you have caffeine within 12 hours prior, the test will need to be rescheduled.  Medication instructions: Do not take erectile dysfunction medications for 72 hours prior to test (sildenafil , tadalafil )  Diabetic Preparation: If able to eat breakfast prior to 3 hour fasting, you may take all medications, including your insulin . Do not worry if you miss your breakfast dose of insulin  - start at your next meal. If you do not eat prior to 3 hour fast-Hold all diabetes (oral and insulin )  medications. Patients who wear a continuous glucose monitor MUST remove the device prior to scanning.  You may take your remaining medications with water.  NO perfume, cologne or lotion on chest or abdomen area.  Total time is 1 to 2 hours; you may want to bring reading material for the waiting time.  IF YOU THINK YOU MAY BE PREGNANT, OR ARE NURSING PLEASE INFORM THE TECHNOLOGIST.  In preparation for your appointment, medication and supplies will be purchased.  Appointment availability is limited, so if you need to cancel or reschedule, please call the Radiology Department Scheduler at [phone] 24 hours in advance to avoid a cancellation fee of $100.00  What to Expect When you Arrive:  Once you arrive and check in for your appointment, you will be taken to a preparation room within the Radiology Department.  A technologist or Nurse will obtain your medical history, verify that you are correctly prepped for the exam, and explain the procedure.  Afterwards, an IV will be started in your arm and electrodes will be placed on your skin for EKG monitoring during the stress portion of the exam. Then you will be escorted to the PET/CT scanner.  There, staff will get you positioned on the scanner and obtain a blood pressure and EKG.  During the exam, you will continue to be connected to the EKG and blood pressure machines.  A small, safe amount of a radioactive tracer will be injected in your IV to obtain a series of pictures of your heart along with an injection of  a stress agent.    After your Exam:  It is recommended that you eat a meal and drink a caffeinated beverage to counter act any effects of the stress agent.  Drink plenty of fluids for the remainder of the day and urinate frequently for the first couple of hours after the exam.  Your doctor will inform you of your test results within 7-10 business days.  For more information and frequently asked questions, please visit our  website: [email or web address]  For questions about your test or how to prepare for your test, please call: Cardiac Imaging Nurse Navigators Office: [phone]

## 2024-05-16 ENCOUNTER — Ambulatory Visit: Payer: Self-pay | Admitting: Cardiology

## 2024-05-16 DIAGNOSIS — E7800 Pure hypercholesterolemia, unspecified: Secondary | ICD-10-CM

## 2024-05-16 LAB — HEPATIC FUNCTION PANEL
ALT: 77 IU/L — ABNORMAL HIGH (ref 0–44)
AST: 79 IU/L — ABNORMAL HIGH (ref 0–40)
Albumin: 4.4 g/dL (ref 3.8–4.9)
Alkaline Phosphatase: 88 IU/L (ref 44–121)
Bilirubin Total: 1 mg/dL (ref 0.0–1.2)
Bilirubin, Direct: 0.38 mg/dL (ref 0.00–0.40)
Total Protein: 6.7 g/dL (ref 6.0–8.5)

## 2024-05-16 LAB — LIPOPROTEIN A (LPA): Lipoprotein (a): <8.4 nmol/L (ref <75.0)

## 2024-05-16 LAB — LIPID PANEL
Chol/HDL Ratio: 3.9 ratio (ref 0.0–5.0)
Cholesterol, Total: 93 mg/dL — ABNORMAL LOW (ref 100–199)
HDL: 24 mg/dL — ABNORMAL LOW (ref >39)
LDL Chol Calc (NIH): 39 mg/dL (ref 0–99)
Triglycerides: 179 mg/dL — ABNORMAL HIGH (ref 0–149)
VLDL Cholesterol Cal: 30 mg/dL (ref 5–40)

## 2024-05-18 DIAGNOSIS — M9904 Segmental and somatic dysfunction of sacral region: Secondary | ICD-10-CM | POA: Diagnosis not present

## 2024-05-18 DIAGNOSIS — M545 Low back pain, unspecified: Secondary | ICD-10-CM | POA: Diagnosis not present

## 2024-05-18 DIAGNOSIS — M9903 Segmental and somatic dysfunction of lumbar region: Secondary | ICD-10-CM | POA: Diagnosis not present

## 2024-05-18 DIAGNOSIS — M9902 Segmental and somatic dysfunction of thoracic region: Secondary | ICD-10-CM | POA: Diagnosis not present

## 2024-05-18 DIAGNOSIS — M9901 Segmental and somatic dysfunction of cervical region: Secondary | ICD-10-CM | POA: Diagnosis not present

## 2024-05-19 ENCOUNTER — Telehealth: Payer: Self-pay | Admitting: Family Medicine

## 2024-05-19 DIAGNOSIS — H25813 Combined forms of age-related cataract, bilateral: Secondary | ICD-10-CM | POA: Diagnosis not present

## 2024-05-19 DIAGNOSIS — M79671 Pain in right foot: Secondary | ICD-10-CM

## 2024-05-19 DIAGNOSIS — E119 Type 2 diabetes mellitus without complications: Secondary | ICD-10-CM | POA: Diagnosis not present

## 2024-05-19 DIAGNOSIS — H40013 Open angle with borderline findings, low risk, bilateral: Secondary | ICD-10-CM | POA: Diagnosis not present

## 2024-05-19 DIAGNOSIS — H43392 Other vitreous opacities, left eye: Secondary | ICD-10-CM | POA: Diagnosis not present

## 2024-05-19 LAB — HM DIABETES EYE EXAM

## 2024-05-19 NOTE — Telephone Encounter (Signed)
 Patient was not able to have PT while in [place]  due to it not being covered by insurance. He would like to be referred to [facility] in [place].

## 2024-05-28 ENCOUNTER — Encounter: Payer: Self-pay | Admitting: Family Medicine

## 2024-05-28 DIAGNOSIS — E119 Type 2 diabetes mellitus without complications: Secondary | ICD-10-CM

## 2024-05-28 DIAGNOSIS — E785 Hyperlipidemia, unspecified: Secondary | ICD-10-CM

## 2024-05-28 DIAGNOSIS — I10 Essential (primary) hypertension: Secondary | ICD-10-CM

## 2024-05-29 DIAGNOSIS — N529 Male erectile dysfunction, unspecified: Secondary | ICD-10-CM | POA: Diagnosis not present

## 2024-05-29 MED ORDER — AZATHIOPRINE 50 MG PO TABS
ORAL_TABLET | ORAL | 2 refills | Status: AC
Start: 2024-05-29 — End: ?

## 2024-06-04 DIAGNOSIS — M9904 Segmental and somatic dysfunction of sacral region: Secondary | ICD-10-CM | POA: Diagnosis not present

## 2024-06-04 DIAGNOSIS — M9903 Segmental and somatic dysfunction of lumbar region: Secondary | ICD-10-CM | POA: Diagnosis not present

## 2024-06-04 DIAGNOSIS — M9901 Segmental and somatic dysfunction of cervical region: Secondary | ICD-10-CM | POA: Diagnosis not present

## 2024-06-04 DIAGNOSIS — M545 Low back pain, unspecified: Secondary | ICD-10-CM | POA: Diagnosis not present

## 2024-06-04 DIAGNOSIS — M9902 Segmental and somatic dysfunction of thoracic region: Secondary | ICD-10-CM | POA: Diagnosis not present

## 2024-06-10 DIAGNOSIS — M25572 Pain in left ankle and joints of left foot: Secondary | ICD-10-CM | POA: Diagnosis not present

## 2024-06-10 DIAGNOSIS — M25571 Pain in right ankle and joints of right foot: Secondary | ICD-10-CM | POA: Diagnosis not present

## 2024-06-17 ENCOUNTER — Other Ambulatory Visit: Payer: Self-pay | Admitting: Family Medicine

## 2024-06-17 DIAGNOSIS — M25572 Pain in left ankle and joints of left foot: Secondary | ICD-10-CM | POA: Diagnosis not present

## 2024-06-17 DIAGNOSIS — M25571 Pain in right ankle and joints of right foot: Secondary | ICD-10-CM | POA: Diagnosis not present

## 2024-06-17 MED ORDER — ROSUVASTATIN CALCIUM 5 MG PO TABS: 5.0000 mg | ORAL_TABLET | Freq: Every day | ORAL | 1 refills | Status: AC

## 2024-06-18 DIAGNOSIS — M545 Low back pain, unspecified: Secondary | ICD-10-CM | POA: Diagnosis not present

## 2024-06-18 DIAGNOSIS — M9904 Segmental and somatic dysfunction of sacral region: Secondary | ICD-10-CM | POA: Diagnosis not present

## 2024-06-18 DIAGNOSIS — M9902 Segmental and somatic dysfunction of thoracic region: Secondary | ICD-10-CM | POA: Diagnosis not present

## 2024-06-18 DIAGNOSIS — M9901 Segmental and somatic dysfunction of cervical region: Secondary | ICD-10-CM | POA: Diagnosis not present

## 2024-06-18 DIAGNOSIS — M9903 Segmental and somatic dysfunction of lumbar region: Secondary | ICD-10-CM | POA: Diagnosis not present

## 2024-06-24 DIAGNOSIS — M25572 Pain in left ankle and joints of left foot: Secondary | ICD-10-CM | POA: Diagnosis not present

## 2024-06-24 DIAGNOSIS — M25571 Pain in right ankle and joints of right foot: Secondary | ICD-10-CM | POA: Diagnosis not present

## 2024-06-30 ENCOUNTER — Telehealth (HOSPITAL_COMMUNITY): Payer: Self-pay | Admitting: *Deleted

## 2024-06-30 DIAGNOSIS — M9901 Segmental and somatic dysfunction of cervical region: Secondary | ICD-10-CM | POA: Diagnosis not present

## 2024-06-30 DIAGNOSIS — M9902 Segmental and somatic dysfunction of thoracic region: Secondary | ICD-10-CM | POA: Diagnosis not present

## 2024-06-30 DIAGNOSIS — M545 Low back pain, unspecified: Secondary | ICD-10-CM | POA: Diagnosis not present

## 2024-06-30 DIAGNOSIS — M9903 Segmental and somatic dysfunction of lumbar region: Secondary | ICD-10-CM | POA: Diagnosis not present

## 2024-06-30 DIAGNOSIS — M9904 Segmental and somatic dysfunction of sacral region: Secondary | ICD-10-CM | POA: Diagnosis not present

## 2024-06-30 NOTE — Telephone Encounter (Signed)
 Error- please disregard

## 2024-07-02 DIAGNOSIS — N529 Male erectile dysfunction, unspecified: Secondary | ICD-10-CM | POA: Diagnosis not present

## 2024-07-03 NOTE — Progress Notes (Signed)
 I, [name], CMA acting as a scribe for [name], MD.  Curtis Kennedy is a 58 y.o. male who presents to [facility] today for f/u bilat foot pain. Pt was last seen by Dr. Lloyd on 03/20/24 and was prescribed Celebrex  and referred to PT in [place] , but his insurance was not accepted. Send HEP through [employer]. When he returned to [facility] a new PT referral was placed to [name].   Today, pt reports continued moderate foot pain. Sx exacerbated with activity. Has been doing PT and HEP. Feet are really does after PT. Short-term relief with Celebrex . Has trip planned in [place] in about 1 week.   Dx testing: 12/26/23 R foot XR 09/10/23 R foot XR  Pertinent review of systems: No fevers or chills  Relevant historical information: Ulcerative colitis and diabetes.   Exam:  BP (!) 150/84   Pulse 81   Ht 5' 6 (1.676 m)   Wt 176 lb (79.8 kg)   SpO2 98%   BMI 28.41 kg/m  General: Well Developed, well nourished, and in no acute distress.   MSK: Feet and ankles bilaterally are normal-appearing Mild send palpation anterior ankle.    Lab and Radiology Results  Procedure: Real-time Ultrasound Guided Injection of right ankle anterior lateral approach Device: Philips Affiniti 50G/GE Logiq Images permanently stored and available for review in PACS Verbal informed consent obtained.  Discussed risks and benefits of procedure. Warned about infection, bleeding, hyperglycemia damage to structures among others. Patient expresses understanding and agreement Time-out conducted.   Noted no overlying erythema, induration, or other signs of local infection.   Skin prepped in a sterile fashion.   Local anesthesia: Topical Ethyl chloride.   With sterile technique and under real time ultrasound guidance: 40 mg of Kenalog and 2 mL of Marcaine injected into ankle joint. Fluid seen entering the joint capsule.   Completed without difficulty   Pain immediately resolved  suggesting accurate placement of the medication.   Advised to call if fevers/chills, erythema, induration, drainage, or persistent bleeding.   Images permanently stored and available for review in the ultrasound unit.  Impression: Technically successful ultrasound guided injection.    Procedure: Real-time Ultrasound Guided Injection of left anterior ankle lateral approach Device: Philips Affiniti 50G/GE Logiq Images permanently stored and available for review in PACS Verbal informed consent obtained.  Discussed risks and benefits of procedure. Warned about infection, bleeding, hyperglycemia damage to structures among others. Patient expresses understanding and agreement Time-out conducted.   Noted no overlying erythema, induration, or other signs of local infection.   Skin prepped in a sterile fashion.   Local anesthesia: Topical Ethyl chloride.   With sterile technique and under real time ultrasound guidance: 40 mg of Kenalog and 2 mL of Marcaine injected into ankle joint. Fluid seen entering the joint capsule.   Completed without difficulty   Pain immediately resolved suggesting accurate placement of the medication.   Advised to call if fevers/chills, erythema, induration, drainage, or persistent bleeding.   Images permanently stored and available for review in the ultrasound unit.  Impression: Technically successful ultrasound guided injection.    X-ray images bilateral ankles obtained today personally and independently interpreted.  Right: Minimal degenerative changes.  No acute fractures are visible.  Left: Minimal degenerative changes.  No acute fractures are visible.  Await formal radiology review    Assessment and Plan: 58 y.o. male with bilateral anterior ankle pain.  Not improved with home exercise  program.  Plan for steroid injection prior to his trip.  If not improved consider advanced imaging or formal physical therapy.   PDMP not reviewed this encounter. Orders  Placed This Encounter  Procedures   US  LIMITED JOINT SPACE STRUCTURES LOW BILAT(NO LINKED CHARGES)    Reason for Exam (SYMPTOM  OR DIAGNOSIS REQUIRED):   bilat foot pain    Preferred imaging location?:   [facility] Ankle Complete Right    Standing Status:   Future    Number of Occurrences:   1    Expiration Date:   07/06/2025    Reason for Exam (SYMPTOM  OR DIAGNOSIS REQUIRED):   bilat ankle pain    Preferred imaging location?:   [facility] [facility] Ankle Complete Left    Standing Status:   Future    Number of Occurrences:   1    Expiration Date:   07/06/2025    Reason for Exam (SYMPTOM  OR DIAGNOSIS REQUIRED):   bilat ankle pain    Preferred imaging location?:   [facility]   No orders of the defined types were placed in this encounter.    Discussed warning signs or symptoms. Please see discharge instructions. Patient expresses understanding.   The above documentation has been reviewed and is accurate and complete [name], M.D.

## 2024-07-06 ENCOUNTER — Ambulatory Visit: Payer: Self-pay

## 2024-07-06 ENCOUNTER — Other Ambulatory Visit: Payer: Self-pay | Admitting: Family Medicine

## 2024-07-06 ENCOUNTER — Encounter: Payer: Self-pay | Admitting: Family Medicine

## 2024-07-06 ENCOUNTER — Encounter: Payer: Self-pay | Admitting: *Deleted

## 2024-07-06 ENCOUNTER — Other Ambulatory Visit: Payer: Self-pay | Admitting: *Deleted

## 2024-07-06 ENCOUNTER — Ambulatory Visit (INDEPENDENT_AMBULATORY_CARE_PROVIDER_SITE_OTHER)

## 2024-07-06 ENCOUNTER — Ambulatory Visit: Admitting: Family Medicine

## 2024-07-06 VITALS — BP 150/84 | HR 81 | Ht 66.0 in | Wt 176.0 lb

## 2024-07-06 DIAGNOSIS — M25571 Pain in right ankle and joints of right foot: Secondary | ICD-10-CM

## 2024-07-06 DIAGNOSIS — M79671 Pain in right foot: Secondary | ICD-10-CM

## 2024-07-06 DIAGNOSIS — G8929 Other chronic pain: Secondary | ICD-10-CM | POA: Diagnosis not present

## 2024-07-06 DIAGNOSIS — M25572 Pain in left ankle and joints of left foot: Secondary | ICD-10-CM | POA: Diagnosis not present

## 2024-07-06 DIAGNOSIS — M79672 Pain in left foot: Secondary | ICD-10-CM | POA: Diagnosis not present

## 2024-07-06 DIAGNOSIS — R0609 Other forms of dyspnea: Secondary | ICD-10-CM

## 2024-07-06 DIAGNOSIS — I10 Essential (primary) hypertension: Secondary | ICD-10-CM

## 2024-07-06 DIAGNOSIS — E119 Type 2 diabetes mellitus without complications: Secondary | ICD-10-CM

## 2024-07-06 DIAGNOSIS — E785 Hyperlipidemia, unspecified: Secondary | ICD-10-CM

## 2024-07-06 NOTE — Patient Instructions (Addendum)
 Thank you for coming in today.  You received an injection today. Seek immediate medical attention if the joint becomes red, extremely painful, or is oozing fluid.  Please get an Xray today before you leave

## 2024-07-06 NOTE — Telephone Encounter (Signed)
 Rx refill request approved per Dr. Zollie Pee orders.

## 2024-07-10 ENCOUNTER — Ambulatory Visit: Payer: Self-pay | Admitting: Family Medicine

## 2024-07-10 NOTE — Progress Notes (Signed)
 Right ankle x-ray shows no broken bones.  No arthritis is visible.

## 2024-07-10 NOTE — Progress Notes (Signed)
 Left ankle x-ray shows no broken bones.  No significant arthritis.

## 2024-07-14 ENCOUNTER — Encounter (HOSPITAL_COMMUNITY): Payer: Self-pay | Admitting: Cardiology

## 2024-08-17 ENCOUNTER — Encounter: Payer: Self-pay | Admitting: Family Medicine

## 2024-08-17 MED ORDER — ALBUTEROL SULFATE HFA 108 (90 BASE) MCG/ACT IN AERS: 2.0000 | INHALATION_SPRAY | Freq: Four times a day (QID) | RESPIRATORY_TRACT | 5 refills | Status: AC | PRN

## 2024-08-19 DIAGNOSIS — M545 Low back pain, unspecified: Secondary | ICD-10-CM | POA: Diagnosis not present

## 2024-08-19 DIAGNOSIS — M9902 Segmental and somatic dysfunction of thoracic region: Secondary | ICD-10-CM | POA: Diagnosis not present

## 2024-08-19 DIAGNOSIS — M9904 Segmental and somatic dysfunction of sacral region: Secondary | ICD-10-CM | POA: Diagnosis not present

## 2024-08-19 DIAGNOSIS — M9901 Segmental and somatic dysfunction of cervical region: Secondary | ICD-10-CM | POA: Diagnosis not present

## 2024-08-19 DIAGNOSIS — M9903 Segmental and somatic dysfunction of lumbar region: Secondary | ICD-10-CM | POA: Diagnosis not present

## 2024-08-26 DIAGNOSIS — L821 Other seborrheic keratosis: Secondary | ICD-10-CM | POA: Diagnosis not present

## 2024-08-26 DIAGNOSIS — D1801 Hemangioma of skin and subcutaneous tissue: Secondary | ICD-10-CM | POA: Diagnosis not present

## 2024-08-26 DIAGNOSIS — L814 Other melanin hyperpigmentation: Secondary | ICD-10-CM | POA: Diagnosis not present

## 2024-08-26 DIAGNOSIS — L820 Inflamed seborrheic keratosis: Secondary | ICD-10-CM | POA: Diagnosis not present

## 2024-08-26 DIAGNOSIS — L570 Actinic keratosis: Secondary | ICD-10-CM | POA: Diagnosis not present

## 2024-08-26 DIAGNOSIS — Z08 Encounter for follow-up examination after completed treatment for malignant neoplasm: Secondary | ICD-10-CM | POA: Diagnosis not present

## 2024-08-26 DIAGNOSIS — Z85828 Personal history of other malignant neoplasm of skin: Secondary | ICD-10-CM | POA: Diagnosis not present

## 2024-08-26 DIAGNOSIS — L578 Other skin changes due to chronic exposure to nonionizing radiation: Secondary | ICD-10-CM | POA: Diagnosis not present

## 2024-08-26 DIAGNOSIS — D225 Melanocytic nevi of trunk: Secondary | ICD-10-CM | POA: Diagnosis not present

## 2024-09-09 ENCOUNTER — Encounter: Payer: Self-pay | Admitting: Family Medicine

## 2024-09-09 DIAGNOSIS — I10 Essential (primary) hypertension: Secondary | ICD-10-CM

## 2024-09-09 DIAGNOSIS — E785 Hyperlipidemia, unspecified: Secondary | ICD-10-CM

## 2024-09-09 DIAGNOSIS — M79671 Pain in right foot: Secondary | ICD-10-CM

## 2024-09-09 DIAGNOSIS — E119 Type 2 diabetes mellitus without complications: Secondary | ICD-10-CM

## 2024-09-09 MED ORDER — CELECOXIB 100 MG PO CAPS: 100.0000 mg | ORAL_CAPSULE | Freq: Two times a day (BID) | ORAL | 0 refills | Status: DC | PRN

## 2024-09-09 MED ORDER — BALSALAZIDE DISODIUM 750 MG PO CAPS: 2250.0000 mg | ORAL_CAPSULE | Freq: Three times a day (TID) | ORAL | 1 refills | Status: DC

## 2024-09-09 MED ORDER — TRULICITY 3 MG/0.5ML ~~LOC~~ SOAJ: 3.0000 mg | SUBCUTANEOUS | 1 refills | Status: AC

## 2024-09-13 NOTE — Progress Notes (Unsigned)
 Subjective:    Patient ID: Curtis Kennedy, male    DOB: [birth year], 58 y.o.   MRN: [id number]  No chief complaint on file.   HPI Discussed the use of AI scribe software for clinical note transcription with the patient, who gave verbal consent to proceed.  History of Present Illness Curtis Kennedy Curtis Kennedy is a 58 year old male with diabetes and coronary artery disease who presents for a follow-up visit.  He is experiencing difficulty maintaining control over his diabetes due to irregular eating habits while traveling. He has not been checking his blood sugar levels regularly and is concerned about potential hyperglycemic episodes. He takes insulin  once a day and wants a glucometer to monitor his levels more closely.  He experiences leg cramps regularly and is trying to manage hydration and magnesium intake.  He has a history of a high coronary artery calcium  score, indicating significant plaque buildup per prior heart testing. He is awaiting further testing to assess the blood flow to his heart muscle under stress. He takes an 80 mg aspirin daily.  His wife is dealing with her mother's severe dementia, which has progressed significantly. His mother-in-law has lost eight pounds in three weeks and is not eating or drinking well, leading to a diagnosis of failure to thrive. The family is considering moving her to a memory care facility. His father-in-law is also struggling with the situation, feeling lonely and overwhelmed.  He experienced a severe influenza episode despite receiving a flu shot, which resulted in him being bedridden for six days. He is cautious about taking additional vaccines due to potential side effects that could interfere with his ability to manage family responsibilities.    Past Medical History:  Diagnosis Date   Allergy    Anemia 07/02/2017   Arthritis 12/10/2022   Colitis, ulcerative (HCC) Dx'd 2001   Dr. Shana; [facility]--has been in remission since about  2009   Diabetes mellitus without complication (HCC) 58 yrs old   type 2 (dx'd after long courses of prednisone for his UC)   Erectile dysfunction    Family history of tinea corporis 07/25/2012   Gastroesophageal reflux disease 04/05/2022   Hyperlipidemia    Hypertension    Insomnia    Low back pain 08/07/2016   Preventative health care 12/31/2016   Right shoulder pain 12/31/2016   Sinusitis 08/28/2015    Past Surgical History:  Procedure Laterality Date   TRIGGER FINGER RELEASE  10/15/2009   b/l middle fingers    Family History  Problem Relation Age of Onset   Diabetes Father    Hyperlipidemia Father    Hypertension Father    Kidney disease Father        Kidney Failure   Heart disease Father 92       MI   Hypertension Sister    Hypertension Brother    Diabetes Paternal Grandfather    Hypertension Brother    Hyperlipidemia Brother    Heart disease Brother        Valve Surgery ?   Diabetes Brother    Hypertension Brother    ADD / ADHD Brother     Social History   Socioeconomic History   Marital status: Married    Spouse name: Not on file   Number of children: Not on file   Years of education: Not on file   Highest education level: Some college, no degree  Occupational History   Not on file  Tobacco Use  Smoking status: Never   Smokeless tobacco: Never  Substance and Sexual Activity   Alcohol use: Yes    Comment: Rare   Drug use: Never   Sexual activity: Yes    Partners: Female    Birth control/protection: None    Comment: lives with wife, does office work follows with  Other Topics Concern   Not on file  Social History Narrative   Married, 60 y/o girl.     Lives in [place], grew up in [place] --relocated to [facility].   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         Social Drivers of Corporate Investment Banker Strain: Low Risk  (09/09/2024)   Overall Financial Resource  Strain (CARDIA)    Difficulty of Paying Living Expenses: Not hard at all  Food Insecurity: No Food Insecurity (09/09/2024)   Hunger Vital Sign    Worried About Running Out of Food in the Last Year: Never true    Ran Out of Food in the Last Year: Never true  Transportation Needs: No Transportation Needs (09/09/2024)   PRAPARE - Administrator, Civil Service (Medical): No    Lack of Transportation (Non-Medical): No  Physical Activity: Sufficiently Active (09/09/2024)   Exercise Vital Sign    Days of Exercise per Week: 4 days    Minutes of Exercise per Session: 90 min  Stress: No Stress Concern Present (09/09/2024)   [employer] of Occupational Health - Occupational Stress Questionnaire    Feeling of Stress: Only a little  Social Connections: Moderately Isolated (09/09/2024)   Social Connection and Isolation Panel    Frequency of Communication with Friends and Family: More than three times a week    Frequency of Social Gatherings with Friends and Family: Once a week    Attends Religious Services: Patient declined    Database Administrator or Organizations: No    Attends Engineer, Structural: Not on file    Marital Status: Married  Intimate Partner Violence: Unknown (01/19/2023)   Received from [facility]   HITS    Physically Hurt: Not on file    Insult or Talk Down To: Not on file    Threaten Physical Harm: Not on file    Scream or Curse: Not on file    Outpatient Medications Prior to Visit  Medication Sig Dispense Refill   albuterol  (VENTOLIN  HFA) 108 (90 Base) MCG/ACT inhaler Inhale 2 puffs into the lungs every 6 (six) hours as needed for wheezing or shortness of breath. 18 g 5   aspirin 81 MG chewable tablet Chew 81 mg by mouth daily.     azaTHIOprine  (IMURAN ) 50 MG tablet TAKE 4 AND 1/2 TAB BY MOUTH EVERY DAY 405 tablet 2   balsalazide (COLAZAL ) 750 MG capsule Take 3 capsules (2,250 mg total) by mouth 3 (three) times daily. 270 capsule 1   celecoxib   (CELEBREX ) 100 MG capsule Take 1 capsule (100 mg total) by mouth 2 (two) times daily as needed. 60 capsule 0   colchicine  0.6 MG tablet 2 tabs po once then 1 tab po every 2 hours to max of 6 tabs in 24 hours or until pain relief or side effects then 1 tab po daily x 3 weeks 28 tablet 1   Continuous Blood Gluc Receiver (DEXCOM G6 RECEIVER) DEVI Use for continuous blood glucose monitoring 1 each 0   Continuous Blood Gluc  Sensor (DEXCOM G6 SENSOR) MISC Use for continuous blood glucose monitoring.  Replace every 10 days. 4 each 0   Continuous Blood Gluc Transmit (DEXCOM G6 TRANSMITTER) MISC Use for continuous blood glucose monitoring.  Replace every 90 days 1 each 1   Continuous Glucose Sensor (DEXCOM G7 SENSOR) MISC Check blood sugar daily 2 each 5   Continuous Glucose Sensor (FREESTYLE LIBRE 3 SENSOR) MISC Place 1 sensor on the skin every 14 days. Use to check glucose continuously 1 each 0   Dulaglutide  (TRULICITY ) 3 MG/0.5ML SOAJ Inject 3 mg into the skin once a week. 6 mL 1   empagliflozin  (JARDIANCE ) 10 MG TABS tablet Take 1 tablet (10 mg total) by mouth daily before breakfast. 30 tablet 3   famotidine  (PEPCID ) 40 MG tablet Take 1 tablet (40 mg total) by mouth daily. 90 tablet 1   fenofibrate  160 MG tablet Take 1 tablet (160 mg total) by mouth daily. 90 tablet 1   fluticasone  (FLONASE ) 50 MCG/ACT nasal spray Place 2 sprays into both nostrils daily. 16 g 5   fluticasone  (FLOVENT  HFA) 110 MCG/ACT inhaler Inhale 2 puffs into the lungs 2 (two) times daily. 1 Inhaler 2   imiquimod  (ALDARA ) 5 % cream Apply topically 3 (three) times a week. 24 each 2   Insulin  Glargine (BASAGLAR  KWIKPEN) 100 UNIT/ML Inject 50 Units into the skin daily. 15 mL 4   Insulin  Pen Needle (BD PEN NEEDLE NANO 2ND GEN) 32G X 4 MM MISC USE 1 NEEDLE DAILY AS DIRECTED 100 each 11   Lancets (ONETOUCH DELICA PLUS LANCET30G) MISC USE AS DIRECTED TWICE DAILY TO CHECK BLOOD SUGAR 100 each 0   levocetirizine (XYZAL ) 5 MG tablet Take 1  tablet (5 mg total) by mouth every evening. 90 tablet 1   lidocaine -prilocaine  (EMLA ) cream Apply 1 Application topically as needed. 30 g 2   losartan  (COZAAR ) 100 MG tablet Take 1 tablet (100 mg total) by mouth daily. 90 tablet 1   meloxicam (MOBIC) 15 MG tablet Take 15 mg by mouth daily.     metFORMIN  (GLUCOPHAGE ) 1000 MG tablet Take 1 tablet (1,000 mg total) by mouth 2 (two) times daily with a meal. 180 tablet 1   methocarbamol  (ROBAXIN ) 500 MG tablet Take 1 tablet (500 mg total) by mouth every 6 (six) hours as needed for muscle spasms. 60 tablet 2   montelukast  (SINGULAIR ) 10 MG tablet TAKE 1 TABLET BY MOUTH EVERYDAY AT BEDTIME 90 tablet 3   olopatadine  (PATANOL) 0.1 % ophthalmic solution Place 1 drop into both eyes 2 (two) times daily. 5 mL 3   rosuvastatin  (CRESTOR ) 5 MG tablet Take 1 tablet (5 mg total) by mouth daily. 90 tablet 1   tadalafil  (CIALIS ) 20 MG tablet Take 1 tablet (20 mg total) by mouth daily as needed. 30 tablet 11   tiZANidine  (ZANAFLEX ) 4 MG tablet Take 4 mg by mouth as needed.     valACYclovir  (VALTREX ) 1000 MG tablet Take 1 tablet (1,000 mg total) by mouth 2 (two) times daily as needed. 60 tablet 3   No facility-administered medications prior to visit.    Allergies  Allergen Reactions   Penicillins Hives   Simvastatin     Myalgias    Review of Systems  Constitutional:  Negative for fever and malaise/fatigue.  HENT:  Negative for congestion.   Eyes:  Negative for blurred vision.  Respiratory:  Negative for shortness of breath.   Cardiovascular:  Negative for chest pain, palpitations and leg swelling.  Gastrointestinal:  Negative for abdominal pain, blood in stool and nausea.  Genitourinary:  Negative for dysuria and frequency.  Musculoskeletal:  Negative for falls.  Skin:  Negative for rash.  Neurological:  Negative for dizziness, loss of consciousness and headaches.  Endo/Heme/Allergies:  Negative for environmental allergies.  Psychiatric/Behavioral:   Negative for depression. The patient is not nervous/anxious.        Objective:    Physical Exam Vitals reviewed.  Constitutional:      Appearance: Normal appearance. He is not ill-appearing.  HENT:     Head: Normocephalic and atraumatic.     Nose: Nose normal.  Eyes:     Conjunctiva/sclera: Conjunctivae normal.  Cardiovascular:     Rate and Rhythm: Normal rate.     Pulses: Normal pulses.     Heart sounds: Normal heart sounds. No murmur heard. Pulmonary:     Effort: Pulmonary effort is normal.     Breath sounds: Normal breath sounds. No wheezing.  Abdominal:     Palpations: Abdomen is soft. There is no mass.     Tenderness: There is no abdominal tenderness.  Musculoskeletal:     Cervical back: Normal range of motion.     Right lower leg: No edema.     Left lower leg: No edema.  Skin:    General: Skin is warm and dry.  Neurological:     General: No focal deficit present.     Mental Status: He is alert and oriented to person, place, and time.  Psychiatric:        Mood and Affect: Mood normal.    There were no vitals taken for this visit. Wt Readings from Last 3 Encounters:  07/06/24 176 lb (79.8 kg)  05/15/24 175 lb 9.6 oz (79.7 kg)  03/26/24 176 lb (79.8 kg)    Diabetic Foot Exam - Simple   No data filed    Lab Results  Component Value Date   WBC 5.4 03/26/2024   HGB 14.1 03/26/2024   HCT 41.9 03/26/2024   PLT 206 03/26/2024   GLUCOSE 271 (H) 03/26/2024   CHOL 93 (L) 05/15/2024   TRIG 179 (H) 05/15/2024   HDL 24 (L) 05/15/2024   LDLCALC 39 05/15/2024   ALT 77 (H) 05/15/2024   AST 79 (H) 05/15/2024   NA 137 03/26/2024   K 4.0 03/26/2024   CL 100 03/26/2024   CREATININE 0.88 03/26/2024   BUN 14 03/26/2024   CO2 25 03/26/2024   TSH 1.08 03/26/2024   PSA 1.05 12/10/2022   HGBA1C 8.3 (H) 03/26/2024   MICROALBUR 88.9 03/26/2024    Lab Results  Component Value Date   TSH 1.08 03/26/2024   Lab Results  Component Value Date   WBC 5.4 03/26/2024    HGB 14.1 03/26/2024   HCT 41.9 03/26/2024   MCV 100.7 (H) 03/26/2024   PLT 206 03/26/2024   Lab Results  Component Value Date   NA 137 03/26/2024   K 4.0 03/26/2024   CO2 25 03/26/2024   GLUCOSE 271 (H) 03/26/2024   BUN 14 03/26/2024   CREATININE 0.88 03/26/2024   BILITOT 1.0 05/15/2024   ALKPHOS 88 05/15/2024   AST 79 (H) 05/15/2024   ALT 77 (H) 05/15/2024   PROT 6.7 05/15/2024   ALBUMIN 4.4 05/15/2024   CALCIUM  9.6 03/26/2024   EGFR 99 03/26/2024   GFR 97.83 12/24/2023   Lab Results  Component Value Date   CHOL 93 (L) 05/15/2024   Lab Results  Component Value Date  HDL 24 (L) 05/15/2024   Lab Results  Component Value Date   LDLCALC 39 05/15/2024   Lab Results  Component Value Date   TRIG 179 (H) 05/15/2024   Lab Results  Component Value Date   CHOLHDL 3.9 05/15/2024   Lab Results  Component Value Date   HGBA1C 8.3 (H) 03/26/2024       Assessment & Plan:  Type 2 diabetes mellitus with hyperglycemia, unspecified whether long term insulin  use (HCC) Assessment & Plan: Hgba1ccontinue to monitor, minimize simple carbs. Increase exercise as tolerated. Continue current meds    Muscle cramps Assessment & Plan: Hydrate and monitor    Low magnesium level Assessment & Plan: Supplement and monitor    Primary hypertension Assessment & Plan: Well controlled, no changes to meds. Encouraged heart healthy diet such as the DASH diet and exercise as tolerated.     Mixed hyperlipidemia Assessment & Plan: Tolerating statin, encouraged heart healthy diet, avoid trans fats, minimize simple carbs and saturated fats. Increase exercise as tolerated   Gastroesophageal reflux disease, unspecified whether esophagitis present Assessment & Plan: Avoid offending foods, start probiotics. Do not eat large meals in late evening and consider raising head of bed.       Assessment and Plan Assessment & Plan Type 2 diabetes mellitus with hyperglycemia Diabetes  management is challenging due to travel and dietary changes. No recent blood glucose monitoring. Potential for hyperglycemia due to dietary changes and stress. Discussed the importance of aligning activity level and dietary intake with medication regimen to prevent hyperglycemia and hypoglycemia. - Provided glucometer for blood glucose monitoring. - Encouraged protein intake every 3-4 hours and hydration with 5-10 ounces of water every 1-2 hours. - Discussed potential for PRN short-acting insulin  if hyperglycemia occurs. - Encouraged dietary adjustments to prevent hyperglycemia, including avoiding carbohydrates if blood glucose is high. - Discussed the importance of aligning activity level and dietary intake with medication regimen.  Primary hypertension Hypertension management is ongoing.  Mixed hyperlipidemia Hyperlipidemia management is ongoing.  Muscle cramps and abnormal blood mineral levels Muscle cramps likely due to dehydration and low magnesium levels. Discussed the importance of hydration and magnesium supplementation. Consideration of potassium and calcium  levels as contributing factors. - Encouraged hydration with 5-10 ounces of water every 1-2 hours. - Checked magnesium and potassium levels. - Consider topical magnesium for muscle cramps. - Discussed potential dietary sources of potassium.  Coronary artery disease risk (high coronary calcium  score) High coronary calcium  score indicating significant plaque buildup. Discussed the need for further evaluation with a nuclear stress test to assess myocardial perfusion under stress. Insurance coverage issues noted. Emphasized the importance of addressing risk factors such as diabetes and hypertension. - Contact [facility]'s nurse to discuss insurance coverage for nuclear stress test. - Continue aspirin 80 mg daily. - Monitor for symptoms of cardiac compromise, such as chest pain or shortness of breath.  General Health  Maintenance Discussed the importance of vaccinations, including shingles, flu, and RSV vaccines. Emphasized the potential benefits of these vaccines in reducing the risk of severe illness and dementia. Discussed the potential need for a Prevnar 20 booster in the future. - Consider shingles vaccine at discretion. - Consider annual flu vaccine. - Consider RSV vaccine if eligible. - Consider Prevnar 20 booster in the future.  Recording duration: 40 minutes     Harlene Horton, MD

## 2024-09-13 NOTE — Assessment & Plan Note (Signed)
 Supplement and monitor

## 2024-09-13 NOTE — Assessment & Plan Note (Signed)
 Hgba1ccontinue to monitor, minimize simple carbs. Increase exercise as tolerated. Continue current meds

## 2024-09-13 NOTE — Assessment & Plan Note (Signed)
Avoid offending foods, start probiotics. Do not eat large meals in late evening and consider raising head of bed.  

## 2024-09-13 NOTE — Assessment & Plan Note (Signed)
 Tolerating statin, encouraged heart healthy diet, avoid trans fats, minimize simple carbs and saturated fats. Increase exercise as tolerated

## 2024-09-13 NOTE — Assessment & Plan Note (Signed)
 Hydrate and monitor

## 2024-09-13 NOTE — Assessment & Plan Note (Signed)
 Well controlled, no changes to meds. Encouraged heart healthy diet such as the DASH diet and exercise as tolerated.

## 2024-09-14 ENCOUNTER — Encounter: Payer: Self-pay | Admitting: Family Medicine

## 2024-09-14 ENCOUNTER — Ambulatory Visit: Admitting: Family Medicine

## 2024-09-14 VITALS — BP 130/86 | HR 82 | Temp 97.7°F | Resp 16 | Ht 66.0 in | Wt 174.2 lb

## 2024-09-14 DIAGNOSIS — R252 Cramp and spasm: Secondary | ICD-10-CM | POA: Diagnosis not present

## 2024-09-14 DIAGNOSIS — E1165 Type 2 diabetes mellitus with hyperglycemia: Secondary | ICD-10-CM | POA: Diagnosis not present

## 2024-09-14 DIAGNOSIS — E782 Mixed hyperlipidemia: Secondary | ICD-10-CM | POA: Diagnosis not present

## 2024-09-14 DIAGNOSIS — R790 Abnormal level of blood mineral: Secondary | ICD-10-CM

## 2024-09-14 DIAGNOSIS — K219 Gastro-esophageal reflux disease without esophagitis: Secondary | ICD-10-CM

## 2024-09-14 DIAGNOSIS — I10 Essential (primary) hypertension: Secondary | ICD-10-CM

## 2024-09-14 NOTE — Patient Instructions (Addendum)
 Annual Covid and Flu vaccines RSV, Respiratory Syncitial Virus vaccine, Arexvy vaccine  Shingrix is the new shingles shot, 2 shots over 2-6 months, confirm coverage with insurance and document, then can return here for shots with nurse appt or at pharmacy   Prevnar 20 next year  Tetanus in 2028 or sooner if injured

## 2024-09-15 ENCOUNTER — Ambulatory Visit: Payer: Self-pay | Admitting: Family Medicine

## 2024-09-15 DIAGNOSIS — E1165 Type 2 diabetes mellitus with hyperglycemia: Secondary | ICD-10-CM

## 2024-09-15 LAB — CBC WITH DIFFERENTIAL/PLATELET
Basophils Absolute: 0 K/uL (ref 0.0–0.1)
Basophils Relative: 0.6 % (ref 0.0–3.0)
Eosinophils Absolute: 0.1 K/uL (ref 0.0–0.7)
Eosinophils Relative: 1.4 % (ref 0.0–5.0)
HCT: 35.5 % — ABNORMAL LOW (ref 39.0–52.0)
Hemoglobin: 12.7 g/dL — ABNORMAL LOW (ref 13.0–17.0)
Lymphocytes Relative: 17.9 % (ref 12.0–46.0)
Lymphs Abs: 0.7 K/uL (ref 0.7–4.0)
MCHC: 35.7 g/dL (ref 30.0–36.0)
MCV: 94.2 fl (ref 78.0–100.0)
Monocytes Absolute: 0.3 K/uL (ref 0.1–1.0)
Monocytes Relative: 7.4 % (ref 3.0–12.0)
Neutro Abs: 3 K/uL (ref 1.4–7.7)
Neutrophils Relative %: 72.7 % (ref 43.0–77.0)
Platelets: 226 K/uL (ref 150.0–400.0)
RBC: 3.77 Mil/uL — ABNORMAL LOW (ref 4.22–5.81)
RDW: 15.2 % (ref 11.5–15.5)
WBC: 4.2 K/uL (ref 4.0–10.5)

## 2024-09-15 LAB — MICROALBUMIN / CREATININE URINE RATIO
Creatinine,U: 39.9 mg/dL
Microalb Creat Ratio: 273.2 mg/g — ABNORMAL HIGH (ref 0.0–30.0)
Microalb, Ur: 10.9 mg/dL — ABNORMAL HIGH (ref 0.0–1.9)

## 2024-09-15 LAB — COMPREHENSIVE METABOLIC PANEL WITH GFR
ALT: 74 U/L — ABNORMAL HIGH (ref 0–53)
AST: 72 U/L — ABNORMAL HIGH (ref 0–37)
Albumin: 4.3 g/dL (ref 3.5–5.2)
Alkaline Phosphatase: 81 U/L (ref 39–117)
BUN: 16 mg/dL (ref 6–23)
CO2: 28 meq/L (ref 19–32)
Calcium: 9 mg/dL (ref 8.4–10.5)
Chloride: 103 meq/L (ref 96–112)
Creatinine, Ser: 0.82 mg/dL (ref 0.40–1.50)
GFR: 95.87 mL/min (ref >60.00)
Glucose, Bld: 287 mg/dL — ABNORMAL HIGH (ref 70–99)
Potassium: 4 meq/L (ref 3.5–5.1)
Sodium: 137 meq/L (ref 135–145)
Total Bilirubin: 1.1 mg/dL (ref 0.2–1.2)
Total Protein: 6.6 g/dL (ref 6.0–8.3)

## 2024-09-15 LAB — LIPID PANEL
Cholesterol: 89 mg/dL (ref 0–200)
HDL: 27.9 mg/dL — ABNORMAL LOW (ref >39.00)
LDL Cholesterol: 39 mg/dL (ref 0–99)
NonHDL: 61.08
Total CHOL/HDL Ratio: 3
Triglycerides: 111 mg/dL (ref 0.0–149.0)
VLDL: 22.2 mg/dL (ref 0.0–40.0)

## 2024-09-15 LAB — MAGNESIUM: Magnesium: 1.4 mg/dL — ABNORMAL LOW (ref 1.5–2.5)

## 2024-09-15 LAB — TSH: TSH: 1.36 u[IU]/mL (ref 0.35–5.50)

## 2024-09-15 LAB — HEMOGLOBIN A1C: Hgb A1c MFr Bld: 9.7 % — ABNORMAL HIGH (ref 4.6–6.5)

## 2024-09-15 MED ORDER — BLOOD GLUCOSE TEST VI STRP: 1.0000 | ORAL_STRIP | 2 refills | Status: AC

## 2024-09-15 MED ORDER — LANCETS MISC: 1.0000 | 2 refills | Status: AC

## 2024-09-15 MED ORDER — LANCET DEVICE MISC: 1.0000 | 0 refills | Status: AC

## 2024-09-15 MED ORDER — BLOOD GLUCOSE MONITORING SUPPL DEVI: 1.0000 | 0 refills | Status: AC

## 2024-09-16 ENCOUNTER — Encounter: Payer: Self-pay | Admitting: Family Medicine

## 2024-09-16 MED ORDER — BASAGLAR KWIKPEN 100 UNIT/ML ~~LOC~~ SOPN: 54.0000 [IU] | PEN_INJECTOR | Freq: Every day | SUBCUTANEOUS | 4 refills | Status: AC

## 2024-10-01 DIAGNOSIS — M545 Low back pain, unspecified: Secondary | ICD-10-CM | POA: Diagnosis not present

## 2024-10-01 DIAGNOSIS — M9904 Segmental and somatic dysfunction of sacral region: Secondary | ICD-10-CM | POA: Diagnosis not present

## 2024-10-01 DIAGNOSIS — M9901 Segmental and somatic dysfunction of cervical region: Secondary | ICD-10-CM | POA: Diagnosis not present

## 2024-10-01 DIAGNOSIS — M9903 Segmental and somatic dysfunction of lumbar region: Secondary | ICD-10-CM | POA: Diagnosis not present

## 2024-10-01 DIAGNOSIS — M9902 Segmental and somatic dysfunction of thoracic region: Secondary | ICD-10-CM | POA: Diagnosis not present

## 2024-10-05 ENCOUNTER — Other Ambulatory Visit: Payer: Self-pay | Admitting: Family Medicine

## 2024-10-05 DIAGNOSIS — M79671 Pain in right foot: Secondary | ICD-10-CM

## 2024-10-05 NOTE — Progress Notes (Unsigned)
 "        I, Ileana Collet, PhD, LAT, ATC acting as a scribe for [name], MD.  Curtis Kennedy is a 58 y.o. male who presents to [facility] today for exacerbation of his bilat ankle pain. Pt was previously seen by Dr. Lloyd on 07/06/24 and was given bilat anterior-lateral ankle steroid injections.   Today, pt reports bilat ankle pain started to return about a month ago. He has needed to re-start taking Celebrex  bid. Pain is still on the dorsum, slightly laterally of the foot/ankle.   He needs a Celebrex  refill.   Dx testing: 07/06/24 R & L ankle XR 12/26/23 R foot XR 09/10/23 R foot XR  Pertinent review of systems: No fevers or chills  Relevant historical information: Diabetes   Exam:  BP 132/76   Pulse 93   Ht 5' 6 (1.676 m)   Wt 172 lb (78 kg)   SpO2 97%   BMI 27.76 kg/m  General: Well Developed, well nourished, and in no acute distress.   MSK: Bilateral ankles are normal-appearing normal motion.    Lab and Radiology Results  Procedure: Real-time Ultrasound Guided Injection of right lateral ankle joint Device: Philips Affiniti 50G/GE Logiq Images permanently stored and available for review in PACS Verbal informed consent obtained.  Discussed risks and benefits of procedure. Warned about infection, bleeding, hyperglycemia damage to structures among others. Patient expresses understanding and agreement Time-out conducted.   Noted no overlying erythema, induration, or other signs of local infection.   Skin prepped in a sterile fashion.   Local anesthesia: Topical Ethyl chloride.   With sterile technique and under real time ultrasound guidance: 40 mg of Kenalog and 2 mL of Marcaine injected into lateral ankle joint. Fluid seen entering the joint capsule.   Completed without difficulty   Pain immediately resolved suggesting accurate placement of the medication.   Advised to call if fevers/chills, erythema, induration, drainage, or persistent  bleeding.   Images permanently stored and available for review in the ultrasound unit.  Impression: Technically successful ultrasound guided injection.    Procedure: Real-time Ultrasound Guided Injection of left lateral ankle joint Device: Philips Affiniti 50G/GE Logiq Images permanently stored and available for review in PACS Verbal informed consent obtained.  Discussed risks and benefits of procedure. Warned about infection, bleeding, hyperglycemia damage to structures among others. Patient expresses understanding and agreement Time-out conducted.   Noted no overlying erythema, induration, or other signs of local infection.   Skin prepped in a sterile fashion.   Local anesthesia: Topical Ethyl chloride.   With sterile technique and under real time ultrasound guidance: 40 mg of Kenalog and 2 mL of Marcaine injected into lateral ankle joint. Fluid seen entering the joint capsule.   Completed without difficulty   Pain immediately resolved suggesting accurate placement of the medication.   Advised to call if fevers/chills, erythema, induration, drainage, or persistent bleeding.   Images permanently stored and available for review in the ultrasound unit.  Impression: Technically successful ultrasound guided injection.         Assessment and Plan: 58 y.o. male with bilateral ankle pain.  Etiology is somewhat unclear but he did respond very well to steroid injections 3 months ago.  Plan for repeat injection today and refill of Celebrex .  If today's injection does not last very long would recommend MRI as next step.   PDMP not reviewed this encounter. Orders Placed This Encounter  Procedures   US   LIMITED JOINT SPACE STRUCTURES LOW BILAT(NO LINKED CHARGES)    Reason for Exam (SYMPTOM  OR DIAGNOSIS REQUIRED):   bilateral ankle pain    Preferred imaging location?:   [facility] ordered this encounter  Medications   celecoxib  (CELEBREX ) 100 MG capsule     Sig: Take 1 capsule (100 mg total) by mouth 2 (two) times daily as needed.    Dispense:  60 capsule    Refill:  0     Discussed warning signs or symptoms. Please see discharge instructions. Patient expresses understanding.   The above documentation has been reviewed and is accurate and complete [name], M.D.   "

## 2024-10-06 ENCOUNTER — Ambulatory Visit: Payer: Self-pay | Admitting: Family Medicine

## 2024-10-06 ENCOUNTER — Other Ambulatory Visit: Payer: Self-pay

## 2024-10-06 DIAGNOSIS — M79671 Pain in right foot: Secondary | ICD-10-CM

## 2024-10-06 DIAGNOSIS — M79672 Pain in left foot: Secondary | ICD-10-CM | POA: Diagnosis not present

## 2024-10-06 MED ORDER — CELECOXIB 100 MG PO CAPS: 100.0000 mg | ORAL_CAPSULE | Freq: Two times a day (BID) | ORAL | 0 refills | Status: DC | PRN

## 2024-10-06 NOTE — Telephone Encounter (Signed)
 Last OV 07/06/24 Next OV 10/06/24  Last refill 09/09/24 Qty #60/0   Renal labs 09/14/24  Component Ref Range & Units (hover) 3 wk ago (09/14/24) 4 mo ago (05/15/24) 6 mo ago (03/26/24) 9 mo ago (12/24/23) 1 yr ago (09/10/23) 1 yr ago (03/12/23) 1 yr ago (12/10/22)  Sodium 137  137 R 138 138 140 137  Potassium 4.0  4.0 R 3.9 4.3 4.1 4.5  Chloride 103  100 R 103 105 105 103  CO2 28  25 R 27 26 27 25   Comment: Elevated LDH levels may cause falsely increased CO2 results. If LDH is >2000 U/L, a positive bias of 12% is possible.  Glucose, Bld 287 High   271 High  R, CM 292 High  188 High  153 High  270 High   BUN 16  14 R 10 9 10 14   Creatinine, Ser 0.82  0.88 R 0.78 0.77 0.86 0.92  Total Bilirubin 1.1 1.0 R 1.6 High  0.8 1.0 0.9 0.8  Alkaline Phosphatase 81 88 R  69 57 52 77  AST 72 High  79 High  R 166 High  R 31 31 30 26   ALT 74 High  77 High  R 154 High  R 31 32 37 48  Total Protein 6.6 6.7 R 7.6 R 6.6 6.4 6.8 7.1  Albumin 4.3 4.4 R  4.4 4.2 4.2 4.4  GFR 95.87   97.83 CM 98.41 CM 95.51 CM 91.92 CM  Comment: Calculated using the CKD-EPI Creatinine Equation (2021)  Calcium  9.0  9.6 R 9.6 9.0 9.0 9.8

## 2024-10-06 NOTE — Patient Instructions (Addendum)
 Thank you for coming in today.   You received an injection today. Seek immediate medical attention if the joint becomes red, extremely painful, or is oozing fluid.   Check back as needed  Happy Holidays!

## 2024-10-12 DIAGNOSIS — M545 Low back pain, unspecified: Secondary | ICD-10-CM | POA: Diagnosis not present

## 2024-10-12 DIAGNOSIS — M9904 Segmental and somatic dysfunction of sacral region: Secondary | ICD-10-CM | POA: Diagnosis not present

## 2024-10-12 DIAGNOSIS — M9901 Segmental and somatic dysfunction of cervical region: Secondary | ICD-10-CM | POA: Diagnosis not present

## 2024-10-12 DIAGNOSIS — M9902 Segmental and somatic dysfunction of thoracic region: Secondary | ICD-10-CM | POA: Diagnosis not present

## 2024-10-12 DIAGNOSIS — M9903 Segmental and somatic dysfunction of lumbar region: Secondary | ICD-10-CM | POA: Diagnosis not present

## 2024-10-14 ENCOUNTER — Encounter: Payer: Self-pay | Admitting: Cardiology

## 2024-10-14 DIAGNOSIS — I2510 Atherosclerotic heart disease of native coronary artery without angina pectoris: Secondary | ICD-10-CM

## 2024-10-14 DIAGNOSIS — R931 Abnormal findings on diagnostic imaging of heart and coronary circulation: Secondary | ICD-10-CM

## 2024-10-21 ENCOUNTER — Encounter: Payer: Self-pay | Admitting: Cardiology

## 2024-10-26 ENCOUNTER — Telehealth: Payer: Self-pay | Admitting: Family Medicine

## 2024-10-26 NOTE — Telephone Encounter (Signed)
 Pt came in wanting to go ahead and get a prior authorization for Trulicity  medication. Please call pt with any questions and when it has been complete.

## 2024-10-27 ENCOUNTER — Telehealth: Payer: Self-pay

## 2024-10-27 ENCOUNTER — Other Ambulatory Visit (HOSPITAL_COMMUNITY): Payer: Self-pay

## 2024-10-27 NOTE — Telephone Encounter (Signed)
 Pharmacy Patient Advocate Encounter   Received notification from [facility] that prior authorization for Trulicity  is required/requested.   Insurance verification completed.   The patient is insured through [facility].   Per test claim: PA required; PA submitted to above mentioned insurance via Latent Key/confirmation #/EOC [id number] Status is pending

## 2024-11-02 ENCOUNTER — Other Ambulatory Visit (HOSPITAL_COMMUNITY): Payer: Self-pay

## 2024-11-02 ENCOUNTER — Other Ambulatory Visit: Payer: Self-pay | Admitting: Family Medicine

## 2024-11-02 DIAGNOSIS — E119 Type 2 diabetes mellitus without complications: Secondary | ICD-10-CM

## 2024-11-02 DIAGNOSIS — I10 Essential (primary) hypertension: Secondary | ICD-10-CM

## 2024-11-02 DIAGNOSIS — M79671 Pain in right foot: Secondary | ICD-10-CM

## 2024-11-02 DIAGNOSIS — E785 Hyperlipidemia, unspecified: Secondary | ICD-10-CM

## 2024-11-02 NOTE — Telephone Encounter (Signed)
 Pharmacy Patient Advocate Encounter  Received notification from [facility] that Prior Authorization for Trulicity  has been APPROVED from 10/30/2024 to 10/31/2024. Ran test claim, Copay is $25. This test claim was processed through [facility]- copay amounts may vary at other pharmacies due to pharmacy/plan contracts, or as the patient moves through the different stages of their insurance plan.   PA #/Case ID/Reference #: [id number]

## 2024-11-03 NOTE — Telephone Encounter (Signed)
 Last OV 10/06/24 Next OV not scheduled  Last refill 10/06/24 Qty #60/0   Renal labs 09/14/24  Component Ref Range & Units (hover) 1 mo ago (09/14/24) 5 mo ago (05/15/24) 7 mo ago (03/26/24) 10 mo ago (12/24/23) 1 yr ago (09/10/23) 1 yr ago (03/12/23) 1 yr ago (12/10/22)  Sodium 137  137 R 138 138 140 137  Potassium 4.0  4.0 R 3.9 4.3 4.1 4.5  Chloride 103  100 R 103 105 105 103  CO2 28  25 R 27 26 27 25   Comment: Elevated LDH levels may cause falsely increased CO2 results. If LDH is >2000 U/L, a positive bias of 12% is possible.  Glucose, Bld 287 High   271 High  R, CM 292 High  188 High  153 High  270 High   BUN 16  14 R 10 9 10 14   Creatinine, Ser 0.82  0.88 R 0.78 0.77 0.86 0.92  Total Bilirubin 1.1 1.0 R 1.6 High  0.8 1.0 0.9 0.8  Alkaline Phosphatase 81 88 R  69 57 52 77  AST 72 High  79 High  R 166 High  R 31 31 30 26   ALT 74 High  77 High  R 154 High  R 31 32 37 48  Total Protein 6.6 6.7 R 7.6 R 6.6 6.4 6.8 7.1  Albumin 4.3 4.4 R  4.4 4.2 4.2 4.4  GFR 95.87   97.83 CM 98.41 CM 95.51 CM 91.92 CM  Comment: Calculated using the CKD-EPI Creatinine Equation (2021)  Calcium  9.0  9.6 R 9.6 9.0 9.0 9.8

## 2024-11-11 ENCOUNTER — Encounter (HOSPITAL_COMMUNITY)

## 2024-11-19 ENCOUNTER — Ambulatory Visit: Admitting: Family Medicine

## 2024-12-21 ENCOUNTER — Ambulatory Visit: Admitting: Medical

## 2024-12-24 ENCOUNTER — Ambulatory Visit: Admitting: Family Medicine

## 2025-02-08 ENCOUNTER — Ambulatory Visit: Admitting: Family Medicine

## 2025-03-09 ENCOUNTER — Encounter: Admitting: Medical

## 2025-03-15 ENCOUNTER — Other Ambulatory Visit (HOSPITAL_COMMUNITY)
